# Patient Record
Sex: Male | Born: 1955 | Race: Black or African American | Hispanic: No | Marital: Single | Smoking: Former smoker
Health system: Southern US, Community
[De-identification: ages and names within clinical notes are randomized; demographics above are authoritative.]

## PROBLEM LIST (undated history)

## (undated) DIAGNOSIS — I5022 Chronic systolic (congestive) heart failure: Secondary | ICD-10-CM

## (undated) DIAGNOSIS — I10 Essential (primary) hypertension: Secondary | ICD-10-CM

## (undated) DIAGNOSIS — I428 Other cardiomyopathies: Secondary | ICD-10-CM

## (undated) DIAGNOSIS — N189 Chronic kidney disease, unspecified: Secondary | ICD-10-CM

## (undated) HISTORY — PX: SHOULDER SURGERY: SHX246

## (undated) HISTORY — PX: KNEE SURGERY: SHX244

## (undated) HISTORY — DX: Chronic kidney disease, unspecified: N18.9

## (undated) HISTORY — DX: Chronic systolic (congestive) heart failure: I50.22

## (undated) HISTORY — DX: Other cardiomyopathies: I42.8

---

## 2019-09-19 ENCOUNTER — Inpatient Hospital Stay (HOSPITAL_COMMUNITY)
Admission: EM | Admit: 2019-09-19 | Discharge: 2019-10-03 | DRG: 286 | Disposition: A | Discharge status: Skilled Nursing Facility | Payer: BLUE CROSS/BLUE SHIELD | Attending: Internal Medicine | Admitting: Internal Medicine

## 2019-09-19 ENCOUNTER — Encounter (HOSPITAL_COMMUNITY): Payer: Self-pay

## 2019-09-19 ENCOUNTER — Other Ambulatory Visit: Payer: Self-pay

## 2019-09-19 ENCOUNTER — Emergency Department (HOSPITAL_COMMUNITY): Payer: BLUE CROSS/BLUE SHIELD

## 2019-09-19 DIAGNOSIS — I472 Ventricular tachycardia: Secondary | ICD-10-CM | POA: Diagnosis not present

## 2019-09-19 DIAGNOSIS — R52 Pain, unspecified: Secondary | ICD-10-CM

## 2019-09-19 DIAGNOSIS — N1831 Chronic kidney disease, stage 3a: Secondary | ICD-10-CM | POA: Diagnosis present

## 2019-09-19 DIAGNOSIS — Z6841 Body Mass Index (BMI) 40.0 and over, adult: Secondary | ICD-10-CM

## 2019-09-19 DIAGNOSIS — M25529 Pain in unspecified elbow: Secondary | ICD-10-CM

## 2019-09-19 DIAGNOSIS — M25461 Effusion, right knee: Secondary | ICD-10-CM

## 2019-09-19 DIAGNOSIS — R188 Other ascites: Secondary | ICD-10-CM | POA: Diagnosis present

## 2019-09-19 DIAGNOSIS — R0602 Shortness of breath: Secondary | ICD-10-CM | POA: Diagnosis not present

## 2019-09-19 DIAGNOSIS — I420 Dilated cardiomyopathy: Secondary | ICD-10-CM | POA: Diagnosis present

## 2019-09-19 DIAGNOSIS — R7989 Other specified abnormal findings of blood chemistry: Secondary | ICD-10-CM

## 2019-09-19 DIAGNOSIS — Z833 Family history of diabetes mellitus: Secondary | ICD-10-CM

## 2019-09-19 DIAGNOSIS — E873 Alkalosis: Secondary | ICD-10-CM | POA: Diagnosis present

## 2019-09-19 DIAGNOSIS — Z87891 Personal history of nicotine dependence: Secondary | ICD-10-CM

## 2019-09-19 DIAGNOSIS — I272 Pulmonary hypertension, unspecified: Secondary | ICD-10-CM | POA: Diagnosis present

## 2019-09-19 DIAGNOSIS — N179 Acute kidney failure, unspecified: Secondary | ICD-10-CM

## 2019-09-19 DIAGNOSIS — I5082 Biventricular heart failure: Secondary | ICD-10-CM | POA: Diagnosis present

## 2019-09-19 DIAGNOSIS — Z79899 Other long term (current) drug therapy: Secondary | ICD-10-CM

## 2019-09-19 DIAGNOSIS — R339 Retention of urine, unspecified: Secondary | ICD-10-CM | POA: Diagnosis present

## 2019-09-19 DIAGNOSIS — I5043 Acute on chronic combined systolic (congestive) and diastolic (congestive) heart failure: Secondary | ICD-10-CM | POA: Diagnosis present

## 2019-09-19 DIAGNOSIS — Z9114 Patient's other noncompliance with medication regimen: Secondary | ICD-10-CM

## 2019-09-19 DIAGNOSIS — R32 Unspecified urinary incontinence: Secondary | ICD-10-CM | POA: Diagnosis present

## 2019-09-19 DIAGNOSIS — I509 Heart failure, unspecified: Secondary | ICD-10-CM

## 2019-09-19 DIAGNOSIS — J9621 Acute and chronic respiratory failure with hypoxia: Secondary | ICD-10-CM | POA: Diagnosis present

## 2019-09-19 DIAGNOSIS — Z20822 Contact with and (suspected) exposure to covid-19: Secondary | ICD-10-CM | POA: Diagnosis present

## 2019-09-19 DIAGNOSIS — I5021 Acute systolic (congestive) heart failure: Secondary | ICD-10-CM

## 2019-09-19 DIAGNOSIS — I10 Essential (primary) hypertension: Secondary | ICD-10-CM

## 2019-09-19 DIAGNOSIS — M109 Gout, unspecified: Secondary | ICD-10-CM | POA: Diagnosis present

## 2019-09-19 DIAGNOSIS — N289 Disorder of kidney and ureter, unspecified: Secondary | ICD-10-CM

## 2019-09-19 DIAGNOSIS — I130 Hypertensive heart and chronic kidney disease with heart failure and stage 1 through stage 4 chronic kidney disease, or unspecified chronic kidney disease: Secondary | ICD-10-CM | POA: Diagnosis not present

## 2019-09-19 DIAGNOSIS — E662 Morbid (severe) obesity with alveolar hypoventilation: Secondary | ICD-10-CM | POA: Diagnosis present

## 2019-09-19 DIAGNOSIS — E875 Hyperkalemia: Secondary | ICD-10-CM | POA: Diagnosis present

## 2019-09-19 DIAGNOSIS — E876 Hypokalemia: Secondary | ICD-10-CM | POA: Diagnosis present

## 2019-09-19 DIAGNOSIS — J9622 Acute and chronic respiratory failure with hypercapnia: Secondary | ICD-10-CM | POA: Diagnosis present

## 2019-09-19 DIAGNOSIS — R17 Unspecified jaundice: Secondary | ICD-10-CM

## 2019-09-19 HISTORY — DX: Essential (primary) hypertension: I10

## 2019-09-19 HISTORY — DX: Morbid (severe) obesity due to excess calories: E66.01

## 2019-09-19 LAB — CBC
HCT: 51.2 % (ref 39.0–52.0)
Hemoglobin: 16 g/dL (ref 13.0–17.0)
MCH: 28.7 pg (ref 26.0–34.0)
MCHC: 31.3 g/dL (ref 30.0–36.0)
MCV: 91.8 fL (ref 80.0–100.0)
Platelets: 288 10*3/uL (ref 150–400)
RBC: 5.58 MIL/uL (ref 4.22–5.81)
RDW: 16.6 % — ABNORMAL HIGH (ref 11.5–15.5)
WBC: 5.1 10*3/uL (ref 4.0–10.5)
nRBC: 0 % (ref 0.0–0.2)

## 2019-09-19 LAB — COMPREHENSIVE METABOLIC PANEL
ALT: 21 U/L (ref 0–44)
AST: 44 U/L — ABNORMAL HIGH (ref 15–41)
Albumin: 3.7 g/dL (ref 3.5–5.0)
Alkaline Phosphatase: 66 U/L (ref 38–126)
Anion gap: 10 (ref 5–15)
BUN: 26 mg/dL — ABNORMAL HIGH (ref 8–23)
CO2: 27 mmol/L (ref 22–32)
Calcium: 8.7 mg/dL — ABNORMAL LOW (ref 8.9–10.3)
Chloride: 102 mmol/L (ref 98–111)
Creatinine, Ser: 2.07 mg/dL — ABNORMAL HIGH (ref 0.61–1.24)
GFR calc Af Amer: 39 mL/min — ABNORMAL LOW (ref >60)
GFR calc non Af Amer: 34 mL/min — ABNORMAL LOW (ref >60)
Glucose, Bld: 79 mg/dL (ref 70–99)
Potassium: 5.4 mmol/L — ABNORMAL HIGH (ref 3.5–5.1)
Sodium: 139 mmol/L (ref 135–145)
Total Bilirubin: 2.1 mg/dL — ABNORMAL HIGH (ref 0.3–1.2)
Total Protein: 7.3 g/dL (ref 6.5–8.1)

## 2019-09-19 LAB — LIPASE, BLOOD: Lipase: 36 U/L (ref 11–51)

## 2019-09-19 LAB — BRAIN NATRIURETIC PEPTIDE: B Natriuretic Peptide: 1446.6 pg/mL — ABNORMAL HIGH (ref 0.0–100.0)

## 2019-09-19 MED ORDER — FUROSEMIDE 10 MG/ML IJ SOLN
40.0000 mg | Freq: Once | INTRAMUSCULAR | Status: AC
  Administered 2019-09-19: 40 mg via INTRAVENOUS
  Filled 2019-09-19: qty 4

## 2019-09-19 MED ORDER — SODIUM ZIRCONIUM CYCLOSILICATE 10 G PO PACK
10.0000 g | PACK | Freq: Every day | ORAL | Status: DC
  Administered 2019-09-20 – 2019-09-21 (×2): 10 g via ORAL
  Filled 2019-09-19 (×4): qty 1

## 2019-09-19 NOTE — ED Notes (Signed)
Patient assisted back to room.

## 2019-09-19 NOTE — ED Notes (Addendum)
Patient still in bathroom. Will continue to monitor.

## 2019-09-19 NOTE — ED Provider Notes (Signed)
[facility] DEPT Provider Note   CSN: [id number] Arrival date & time: 09/19/19  1940     History Chief Complaint  Patient presents with  . Diarrhea  . Leg Swelling  . Abnormal ECG    Jack Chavez is a 64 y.o. male.  The history is provided by the patient. No language interpreter was used.     64 year old male presenting for evaluation of swelling.  Patient report for the past 2 weeks he has had some recurrent diarrhea and sensation of indigestion.  Furthermore, he also noticed increased fluid retention around his abdomen and his lower extremities.  Clothes are feeling tight.  Endorsed mild shortness of breath with movement.  Does not complain of any fever chills no lightheadedness or dizziness no chest pain no productive cough not really any significant pain.  He admits he has been without his diuretic for the past 1 month.  He was taking HCTZ.  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  He denies any alcohol abuse tobacco abuse.  He denies any significant cardiac disease.  He went to urgent care today for evaluation of swelling but was told that they were unable to obtain his blood work and his EKG is abnormal and recommend patient come to the ER.  Denies any nausea or vomiting.  History reviewed. No pertinent past medical history.  There are no problems to display for this patient.   History reviewed. No pertinent surgical history.     History reviewed. No pertinent family history.  Social History   Tobacco Use  . Smoking status: Not on file  Substance Use Topics  . Alcohol use: Not on file  . Drug use: Not on file    Home Medications Prior to Admission medications   Not on File    Allergies    Patient has no allergy information on record.  Review of Systems   Review of Systems  All other systems reviewed and are negative.   Physical Exam Updated Vital Signs BP (!) 180/119 (BP Location:  Right Wrist)   Pulse 98   Temp 97.8 F (36.6 C) (Oral)   Resp (!) 22   SpO2 98%   Physical Exam Vitals and nursing note reviewed.  Constitutional:      General: He is not in acute distress.    Appearance: He is well-developed. He is obese.  HENT:     Head: Atraumatic.  Eyes:     Conjunctiva/sclera: Conjunctivae normal.  Neck:     Comments: No JVD Cardiovascular:     Rate and Rhythm: Normal rate and regular rhythm.     Pulses: Normal pulses.     Heart sounds: Normal heart sounds.  Pulmonary:     Effort: Pulmonary effort is normal.     Breath sounds: Normal breath sounds.     Comments: Decreased breath sounds without any overt wheezes, rales, or rhonchi Abdominal:     General: There is distension.     Tenderness: There is no abdominal tenderness.     Comments: Distended abdomen without tenderness.  Reducible umbilical hernia.  Musculoskeletal:        General: Swelling (2+ pitting edema to bilateral lower extremity extending towards the knee.  Intact dorsalis pedis pulse.) present.     Cervical back: Neck supple.  Skin:    Findings: No rash.  Neurological:     Mental Status: He is alert.     ED  Results / Procedures / Treatments   Labs (all labs ordered are listed, but only abnormal results are displayed) Labs Reviewed  COMPREHENSIVE METABOLIC PANEL - Abnormal; Notable for the following components:      Result Value   Potassium 5.4 (*)    BUN 26 (*)    Creatinine, Ser 2.07 (*)    Calcium 8.7 (*)    AST 44 (*)    Total Bilirubin 2.1 (*)    GFR calc non Af Amer 34 (*)    GFR calc Af Amer 39 (*)    All other components within normal limits  CBC - Abnormal; Notable for the following components:   RDW 16.6 (*)    All other components within normal limits  BRAIN NATRIURETIC PEPTIDE - Abnormal; Notable for the following components:   B Natriuretic Peptide 1,446.6 (*)    All other components within normal limits  SARS CORONAVIRUS 2 (TAT 6-24 HRS)  LIPASE, BLOOD    URINALYSIS, ROUTINE W REFLEX MICROSCOPIC    EKG EKG Interpretation  Date/Time:  Wednesday September 19 2019 20:28:30 EDT Ventricular Rate:  95 PR Interval:    QRS Duration: 103 QT Interval:  376 QTC Calculation: 473 R Axis:   175 Text Interpretation: Sinus rhythm Anterior infarct, old No old tracing to compare Confirmed by Meridee Score [phone]) on 09/19/2019 8:54:12 PM   Radiology DG Chest Portable 1 View  Result Date: 09/19/2019 CLINICAL DATA:  Fluid retention, edema EXAM: PORTABLE CHEST 1 VIEW COMPARISON:  None FINDINGS: Cardiomegaly. Right lower lobe airspace opacity with probable layering effusion. No confluent opacity on the left. No acute bony abnormality. IMPRESSION: Layering right pleural effusion with right lower lobe airspace opacity which could reflect atelectasis or pneumonia. Cardiomegaly. Electronically Signed   By: Charlett Nose M.D.   On: 09/19/2019 21:33    Procedures Procedures (including critical care time)  Medications Ordered in ED Medications  sodium zirconium cyclosilicate (LOKELMA) packet 10 g (10 g Oral Given 09/20/19 0005)  furosemide (LASIX) injection 40 mg (40 mg Intravenous Given 09/19/19 2220)    ED Course  I have reviewed the triage vital signs and the nursing notes.  Pertinent labs & imaging results that were available during my care of the patient were reviewed by me and considered in my medical decision making (see chart for details).  Clinical Course as of Mar 25 0002  Wed Sep 19, 2019  7432 64 year old male with history of fluid retention here with increased peripheral edema through his legs and abdomen and increased shortness of breath.  Distant lung sounds.  Morbidly obese.  Moderate edema of legs with some erythema.  Afebrile and normal white count.  BNP elevated and chest x-ray showing signs of pleural effusion and fluid overload.  Will need admission for diuresis and further work-up.   [MB]    Clinical Course User Index [MB] Terrilee Files, MD   MDM Rules/Calculators/A&P                      BP (!) 180/119 (BP Location: Right Wrist)   Pulse 98   Temp 97.8 F (36.6 C) (Oral)   Resp (!) 22   SpO2 98%   Final Clinical Impression(s) / ED Diagnoses Final diagnoses:  Acute on chronic congestive heart failure, unspecified heart failure type (HCC)    Rx / DC Orders ED Discharge Orders    None     9:20 PM Patient report fluid retention around his abdomen and his  lower extremities for over a week.  Patient appears to be fluid overload, suspect component of CHF.  No known history of CHF in the past.  Has not been taking his diuretic for the past month due to no current PCP.  He is from [place].  Recently moved here.  No active chest pain.  Diarrhea as mild and recurrent no recent antibiotic use.  Care discussed with Dr. Charm Barges.   12:05 AM Labs remarkable for an elevated potassium of 5.4, will give Lokelma.  BUN is 26, creatinine of 2.07, no prior value for comparison.  He has a markedly elevated BNP of 1446.  Chest x-ray shows layering right pleural effusion with right lower lobe airspace opacity which could reflect atelectasis versus pneumonia.  Patient does not endorse any cough or fever to suggest pneumonia.  Since he does not have a PCP and currently presenting with signs of CHF exacerbation, will consult for admission.  Lasix given.  Patient voiced understanding and agrees with plan.  COVID-19 screening test ordered.  Jack Chavez was evaluated in Emergency Department on 09/20/2019 for the symptoms described in the history of present illness. He was evaluated in the context of the global COVID-19 pandemic, which necessitated consideration that the patient might be at risk for infection with the SARS-CoV-2 virus that causes COVID-19. Institutional protocols and algorithms that pertain to the evaluation of patients at risk for COVID-19 are in a state of rapid change based on information released by regulatory bodies  including the CDC and federal and state organizations. These policies and algorithms were followed during the patient's care in the ED.  12:16 AM Pt sign out to oncoming team who will consult medicine for admission.    Fayrene Helper, PA-C 09/20/19 0017    Terrilee Files, MD 09/20/19 1005

## 2019-09-19 NOTE — ED Triage Notes (Signed)
Pt reports he has been retaining fluid x3 weeks. Pt reports going to a clinic prior to coming here and they sent him here due to being unable to get blood work, has low urine output, and an abnormal EKG.

## 2019-09-19 NOTE — ED Notes (Addendum)
Patient assisted to bathroom in wheelchair. Will continue to monitor.

## 2019-09-19 NOTE — ED Notes (Signed)
This RN attempted to establish a line unsuccessfully. Another RN will attempt at this time. Will continue to monitor.

## 2019-09-20 ENCOUNTER — Inpatient Hospital Stay (HOSPITAL_COMMUNITY): Payer: BLUE CROSS/BLUE SHIELD

## 2019-09-20 ENCOUNTER — Encounter (HOSPITAL_COMMUNITY): Payer: Self-pay | Admitting: Internal Medicine

## 2019-09-20 DIAGNOSIS — R188 Other ascites: Secondary | ICD-10-CM | POA: Diagnosis present

## 2019-09-20 DIAGNOSIS — R17 Unspecified jaundice: Secondary | ICD-10-CM

## 2019-09-20 DIAGNOSIS — E875 Hyperkalemia: Secondary | ICD-10-CM | POA: Diagnosis present

## 2019-09-20 DIAGNOSIS — E662 Morbid (severe) obesity with alveolar hypoventilation: Secondary | ICD-10-CM | POA: Diagnosis present

## 2019-09-20 DIAGNOSIS — Z20822 Contact with and (suspected) exposure to covid-19: Secondary | ICD-10-CM | POA: Diagnosis present

## 2019-09-20 DIAGNOSIS — J9622 Acute and chronic respiratory failure with hypercapnia: Secondary | ICD-10-CM | POA: Diagnosis present

## 2019-09-20 DIAGNOSIS — I5023 Acute on chronic systolic (congestive) heart failure: Secondary | ICD-10-CM | POA: Diagnosis not present

## 2019-09-20 DIAGNOSIS — R0602 Shortness of breath: Secondary | ICD-10-CM | POA: Diagnosis present

## 2019-09-20 DIAGNOSIS — I509 Heart failure, unspecified: Secondary | ICD-10-CM | POA: Diagnosis not present

## 2019-09-20 DIAGNOSIS — M25461 Effusion, right knee: Secondary | ICD-10-CM | POA: Diagnosis not present

## 2019-09-20 DIAGNOSIS — J9621 Acute and chronic respiratory failure with hypoxia: Secondary | ICD-10-CM | POA: Diagnosis present

## 2019-09-20 DIAGNOSIS — E876 Hypokalemia: Secondary | ICD-10-CM

## 2019-09-20 DIAGNOSIS — Z79899 Other long term (current) drug therapy: Secondary | ICD-10-CM | POA: Diagnosis not present

## 2019-09-20 DIAGNOSIS — E873 Alkalosis: Secondary | ICD-10-CM | POA: Diagnosis present

## 2019-09-20 DIAGNOSIS — I130 Hypertensive heart and chronic kidney disease with heart failure and stage 1 through stage 4 chronic kidney disease, or unspecified chronic kidney disease: Secondary | ICD-10-CM | POA: Diagnosis present

## 2019-09-20 DIAGNOSIS — I5021 Acute systolic (congestive) heart failure: Secondary | ICD-10-CM | POA: Diagnosis not present

## 2019-09-20 DIAGNOSIS — N289 Disorder of kidney and ureter, unspecified: Secondary | ICD-10-CM

## 2019-09-20 DIAGNOSIS — R339 Retention of urine, unspecified: Secondary | ICD-10-CM | POA: Diagnosis present

## 2019-09-20 DIAGNOSIS — I10 Essential (primary) hypertension: Secondary | ICD-10-CM | POA: Diagnosis not present

## 2019-09-20 DIAGNOSIS — Z833 Family history of diabetes mellitus: Secondary | ICD-10-CM | POA: Diagnosis not present

## 2019-09-20 DIAGNOSIS — Z6841 Body Mass Index (BMI) 40.0 and over, adult: Secondary | ICD-10-CM | POA: Diagnosis not present

## 2019-09-20 DIAGNOSIS — N179 Acute kidney failure, unspecified: Secondary | ICD-10-CM | POA: Diagnosis present

## 2019-09-20 DIAGNOSIS — I5043 Acute on chronic combined systolic (congestive) and diastolic (congestive) heart failure: Secondary | ICD-10-CM | POA: Diagnosis present

## 2019-09-20 DIAGNOSIS — I472 Ventricular tachycardia: Secondary | ICD-10-CM | POA: Diagnosis not present

## 2019-09-20 DIAGNOSIS — N170 Acute kidney failure with tubular necrosis: Secondary | ICD-10-CM | POA: Diagnosis not present

## 2019-09-20 DIAGNOSIS — I420 Dilated cardiomyopathy: Secondary | ICD-10-CM | POA: Diagnosis present

## 2019-09-20 DIAGNOSIS — Z87891 Personal history of nicotine dependence: Secondary | ICD-10-CM | POA: Diagnosis not present

## 2019-09-20 DIAGNOSIS — I272 Pulmonary hypertension, unspecified: Secondary | ICD-10-CM | POA: Diagnosis present

## 2019-09-20 DIAGNOSIS — M109 Gout, unspecified: Secondary | ICD-10-CM | POA: Diagnosis present

## 2019-09-20 DIAGNOSIS — N1831 Chronic kidney disease, stage 3a: Secondary | ICD-10-CM | POA: Diagnosis present

## 2019-09-20 DIAGNOSIS — Z9114 Patient's other noncompliance with medication regimen: Secondary | ICD-10-CM | POA: Diagnosis not present

## 2019-09-20 DIAGNOSIS — R32 Unspecified urinary incontinence: Secondary | ICD-10-CM | POA: Diagnosis present

## 2019-09-20 DIAGNOSIS — I5082 Biventricular heart failure: Secondary | ICD-10-CM | POA: Diagnosis present

## 2019-09-20 LAB — URINALYSIS, ROUTINE W REFLEX MICROSCOPIC
Bilirubin Urine: NEGATIVE
Glucose, UA: NEGATIVE mg/dL
Ketones, ur: NEGATIVE mg/dL
Leukocytes,Ua: NEGATIVE
Nitrite: NEGATIVE
Protein, ur: 100 mg/dL — AB
Specific Gravity, Urine: 1.009 (ref 1.005–1.030)
pH: 5 (ref 5.0–8.0)

## 2019-09-20 LAB — RENAL FUNCTION PANEL
Albumin: 3.2 g/dL — ABNORMAL LOW (ref 3.5–5.0)
Anion gap: 8 (ref 5–15)
BUN: 28 mg/dL — ABNORMAL HIGH (ref 8–23)
CO2: 32 mmol/L (ref 22–32)
Calcium: 8.5 mg/dL — ABNORMAL LOW (ref 8.9–10.3)
Chloride: 102 mmol/L (ref 98–111)
Creatinine, Ser: 1.95 mg/dL — ABNORMAL HIGH (ref 0.61–1.24)
GFR calc Af Amer: 42 mL/min — ABNORMAL LOW (ref >60)
GFR calc non Af Amer: 36 mL/min — ABNORMAL LOW (ref >60)
Glucose, Bld: 101 mg/dL — ABNORMAL HIGH (ref 70–99)
Phosphorus: 4.5 mg/dL (ref 2.5–4.6)
Potassium: 4.5 mmol/L (ref 3.5–5.1)
Sodium: 142 mmol/L (ref 135–145)

## 2019-09-20 LAB — BASIC METABOLIC PANEL
Anion gap: 13 (ref 5–15)
BUN: 27 mg/dL — ABNORMAL HIGH (ref 8–23)
CO2: 28 mmol/L (ref 22–32)
Calcium: 8.5 mg/dL — ABNORMAL LOW (ref 8.9–10.3)
Chloride: 99 mmol/L (ref 98–111)
Creatinine, Ser: 2.01 mg/dL — ABNORMAL HIGH (ref 0.61–1.24)
GFR calc Af Amer: 40 mL/min — ABNORMAL LOW (ref >60)
GFR calc non Af Amer: 35 mL/min — ABNORMAL LOW (ref >60)
Glucose, Bld: 72 mg/dL (ref 70–99)
Potassium: 5.2 mmol/L — ABNORMAL HIGH (ref 3.5–5.1)
Sodium: 140 mmol/L (ref 135–145)

## 2019-09-20 LAB — NA AND K (SODIUM & POTASSIUM), RAND UR
Potassium Urine: 17 mmol/L
Sodium, Ur: 115 mmol/L

## 2019-09-20 LAB — PROCALCITONIN: Procalcitonin: <0.1 ng/mL

## 2019-09-20 LAB — CREATININE, URINE, RANDOM: Creatinine, Urine: 24.8 mg/dL

## 2019-09-20 LAB — SARS CORONAVIRUS 2 (TAT 6-24 HRS): SARS Coronavirus 2: NEGATIVE

## 2019-09-20 LAB — HIV ANTIBODY (ROUTINE TESTING W REFLEX): HIV Screen 4th Generation wRfx: NONREACTIVE

## 2019-09-20 LAB — ECHOCARDIOGRAM COMPLETE

## 2019-09-20 MED ORDER — SODIUM CHLORIDE 0.9 % IV SOLN: 250.0000 mL | INTRAVENOUS | Status: DC | PRN

## 2019-09-20 MED ORDER — HEPARIN SODIUM (PORCINE) 5000 UNIT/ML IJ SOLN
5000.0000 [IU] | Freq: Three times a day (TID) | INTRAMUSCULAR | Status: DC
  Administered 2019-09-20 – 2019-09-29 (×27): 5000 [IU] via SUBCUTANEOUS
  Filled 2019-09-20 (×27): qty 1

## 2019-09-20 MED ORDER — SODIUM CHLORIDE 0.9% FLUSH: 3.0000 mL | INTRAVENOUS | Status: DC | PRN

## 2019-09-20 MED ORDER — FUROSEMIDE 10 MG/ML IJ SOLN
40.0000 mg | Freq: Two times a day (BID) | INTRAMUSCULAR | Status: DC
  Administered 2019-09-20 – 2019-09-21 (×3): 40 mg via INTRAVENOUS
  Filled 2019-09-20 (×3): qty 4

## 2019-09-20 MED ORDER — SODIUM CHLORIDE 0.9% FLUSH
3.0000 mL | Freq: Two times a day (BID) | INTRAVENOUS | Status: DC
  Administered 2019-09-20 – 2019-10-02 (×23): 3 mL via INTRAVENOUS

## 2019-09-20 MED ORDER — ACETAMINOPHEN 325 MG PO TABS
650.0000 mg | ORAL_TABLET | ORAL | Status: DC | PRN
  Administered 2019-09-22 – 2019-10-03 (×4): 650 mg via ORAL
  Filled 2019-09-20 (×4): qty 2

## 2019-09-20 MED ORDER — AMLODIPINE BESYLATE 10 MG PO TABS
10.0000 mg | ORAL_TABLET | Freq: Every day | ORAL | Status: DC
  Administered 2019-09-20: 10 mg via ORAL
  Filled 2019-09-20: qty 2

## 2019-09-20 NOTE — Consult Note (Addendum)
Reason for Consult: AKI Referring Physician: Sunnie Nielsen, MD  Jack Chavez is an 64 y.o. male.  HPI: Jack Chavez has a PMH significant for HTN and morbid obesity who presented to [facility] ED with a 10 day history of worsening shortness breath, DOE, lower extremity edema, and abdominal distention.  He moved to from [place] to [place] 3 months ago and has not established medical care yet and ran out of his diuretic a month ago.  He also reported diarrhea for the past week.  In the ED he was noted to be hypertensive at 180/119, CXR with right pleural effusion, on exam was noted to have anasarca.  Labs notable for K of 5.4, BUN 26, Cr of 2, and elevated BNP at 1446.  He was admitted for IV diuresis and we were consulted to further evaluate his possible AKI.  He denies ever being told that he had CKD nor does he have a family history of CKD.  He did state that he has been taking diclofenac 75 mg twice daily for the past year due to joint pains.  He is not on an ACE or ARB.  He stopped the diclofenac 2 weeks ago when Urgent care was concerned about his "liver".    Trend in Creatinine: Creatinine, Ser  Date/Time Value Ref Range Status  09/20/2019 02:53 PM 1.95 (H) 0.61 - 1.24 mg/dL Final  16/03/9603 54:09 AM 2.01 (H) 0.61 - 1.24 mg/dL Final  81/19/1478 29:56 PM 2.07 (H) 0.61 - 1.24 mg/dL Final    PMH:   Past Medical History:  Diagnosis Date  . HTN (hypertension)     PSH:   Past Surgical History:  Procedure Laterality Date  . KNEE SURGERY    . SHOULDER SURGERY      Allergies:  Allergies  Allergen Reactions  . Morphine And Related     Patient reports "stomach tightness" in the past related to morphine use and does not remember what other reactions it caused.    Medications:   Prior to Admission medications   Medication Sig Start Date End Date Taking? Authorizing Provider  amLODipine (NORVASC) 10 MG tablet Take 10 mg by mouth daily.   Yes [provider]  co-enzyme Q-10 30 MG capsule  Take 30 mg by mouth daily.   Yes [provider]  Cyanocobalamin (VITAMIN B12) 3000 MCG/ML LIQD Place 1 mL under the tongue daily.   Yes [provider]  Garlic 10 MG CAPS Take 2 capsules by mouth in the morning and at bedtime.   Yes [provider]  hydrochlorothiazide (HYDRODIURIL) 12.5 MG tablet Take 12.5 mg by mouth daily.   Yes [provider]  Magnesium 500 MG TABS Take 3 tablets by mouth daily.   Yes [provider]  Probiotic Product (PROBIOTIC PO) Take 1 capsule by mouth daily.   Yes [provider]  TURMERIC PO Take 1 capsule by mouth every other day.   Yes [provider]  VITAMIN A PO Take 1 tablet by mouth every other day.   Yes [provider]    Inpatient medications: . amLODipine  10 mg Oral Daily  . furosemide  40 mg Intravenous BID  . heparin  5,000 Units Subcutaneous Q8H  . sodium chloride flush  3 mL Intravenous Q12H  . sodium zirconium cyclosilicate  10 g Oral Daily    Discontinued Meds:  There are no discontinued medications.  Social History:  reports that he has quit smoking. His smoking use included cigars. He has  never used smokeless tobacco. He reports current alcohol use. He reports that he does not use drugs.  Family History:   Family History  Problem Relation Age of Onset  . Diabetes Father   . Diabetes Paternal Grandmother     Pertinent items are noted in HPI. Weight change:   Intake/Output Summary (Last 24 hours) at 09/20/2019 1759 Last data filed at 09/20/2019 1700 Gross per 24 hour  Intake 240 ml  Output --  Net 240 ml   BP 138/75 (BP Location: Left Arm)   Pulse 96   Temp (!) 97.5 F (36.4 C) (Oral)   Resp 18   SpO2 91%  Vitals:   09/20/19 0952 09/20/19 1116 09/20/19 1330 09/20/19 1538  BP:  (!) 164/97 (!) 158/82 138/75  Pulse: 99  96 96  Resp:   19 18  Temp:    (!) 97.5 F (36.4 C)  TempSrc:    Oral  SpO2: 92%  99% 91%     General appearance: alert,  cooperative, no distress and morbidly obese Head: Normocephalic, without obvious abnormality, atraumatic Eyes: negative findings: lids and lashes normal, conjunctivae and sclerae normal and corneas clear Resp: diminished breath sounds bibasilar Cardio: no rub GI: obese, +BS, +pitting edema up to chest, nontender Extremities: edema 3+ anasarca to chest Neurologic: Grossly normal  Labs: Basic Metabolic Panel: Recent Labs  Lab 09/19/19 2100 09/20/19 0317 09/20/19 1453  NA 139 140 142  K 5.4* 5.2* 4.5  CL 102 99 102  CO2 27 28 32  GLUCOSE 79 72 101*  BUN 26* 27* 28*  CREATININE 2.07* 2.01* 1.95*  ALBUMIN 3.7  --  3.2*  CALCIUM 8.7* 8.5* 8.5*  PHOS  --   --  4.5   Liver Function Tests: Recent Labs  Lab 09/19/19 2100 09/20/19 1453  AST 44*  --   ALT 21  --   ALKPHOS 66  --   BILITOT 2.1*  --   PROT 7.3  --   ALBUMIN 3.7 3.2*   Recent Labs  Lab 09/19/19 2100  LIPASE 36   No results for input(s): AMMONIA in the last 168 hours. CBC: Recent Labs  Lab 09/19/19 2100  WBC 5.1  HGB 16.0  HCT 51.2  MCV 91.8  PLT 288   PT/INR: @LABRCNTIP (inr:5) Cardiac Enzymes: )No results for input(s): CKTOTAL, CKMB, CKMBINDEX, TROPONINI in the last 168 hours. CBG: No results for input(s): GLUCAP in the last 168 hours.  Iron Studies: No results for input(s): IRON, TIBC, TRANSFERRIN, FERRITIN in the last 168 hours.  Xrays/Other Studies: DG Chest Portable 1 View  Result Date: 09/19/2019 CLINICAL DATA:  Fluid retention, edema EXAM: PORTABLE CHEST 1 VIEW COMPARISON:  None FINDINGS: Cardiomegaly. Right lower lobe airspace opacity with probable layering effusion. No confluent opacity on the left. No acute bony abnormality. IMPRESSION: Layering right pleural effusion with right lower lobe airspace opacity which could reflect atelectasis or pneumonia. Cardiomegaly. Electronically Signed   By: 09/21/2019 M.D.   On: 09/19/2019 21:33   ECHOCARDIOGRAM COMPLETE  Result Date: 09/20/2019     ECHOCARDIOGRAM REPORT   Patient Name:   Jack Chavez Date of Exam: 09/20/2019 Medical Rec #:  09/22/2019   Height: Accession #:    [id number]  Weight: Date of Birth:  [birth year]   BSA: Patient Age:    63 years    BP:           142/81 mmHg Patient Gender: M  HR:           94 bpm. Exam Location:  Inpatient Procedure: 2D Echo, Cardiac Doppler and Color Doppler Indications:    CHF-Acute Systolic 428.21  History:        Patient has no prior history of Echocardiogram examinations.                 CHF; Risk Factors:Hypertension and Former Smoker.  Sonographer:    Renella Cunas RDCS Referring Phys: [id number] VASUNDHRA RATHORE IMPRESSIONS  1. Severe global reduction in LV systolic function; moderate LVE; grade 2 diastolic dysfunction; biatrial enlargement; severe RV dysfunction; mild TR with severe pulmonary hypertension.  2. Left ventricular ejection fraction, by estimation, is 20 to 25%. The left ventricle has severely decreased function. The left ventricle demonstrates global hypokinesis. The left ventricular internal cavity size was moderately dilated. Left ventricular diastolic parameters are consistent with Grade II diastolic dysfunction (pseudonormalization). Elevated left atrial pressure.  3. Right ventricular systolic function is severely reduced. The right ventricular size is normal. There is severely elevated pulmonary artery systolic pressure.  4. Left atrial size was mildly dilated.  5. Right atrial size was moderately dilated.  6. The mitral valve is normal in structure. Trivial mitral valve regurgitation. No evidence of mitral stenosis.  7. The aortic valve is tricuspid. Aortic valve regurgitation is not visualized. No aortic stenosis is present.  8. The inferior vena cava is dilated in size with <50% respiratory variability, suggesting right atrial pressure of 15 mmHg. FINDINGS  Left Ventricle: Left ventricular ejection fraction, by estimation, is 20 to 25%. The left ventricle has severely decreased  function. The left ventricle demonstrates global hypokinesis. The left ventricular internal cavity size was moderately dilated. There is no left ventricular hypertrophy. Left ventricular diastolic parameters are consistent with Grade II diastolic dysfunction (pseudonormalization). Elevated left atrial pressure. Right Ventricle: The right ventricular size is normal.Right ventricular systolic function is severely reduced. There is severely elevated pulmonary artery systolic pressure. The tricuspid regurgitant velocity is 3.71 m/s, and with an assumed right atrial  pressure of 15 mmHg, the estimated right ventricular systolic pressure is 70.1 mmHg. Left Atrium: Left atrial size was mildly dilated. Right Atrium: Right atrial size was moderately dilated. Pericardium: There is no evidence of pericardial effusion. Mitral Valve: The mitral valve is normal in structure. Normal mobility of the mitral valve leaflets. Trivial mitral valve regurgitation. No evidence of mitral valve stenosis. Tricuspid Valve: The tricuspid valve is normal in structure. Tricuspid valve regurgitation is mild . No evidence of tricuspid stenosis. Aortic Valve: The aortic valve is tricuspid. Aortic valve regurgitation is not visualized. No aortic stenosis is present. Pulmonic Valve: The pulmonic valve was normal in structure. Pulmonic valve regurgitation is mild. No evidence of pulmonic stenosis. Aorta: The aortic root is normal in size and structure. Venous: The inferior vena cava is dilated in size with less than 50% respiratory variability, suggesting right atrial pressure of 15 mmHg. IAS/Shunts: There is left bowing of the interatrial septum, suggestive of elevated right atrial pressure. No atrial level shunt detected by color flow Doppler. Additional Comments: Severe global reduction in LV systolic function; moderate LVE; grade 2 diastolic dysfunction; biatrial enlargement; severe RV dysfunction; mild TR with severe pulmonary hypertension. There  is pleural effusion in the left lateral region.  LEFT VENTRICLE PLAX 2D LVIDd:         6.00 cm      Diastology LVIDs:         5.50 cm  LV e' lateral:   4.68 cm/s LV PW:         0.90 cm      LV E/e' lateral: 17.4 LV IVS:        0.90 cm      LV e' medial:    4.19 cm/s LVOT diam:     2.10 cm      LV E/e' medial:  19.4 LV SV:         43 LVOT Area:     3.46 cm  LV Volumes (MOD) LV vol d, MOD A2C: 175.0 ml LV vol d, MOD A4C: 198.0 ml LV vol s, MOD A2C: 150.0 ml LV vol s, MOD A4C: 148.0 ml LV SV MOD A2C:     25.0 ml LV SV MOD A4C:     198.0 ml LV SV MOD BP:      44.3 ml RIGHT VENTRICLE RV S prime:     9.65 cm/s TAPSE (M-mode): 1.3 cm LEFT ATRIUM             RIGHT ATRIUM LA diam:        5.30 cm RA Area:     30.10 cm LA Vol (A2C):   62.5 ml RA Volume:   106.00 ml LA Vol (A4C):   30.2 ml LA Biplane Vol: 46.3 ml  AORTIC VALVE LVOT Vmax:   80.90 cm/s LVOT Vmean:  50.300 cm/s LVOT VTI:    0.123 m  AORTA Ao Root diam: 3.10 cm MITRAL VALVE               TRICUSPID VALVE MV Area (PHT): 7.16 cm    TR Peak grad:   55.1 mmHg MV Decel Time: 106 msec    TR Vmax:        371.00 cm/s MV E velocity: 81.40 cm/s MV A velocity: 36.80 cm/s  SHUNTS MV E/A ratio:  2.21        Systemic VTI:  0.12 m                            Systemic Diam: 2.10 cm Olga Millers MD Electronically signed by Olga Millers MD Signature Date/Time: 09/20/2019/1:00:43 PM    Final    US Abdomen Limited RUQ  Result Date: 09/20/2019 CLINICAL DATA:  Elevated LFTs. EXAM: ULTRASOUND ABDOMEN LIMITED RIGHT UPPER QUADRANT COMPARISON:  No prior. FINDINGS: Gallbladder: Not visualized Common bile duct: Diameter: Not visualized Liver: Nodular hepatic contour suggesting the possibility of cirrhosis. No definite mass noted. Portal vein not well visualized. Other: Moderate ascites. This exam was extremely limited due to patient's body habitus and overlying bowel gas. IMPRESSION: 1. Very limited exam due to patient's body habitus and overlying bowel gas. Gallbladder and common  bile duct not visualized. 2. Very nodular hepatic contour suggesting cirrhosis. Moderate ascites. Electronically Signed   By: Maisie Fus  Register   On: 09/20/2019 07:35     Assessment/Plan: 1.  AKI vs acute recognition of CKD-  Mr. Rueter is not a good historian regarding his medical history and we do not have any records to review.  He could have NSAID nephropathy but more likely he has CKD due to poorly controlled HTN as well as a component of cardiorenal syndrome.  His Scr has improved with diuresis.   1. Will order renal US to evaluate size and number of kidneys and comment on cortical echogenicity.    2. No indication for dialysis.   3. He will need to  avoid NSAIDs moving forward. 4. Will hold off on GN workup for now given his history 2. Anasarca- initially concerned about cirrhosis, CHF, or nephrotic syndrome but ECHO consistent with biventricular failure/CMP.  Agree with IV diuresis but will need cardiology evaluation. 3. Cardiomyopathy- EF 20-25% along with severely reduced right ventricular systolic function, pulmonary HTN, and Grade II diastolic dysfunction.   Await cardiology evaluation and workup. 4. Suspected Cirrhosis- seen on [place] likely due to CHF 5. Pulmonary HTN- likely has untreated OSA/OHS.  Will need sleep study as an outpatient 6. HTN- bp improved with resumption of outpatient meds and diuresis 7. Diarrhea- enteric precautions and workup pending   Donetta Potts 09/20/2019, 5:59 PM

## 2019-09-20 NOTE — ED Notes (Signed)
Spoke with Weston Brass, Pharmacist, regarding close administration times of pts scheduled Lokelma (given today 0005, ordered again to be given at 1000).  Pharmacist to adjust administration time d/t also being contraindicated in conjunction with other PO meds ordered at same time.

## 2019-09-20 NOTE — ED Provider Notes (Signed)
Patient care signed out by Fayrene Helper, PA-C, at end of shift.   Patient new in [place]  On HCTZ - out for one month To ED with CHF exacerbation Increased fluid for 2 weeks, up to abdomen CXR - pleural effusions (R>L) EKG shows no acute change, no chest pain 5.4 K+ - was treated Lasix 40 mg IV - given Has no PCP for follow up  He appears fluid overloaded, elevated BNP, hypertensive  Plan: admit  Discussed with Dr. Loney Loh, [facility], who accepts the patient for admission.   Elpidio Anis, PA-C 09/20/19 3817    Vanetta Mulders, MD 10/03/19 [phone]

## 2019-09-20 NOTE — Progress Notes (Signed)
  Echocardiogram 2D Echocardiogram has been performed.  Burnard Hawthorne 09/20/2019, 11:51 AM

## 2019-09-20 NOTE — Progress Notes (Signed)
PROGRESS NOTE    Jack Chavez  IOE:[id number] DOB: December 29, 1955 DOA: 09/19/2019 PCP: Patient, No Pcp Per   Brief Narrative: 64 year old with past medical history significant for hypertension morbid obesity who presents complaining of worsening shortness of breath on exertion and peripheral edema.  He reports 10 days history of shortness of breath on exertion.  He also reports abdominal distention.  He ran of his hydrochlorothiazide for a month.  He reports diarrhea.  He went to an urgent care center and was advised to come to the ED due to abnormal kidney function.   Assessment & Plan:   Principal Problem:   Acute exacerbation of CHF (congestive heart failure) (HCC) Active Problems:   Renal insufficiency   Hypokalemia   Serum total bilirubin elevated   Hypertension  1-Acute systolic and diastolic heart failure exacerbation Continue with IV Lasix 40 mg IV twice daily Echocardiogram showed ejection fraction 20 to 25% diastolic dysfunction, generalized hypokinesis.  Cardiology consulted.   2-AKI; no prior baseline.  Continue with lasix.  Nephrology consulted.   3-Diarrhea; report 3 episode watery stool daily.  GI pathogen ordered.   Mild elevation Bili;  [place]; Very nodular hepatic contour suggesting cirrhosis. Moderate ascites. Ascites; might need paracentesis.   HTN; Continue with lasix.   Hyperkalemia; received lokelma   There is no height or weight on file to calculate BMI.   DVT prophylaxis:  Heparin  Code Status: full code Family Communication: care discussed with patient.  Disposition Plan:  Patient is from: home  Anticipated d/c date: in 2--3 days  Barriers to d/c or necessity for inpatient status: remain in the hospital for management of acute Systolic HF, requiring IV lasix.   Consultants:   Cardiology  Nephrology   Procedures:   ECHO  Antimicrobials:    Subjective: He report SOB, denies chest pain.  Report abdominal distension. Denies alcohol use.     Objective: Vitals:   09/20/19 0330 09/20/19 0430 09/20/19 0530 09/20/19 0630  BP: 139/73 (!) 155/97 (!) 156/96 (!) 142/81  Pulse: 96 93 96 94  Resp: 19 20 (!) 21 (!) 21  Temp:      TempSrc:      SpO2: 92% 92% 92% 92%   No intake or output data in the 24 hours ending 09/20/19 0920 There were no vitals filed for this visit.  Examination:  General exam: Appears calm and comfortable  Respiratory system: Clear to auscultation. Respiratory effort normal. Cardiovascular system: S1 & S2 heard, RRR. No JVD, murmurs, rubs, gallops or clicks. No pedal edema. Gastrointestinal system: Abdomen is very distended, soft and nontender. No organomegaly or masses felt. Normal bowel sounds heard. Central nervous system: Alert and oriented. No focal neurological deficits. Extremities: Symmetric 5 x 5 power. Plus 2 edema Skin: No rashes, lesions or ulcers    Data Reviewed: I have personally reviewed following labs and imaging studies  CBC: Recent Labs  Lab 09/19/19 2100  WBC 5.1  HGB 16.0  HCT 51.2  MCV 91.8  PLT 288   Basic Metabolic Panel: Recent Labs  Lab 09/19/19 2100 09/20/19 0317  NA 139 140  K 5.4* 5.2*  CL 102 99  CO2 27 28  GLUCOSE 79 72  BUN 26* 27*  CREATININE 2.07* 2.01*  CALCIUM 8.7* 8.5*   GFR: CrCl cannot be calculated (Unknown ideal weight.). Liver Function Tests: Recent Labs  Lab 09/19/19 2100  AST 44*  ALT 21  ALKPHOS 66  BILITOT 2.1*  PROT 7.3  ALBUMIN 3.7   Recent  Labs  Lab 09/19/19 2100  LIPASE 36   No results for input(s): AMMONIA in the last 168 hours. Coagulation Profile: No results for input(s): INR, PROTIME in the last 168 hours. Cardiac Enzymes: No results for input(s): CKTOTAL, CKMB, CKMBINDEX, TROPONINI in the last 168 hours. BNP (last 3 results) No results for input(s): PROBNP in the last 8760 hours. HbA1C: No results for input(s): HGBA1C in the last 72 hours. CBG: No results for input(s): GLUCAP in the last 168 hours. Lipid  Profile: No results for input(s): CHOL, HDL, LDLCALC, TRIG, CHOLHDL, LDLDIRECT in the last 72 hours. Thyroid Function Tests: No results for input(s): TSH, T4TOTAL, FREET4, T3FREE, THYROIDAB in the last 72 hours. Anemia Panel: No results for input(s): VITAMINB12, FOLATE, FERRITIN, TIBC, IRON, RETICCTPCT in the last 72 hours. Sepsis Labs: Recent Labs  Lab 09/20/19 0317  PROCALCITON <0.10    Recent Results (from the past 240 hour(s))  SARS CORONAVIRUS 2 (TAT 6-24 HRS) Nasopharyngeal Nasopharyngeal Swab     Status: None   Collection Time: 09/19/19 11:38 PM   Specimen: Nasopharyngeal Swab  Result Value Ref Range Status   SARS Coronavirus 2 NEGATIVE NEGATIVE Final    Comment: (NOTE) SARS-CoV-2 target nucleic acids are NOT DETECTED. The SARS-CoV-2 RNA is generally detectable in upper and lower respiratory specimens during the acute phase of infection. Negative results do not preclude SARS-CoV-2 infection, do not rule out co-infections with other pathogens, and should not be used as the sole basis for treatment or other patient management decisions. Negative results must be combined with clinical observations, patient history, and epidemiological information. The expected result is Negative. Fact Sheet for Patients: HairSlick.no Fact Sheet for Healthcare Providers: quierodirigir.com This test is not yet approved or cleared by the [place] FDA and  has been authorized for detection and/or diagnosis of SARS-CoV-2 by FDA under an Emergency Use Authorization (EUA). This EUA will remain  in effect (meaning this test can be used) for the duration of the COVID-19 declaration under Section 56 4(b)(1) of the Act, 21 U.S.C. section 360bbb-3(b)(1), unless the authorization is terminated or revoked sooner. Performed at [facility], [address], [place], [place] [address]          Radiology Studies: DG Chest Portable 1  View  Result Date: 09/19/2019 CLINICAL DATA:  Fluid retention, edema EXAM: PORTABLE CHEST 1 VIEW COMPARISON:  None FINDINGS: Cardiomegaly. Right lower lobe airspace opacity with probable layering effusion. No confluent opacity on the left. No acute bony abnormality. IMPRESSION: Layering right pleural effusion with right lower lobe airspace opacity which could reflect atelectasis or pneumonia. Cardiomegaly. Electronically Signed   By: Charlett Nose M.D.   On: 09/19/2019 21:33   US Abdomen Limited RUQ  Result Date: 09/20/2019 CLINICAL DATA:  Elevated LFTs. EXAM: ULTRASOUND ABDOMEN LIMITED RIGHT UPPER QUADRANT COMPARISON:  No prior. FINDINGS: Gallbladder: Not visualized Common bile duct: Diameter: Not visualized Liver: Nodular hepatic contour suggesting the possibility of cirrhosis. No definite mass noted. Portal vein not well visualized. Other: Moderate ascites. This exam was extremely limited due to patient's body habitus and overlying bowel gas. IMPRESSION: 1. Very limited exam due to patient's body habitus and overlying bowel gas. Gallbladder and common bile duct not visualized. 2. Very nodular hepatic contour suggesting cirrhosis. Moderate ascites. Electronically Signed   By: Maisie Fus  Register   On: 09/20/2019 07:35        Scheduled Meds: . amLODipine  10 mg Oral Daily  . furosemide  40 mg Intravenous BID  .  heparin  5,000 Units Subcutaneous Q8H  . sodium chloride flush  3 mL Intravenous Q12H  . sodium zirconium cyclosilicate  10 g Oral Daily   Continuous Infusions: . sodium chloride       LOS: 0 days    Time spent: 35 minutes    Azya Barbero A Lucresha Dismuke, MD [facility]   If 7PM-7AM, please contact night-coverage [email or web address]  09/20/2019, 9:20 AM

## 2019-09-20 NOTE — ED Notes (Signed)
Pt provided with lunch tray. Pt also given hygiene supplies, asked to let staff know when he wanted assistance to get cleaned up.

## 2019-09-20 NOTE — H&P (Signed)
History and Physical    Ajai Harville BDZ:[id number] DOB: 1955-07-01 DOA: 09/19/2019  PCP: Patient, No Pcp Per  Chief Complaint: Dyspnea on exertion, peripheral edema  HPI: Jack Chavez is a 64 y.o. male with a past medical history of hypertension, morbid obesity presenting with complaints of dyspnea on exertion and peripheral edema.  Patient reports 10-day history of dyspnea on exertion and worsening peripheral edema.  States his abdomen is distended and both of his legs are swollen.  Denies history of CHF.  He has not been able to take his home hydrochlorothiazide for the past 1 month because he ran out of refills.  He is from [place] and currently visiting this area.  Denies chest pain, fevers, or cough.  Also reports 3-week history of intermittent episodes of abdominal cramping and diarrhea.  No vomiting.  No recent antibiotic use or travel.  States he was seen at urgent care and told he needs to come into the ED because his kidney function was abnormal.  Denies any prior history of kidney problems.  ED Course: Afebrile.  Blood pressure elevated.  Slightly tachypneic but not hypoxic.  Labs showing no leukocytosis.  Potassium mildly elevated at 5.4.  BUN 26, creatinine 2.0.  No prior labs for comparison.  T bili 2.1 and no significant elevation of remainder of LFTs.  BNP significantly elevated at 1446.  EKG with Q waves in anterior leads, no acute ischemic changes. Chest x-ray showing layering right pleural effusion with right lower lobe airspace opacity which could reflect atelectasis or pneumonia. Patient received IV Lasix 40 mg and Lokelma.  Review of Systems:  All systems reviewed and apart from history of presenting illness, are negative.  Past Medical History:  Diagnosis Date  . HTN (hypertension)     Past Surgical History:  Procedure Laterality Date  . KNEE SURGERY    . SHOULDER SURGERY       reports that he has quit smoking. His smoking use included cigars. He has never used  smokeless tobacco. He reports current alcohol use. He reports that he does not use drugs.  Allergies  Allergen Reactions  . Morphine And Related     Patient reports "stomach tightness" in the past related to morphine use and does not remember what other reactions it caused.    Family History  Problem Relation Age of Onset  . Diabetes Father   . Diabetes Paternal Grandmother     Prior to Admission medications   Medication Sig Start Date End Date Taking? Authorizing Provider  amLODipine (NORVASC) 10 MG tablet Take 10 mg by mouth daily.   Yes [provider]  co-enzyme Q-10 30 MG capsule Take 30 mg by mouth daily.   Yes [provider]  Cyanocobalamin (VITAMIN B12) 3000 MCG/ML LIQD Place 1 mL under the tongue daily.   Yes [provider]  Garlic 10 MG CAPS Take 2 capsules by mouth in the morning and at bedtime.   Yes [provider]  hydrochlorothiazide (HYDRODIURIL) 12.5 MG tablet Take 12.5 mg by mouth daily.   Yes [provider]  Magnesium 500 MG TABS Take 3 tablets by mouth daily.   Yes [provider]  Probiotic Product (PROBIOTIC PO) Take 1 capsule by mouth daily.   Yes [provider]  TURMERIC PO Take 1 capsule by mouth every other day.   Yes [provider]  VITAMIN A PO Take 1 tablet by mouth every other day.   Yes [provider]  Physical Exam: Vitals:   09/20/19 0100 09/20/19 0230 09/20/19 0300 09/20/19 0330  BP: (!) 166/107 (!) 120/54 137/88 139/73  Pulse: 95 96 97 96  Resp: (!) 22 20 (!) 22 19  Temp:      TempSrc:      SpO2: 92% 92% 91% 92%    Physical Exam  Constitutional: He is oriented to person, place, and time. He appears well-developed and well-nourished. No distress.  HENT:  Head: Normocephalic.  Eyes: Right eye exhibits no discharge. Left eye exhibits no discharge.  Cardiovascular: Normal rate, regular rhythm and intact distal pulses.  Pulmonary/Chest: Effort normal. No  respiratory distress. He has no wheezes.  Examination limited secondary to patient's large body habitus  Abdominal: Soft. Bowel sounds are normal. He exhibits distension. There is no abdominal tenderness. There is no guarding.  Abdomen appears significantly distended  Musculoskeletal:        General: Edema present.     Cervical back: Neck supple.     Comments: Significant bilateral lower extremity pitting edema extending up to the abdomen  Neurological: He is alert and oriented to person, place, and time.  Skin: Skin is warm and dry. He is not diaphoretic.     Labs on Admission: I have personally reviewed following labs and imaging studies  CBC: Recent Labs  Lab 09/19/19 2100  WBC 5.1  HGB 16.0  HCT 51.2  MCV 91.8  PLT 191   Basic Metabolic Panel: Recent Labs  Lab 09/19/19 2100  NA 139  K 5.4*  CL 102  CO2 27  GLUCOSE 79  BUN 26*  CREATININE 2.07*  CALCIUM 8.7*   GFR: CrCl cannot be calculated (Unknown ideal weight.). Liver Function Tests: Recent Labs  Lab 09/19/19 2100  AST 44*  ALT 21  ALKPHOS 66  BILITOT 2.1*  PROT 7.3  ALBUMIN 3.7   Recent Labs  Lab 09/19/19 2100  LIPASE 36   No results for input(s): AMMONIA in the last 168 hours. Coagulation Profile: No results for input(s): INR, PROTIME in the last 168 hours. Cardiac Enzymes: No results for input(s): CKTOTAL, CKMB, CKMBINDEX, TROPONINI in the last 168 hours. BNP (last 3 results) No results for input(s): PROBNP in the last 8760 hours. HbA1C: No results for input(s): HGBA1C in the last 72 hours. CBG: No results for input(s): GLUCAP in the last 168 hours. Lipid Profile: No results for input(s): CHOL, HDL, LDLCALC, TRIG, CHOLHDL, LDLDIRECT in the last 72 hours. Thyroid Function Tests: No results for input(s): TSH, T4TOTAL, FREET4, T3FREE, THYROIDAB in the last 72 hours. Anemia Panel: No results for input(s): VITAMINB12, FOLATE, FERRITIN, TIBC, IRON, RETICCTPCT in the last 72 hours. Urine  analysis: No results found for: COLORURINE, APPEARANCEUR, LABSPEC, [place], GLUCOSEU, HGBUR, BILIRUBINUR, KETONESUR, PROTEINUR, UROBILINOGEN, NITRITE, LEUKOCYTESUR  Radiological Exams on Admission: DG Chest Portable 1 View  Result Date: 09/19/2019 CLINICAL DATA:  Fluid retention, edema EXAM: PORTABLE CHEST 1 VIEW COMPARISON:  None FINDINGS: Cardiomegaly. Right lower lobe airspace opacity with probable layering effusion. No confluent opacity on the left. No acute bony abnormality. IMPRESSION: Layering right pleural effusion with right lower lobe airspace opacity which could reflect atelectasis or pneumonia. Cardiomegaly. Electronically Signed   By: Rolm Baptise M.D.   On: 09/19/2019 21:33    EKG: Independently reviewed.  Sinus rhythm, Q waves in anterior leads.  No prior tracing for comparison.  Assessment/Plan Principal Problem:   Acute exacerbation of CHF (congestive heart failure) (HCC) Active Problems:   Renal insufficiency   Hypokalemia  Serum total bilirubin elevated   Hypertension   New onset CHF: Presenting with peripheral edema.  Not hypoxic.  BNP significantly elevated at 1446.  Chest x-ray showing layering right pleural effusion with right lower lobe airspace opacity.  Pneumonia less likely given no fever or leukocytosis.  Plan is to continue IV Lasix 40 mg twice daily, monitor intake and output, daily weights, and low-sodium diet with fluid restriction.  Order echocardiogram.  Check procalcitonin level.  Renal insufficiency: Creatinine 2.0, no prior labs for comparison. ?Cardiorenal due to decompensated heart failure.  Continue to monitor renal function with IV diuresis.  Diarrhea: No fever or leukocytosis.  Not complaining of any abdominal pain at present.  Denies recent antibiotic use.  Order GI pathogen panel.  Mild hypokalemia: Received Lokelma in the ED.  EKG without acute changes.  Repeat BMP.  Elevated T bili: Order right upper quadrant ultrasound  Hypertension:  Currently normotensive.  Lasix as above and continue home amlodipine.  Hold home hydrochlorothiazide.  DVT prophylaxis: Subcutaneous heparin Code Status: Full code Family Communication: No family available at this time. Disposition Plan: Needs IV Lasix for volume overload.  Will likely need to stay in the hospital for greater than 2 midnights. Consults called: None Admission status: It is my clinical opinion that admission to INPATIENT is reasonable and necessary because of the expectation that this patient will require hospital care that crosses at least 2 midnights to treat this condition based on the medical complexity of the problems presented.  Given the aforementioned information, the predictability of an adverse outcome is felt to be significant.  The medical decision making on this patient was of high complexity and the patient is at high risk for clinical deterioration, therefore this is a level 3 visit.  John Giovanni MD [facility]  If 7PM-7AM, please contact night-coverage [email or web address]  09/20/2019, 3:55 AM

## 2019-09-20 NOTE — ED Notes (Signed)
NT assisted pt to hospital bed, provided clean gown, assisted with peri hygiene. Pt one person standby assistance.

## 2019-09-20 NOTE — ED Notes (Signed)
Pt provided breakfast tray.

## 2019-09-20 NOTE — ED Notes (Signed)
IV team at bedside 

## 2019-09-21 ENCOUNTER — Inpatient Hospital Stay (HOSPITAL_COMMUNITY): Payer: BLUE CROSS/BLUE SHIELD

## 2019-09-21 ENCOUNTER — Encounter (HOSPITAL_COMMUNITY): Payer: Self-pay | Admitting: Internal Medicine

## 2019-09-21 DIAGNOSIS — I5021 Acute systolic (congestive) heart failure: Secondary | ICD-10-CM

## 2019-09-21 DIAGNOSIS — I272 Pulmonary hypertension, unspecified: Secondary | ICD-10-CM

## 2019-09-21 LAB — RENAL FUNCTION PANEL
Albumin: 3.4 g/dL — ABNORMAL LOW (ref 3.5–5.0)
Anion gap: 9 (ref 5–15)
BUN: 27 mg/dL — ABNORMAL HIGH (ref 8–23)
CO2: 30 mmol/L (ref 22–32)
Calcium: 8.6 mg/dL — ABNORMAL LOW (ref 8.9–10.3)
Chloride: 103 mmol/L (ref 98–111)
Creatinine, Ser: 2.1 mg/dL — ABNORMAL HIGH (ref 0.61–1.24)
GFR calc Af Amer: 38 mL/min — ABNORMAL LOW (ref >60)
GFR calc non Af Amer: 33 mL/min — ABNORMAL LOW (ref >60)
Glucose, Bld: 99 mg/dL (ref 70–99)
Phosphorus: 5.2 mg/dL — ABNORMAL HIGH (ref 2.5–4.6)
Potassium: 4.7 mmol/L (ref 3.5–5.1)
Sodium: 142 mmol/L (ref 135–145)

## 2019-09-21 LAB — TSH: TSH: 0.71 u[IU]/mL (ref 0.350–4.500)

## 2019-09-21 MED ORDER — CARVEDILOL 3.125 MG PO TABS
3.1250 mg | ORAL_TABLET | Freq: Two times a day (BID) | ORAL | Status: DC
  Administered 2019-09-21 – 2019-09-24 (×7): 3.125 mg via ORAL
  Filled 2019-09-21 (×7): qty 1

## 2019-09-21 MED ORDER — FUROSEMIDE 10 MG/ML IJ SOLN
40.0000 mg | Freq: Three times a day (TID) | INTRAMUSCULAR | Status: DC
  Administered 2019-09-21 – 2019-09-24 (×9): 40 mg via INTRAVENOUS
  Filled 2019-09-21 (×9): qty 4

## 2019-09-21 MED ORDER — LIVING BETTER WITH HEART FAILURE BOOK: Freq: Once | Status: AC

## 2019-09-21 NOTE — Progress Notes (Signed)
PROGRESS NOTE    Mardell Cragg  DUK:[id number] DOB: 09/03/1955 DOA: 09/19/2019 PCP: Patient, No Pcp Per   Brief Narrative: 64 year old with past medical history significant for hypertension morbid obesity who presents complaining of worsening shortness of breath on exertion and peripheral edema.  He reports 10 days history of shortness of breath on exertion.  He also reports abdominal distention.  He ran of his hydrochlorothiazide for a month.  He reports diarrhea.  He went to an urgent care center and was advised to come to the ED due to abnormal kidney function.   Assessment & Plan:   Principal Problem:   Acute exacerbation of CHF (congestive heart failure) (HCC) Active Problems:   Renal insufficiency   Hypokalemia   Serum total bilirubin elevated   Hypertension   Acute systolic CHF (congestive heart failure), NYHA class 4 (HCC)  1-Acute systolic and diastolic heart failure exacerbation Continue with IV Lasix 40 mg IV TID. Echocardiogram showed ejection fraction 20 to 25% diastolic dysfunction, generalized hypokinesis.  Cardiology consulted. Patient will required right and left [place] cath.  Daily weight.   2-CKD stage 3, pre nephrology , less likely AKI Continue with lasix. Increase to 40 mg TID Nephrology consulted.   3-Acute hypoxic respiratory failure, requiring oxygen supplementation.  Oxygen decreased to 70.  In setting heart failure.  Continue with IV lasix   Diarrhea; report 3 episode watery stool daily.  GI pathogen ordered.  No further diarrhea in the hospital  Mild elevation Bili;  [place]; Very nodular hepatic contour suggesting cirrhosis. Moderate ascites. Ascites; might need paracentesis.   HTN; Continue with lasix.   Hyperkalemia; received lokelma   Estimated body mass index is 49.56 kg/m as calculated from the following:   Height as of this encounter: 6\' 1"  (1.854 m).   Weight as of this encounter: 170.4 kg.   DVT prophylaxis:  Heparin  Code Status:  full code Family Communication: care discussed with patient.  Disposition Plan:  Patient is from: home  Anticipated d/c date: in 2--3 days  Barriers to d/c or necessity for inpatient status: remain in the hospital for management of acute Systolic HF, requiring IV lasix.   Consultants:   Cardiology  Nephrology   Procedures:   ECHO Systolic and diastolic HF, Ef   Antimicrobials:    Subjective: He report SOB, mild improvement of LE edema and abdominal distension.    Objective: Vitals:   09/20/19 2126 09/21/19 0441 09/21/19 1248 09/21/19 1257  BP: (!) 143/89 (!) 163/98  127/82  Pulse: 98 95  (!) 105  Resp: 16 14  18   Temp: 98.4 F (36.9 C) 98.9 F (37.2 C)  97.7 F (36.5 C)  TempSrc: Oral   Oral  SpO2: 90% 99%  99%  Weight:   (!) 170.4 kg   Height:   6\' 1"  (1.854 m)     Intake/Output Summary (Last 24 hours) at 09/21/2019 1518 Last data filed at 09/21/2019 1246 Gross per 24 hour  Intake 240 ml  Output 1400 ml  Net -1160 ml   Filed Weights   09/21/19 1248  Weight: (!) 170.4 kg    Examination:  General exam: NAD Respiratory system: Crackles bilaterally  Cardiovascular system: S 1, S 2 RRR Gastrointestinal system: BS present, distended, umbilical hernia.  Central nervous system: Alert, non focal.  Extremities: plus 2 edema    Data Reviewed: I have personally reviewed following labs and imaging studies  CBC: Recent Labs  Lab 09/19/19 2100  WBC 5.1  HGB  16.0  HCT 51.2  MCV 91.8  PLT 288   Basic Metabolic Panel: Recent Labs  Lab 09/19/19 2100 09/20/19 0317 09/20/19 1453 09/21/19 0529  NA 139 140 142 142  K 5.4* 5.2* 4.5 4.7  CL 102 99 102 103  CO2 27 28 32 30  GLUCOSE 79 72 101* 99  BUN 26* 27* 28* 27*  CREATININE 2.07* 2.01* 1.95* 2.10*  CALCIUM 8.7* 8.5* 8.5* 8.6*  PHOS  --   --  4.5 5.2*   GFR: Estimated Creatinine Clearance: 60.7 mL/min (A) (by C-G formula based on SCr of 2.1 mg/dL (H)). Liver Function Tests: Recent Labs  Lab  09/19/19 2100 09/20/19 1453 09/21/19 0529  AST 44*  --   --   ALT 21  --   --   ALKPHOS 66  --   --   BILITOT 2.1*  --   --   PROT 7.3  --   --   ALBUMIN 3.7 3.2* 3.4*   Recent Labs  Lab 09/19/19 2100  LIPASE 36   No results for input(s): AMMONIA in the last 168 hours. Coagulation Profile: No results for input(s): INR, PROTIME in the last 168 hours. Cardiac Enzymes: No results for input(s): CKTOTAL, CKMB, CKMBINDEX, TROPONINI in the last 168 hours. BNP (last 3 results) No results for input(s): PROBNP in the last 8760 hours. HbA1C: No results for input(s): HGBA1C in the last 72 hours. CBG: No results for input(s): GLUCAP in the last 168 hours. Lipid Profile: No results for input(s): CHOL, HDL, LDLCALC, TRIG, CHOLHDL, LDLDIRECT in the last 72 hours. Thyroid Function Tests: Recent Labs    09/21/19 0857  TSH 0.710   Anemia Panel: No results for input(s): VITAMINB12, FOLATE, FERRITIN, TIBC, IRON, RETICCTPCT in the last 72 hours. Sepsis Labs: Recent Labs  Lab 09/20/19 0317  PROCALCITON <0.10    Recent Results (from the past 240 hour(s))  SARS CORONAVIRUS 2 (TAT 6-24 HRS) Nasopharyngeal Nasopharyngeal Swab     Status: None   Collection Time: 09/19/19 11:38 PM   Specimen: Nasopharyngeal Swab  Result Value Ref Range Status   SARS Coronavirus 2 NEGATIVE NEGATIVE Final    Comment: (NOTE) SARS-CoV-2 target nucleic acids are NOT DETECTED. The SARS-CoV-2 RNA is generally detectable in upper and lower respiratory specimens during the acute phase of infection. Negative results do not preclude SARS-CoV-2 infection, do not rule out co-infections with other pathogens, and should not be used as the sole basis for treatment or other patient management decisions. Negative results must be combined with clinical observations, patient history, and epidemiological information. The expected result is Negative. Fact Sheet for Patients: HairSlick.no Fact  Sheet for Healthcare Providers: quierodirigir.com This test is not yet approved or cleared by the [place] FDA and  has been authorized for detection and/or diagnosis of SARS-CoV-2 by FDA under an Emergency Use Authorization (EUA). This EUA will remain  in effect (meaning this test can be used) for the duration of the COVID-19 declaration under Section 56 4(b)(1) of the Act, 21 U.S.C. section 360bbb-3(b)(1), unless the authorization is terminated or revoked sooner. Performed at [facility], [address], [place], [place] [address]          Radiology Studies: US RENAL  Result Date: 09/21/2019 CLINICAL DATA:  Acute renal failure EXAM: RENAL / URINARY TRACT ULTRASOUND COMPLETE COMPARISON:  None. FINDINGS: Right Kidney: Renal measurements: 10.5 x 5.2 x 5.9 cm = volume: 167 mL . Echogenicity within normal limits. No mass or hydronephrosis visualized.  Left Kidney: Renal measurements: 11.7 x 4.8 x 5.5 cm = volume: 164 mL. Echogenicity within normal limits. No mass or hydronephrosis visualized. Bladder: Appears normal for degree of bladder distention. Other: Ascites seen in the abdomen and pelvis. IMPRESSION: No acute findings.  No hydronephrosis. Ascites. Electronically Signed   By: Rolm Baptise M.D.   On: 09/21/2019 02:35   DG Chest Portable 1 View  Result Date: 09/19/2019 CLINICAL DATA:  Fluid retention, edema EXAM: PORTABLE CHEST 1 VIEW COMPARISON:  None FINDINGS: Cardiomegaly. Right lower lobe airspace opacity with probable layering effusion. No confluent opacity on the left. No acute bony abnormality. IMPRESSION: Layering right pleural effusion with right lower lobe airspace opacity which could reflect atelectasis or pneumonia. Cardiomegaly. Electronically Signed   By: Rolm Baptise M.D.   On: 09/19/2019 21:33   ECHOCARDIOGRAM COMPLETE  Result Date: 09/20/2019    ECHOCARDIOGRAM REPORT   Patient Name:   ROLDAN LAFOREST Date of Exam: 09/20/2019 Medical Rec #:   [id number]   Height: Accession #:    [id number]  Weight: Date of Birth:  [birth year]   BSA: Patient Age:    46 years    BP:           142/81 mmHg Patient Gender: M           HR:           94 bpm. Exam Location:  Inpatient Procedure: 2D Echo, Cardiac Doppler and Color Doppler Indications:    CHF-Acute Systolic 564.33  History:        Patient has no prior history of Echocardiogram examinations.                 CHF; Risk Factors:Hypertension and Former Smoker.  Sonographer:    Vickie Epley RDCS Referring Phys: [id number] [place]  1. Severe global reduction in LV systolic function; moderate LVE; grade 2 diastolic dysfunction; biatrial enlargement; severe RV dysfunction; mild TR with severe pulmonary hypertension.  2. Left ventricular ejection fraction, by estimation, is 20 to 25%. The left ventricle has severely decreased function. The left ventricle demonstrates global hypokinesis. The left ventricular internal cavity size was moderately dilated. Left ventricular diastolic parameters are consistent with Grade II diastolic dysfunction (pseudonormalization). Elevated left atrial pressure.  3. Right ventricular systolic function is severely reduced. The right ventricular size is normal. There is severely elevated pulmonary artery systolic pressure.  4. Left atrial size was mildly dilated.  5. Right atrial size was moderately dilated.  6. The mitral valve is normal in structure. Trivial mitral valve regurgitation. No evidence of mitral stenosis.  7. The aortic valve is tricuspid. Aortic valve regurgitation is not visualized. No aortic stenosis is present.  8. The inferior vena cava is dilated in size with <50% respiratory variability, suggesting right atrial pressure of 15 mmHg. FINDINGS  Left Ventricle: Left ventricular ejection fraction, by estimation, is 20 to 25%. The left ventricle has severely decreased function. The left ventricle demonstrates global hypokinesis. The left ventricular internal cavity  size was moderately dilated. There is no left ventricular hypertrophy. Left ventricular diastolic parameters are consistent with Grade II diastolic dysfunction (pseudonormalization). Elevated left atrial pressure. Right Ventricle: The right ventricular size is normal.Right ventricular systolic function is severely reduced. There is severely elevated pulmonary artery systolic pressure. The tricuspid regurgitant velocity is 3.71 m/s, and with an assumed right atrial  pressure of 15 mmHg, the estimated right ventricular systolic pressure is 16.6 mmHg. Left Atrium: Left atrial size was mildly dilated. Right Atrium:  Right atrial size was moderately dilated. Pericardium: There is no evidence of pericardial effusion. Mitral Valve: The mitral valve is normal in structure. Normal mobility of the mitral valve leaflets. Trivial mitral valve regurgitation. No evidence of mitral valve stenosis. Tricuspid Valve: The tricuspid valve is normal in structure. Tricuspid valve regurgitation is mild . No evidence of tricuspid stenosis. Aortic Valve: The aortic valve is tricuspid. Aortic valve regurgitation is not visualized. No aortic stenosis is present. Pulmonic Valve: The pulmonic valve was normal in structure. Pulmonic valve regurgitation is mild. No evidence of pulmonic stenosis. Aorta: The aortic root is normal in size and structure. Venous: The inferior vena cava is dilated in size with less than 50% respiratory variability, suggesting right atrial pressure of 15 mmHg. IAS/Shunts: There is left bowing of the interatrial septum, suggestive of elevated right atrial pressure. No atrial level shunt detected by color flow Doppler. Additional Comments: Severe global reduction in LV systolic function; moderate LVE; grade 2 diastolic dysfunction; biatrial enlargement; severe RV dysfunction; mild TR with severe pulmonary hypertension. There is pleural effusion in the left lateral region.  LEFT VENTRICLE PLAX 2D LVIDd:         6.00 cm       Diastology LVIDs:         5.50 cm      LV e' lateral:   4.68 cm/s LV PW:         0.90 cm      LV E/e' lateral: 17.4 LV IVS:        0.90 cm      LV e' medial:    4.19 cm/s LVOT diam:     2.10 cm      LV E/e' medial:  19.4 LV SV:         43 LVOT Area:     3.46 cm  LV Volumes (MOD) LV vol d, MOD A2C: 175.0 ml LV vol d, MOD A4C: 198.0 ml LV vol s, MOD A2C: 150.0 ml LV vol s, MOD A4C: 148.0 ml LV SV MOD A2C:     25.0 ml LV SV MOD A4C:     198.0 ml LV SV MOD BP:      44.3 ml RIGHT VENTRICLE RV S prime:     9.65 cm/s TAPSE (M-mode): 1.3 cm LEFT ATRIUM             RIGHT ATRIUM LA diam:        5.30 cm RA Area:     30.10 cm LA Vol (A2C):   62.5 ml RA Volume:   106.00 ml LA Vol (A4C):   30.2 ml LA Biplane Vol: 46.3 ml  AORTIC VALVE LVOT Vmax:   80.90 cm/s LVOT Vmean:  50.300 cm/s LVOT VTI:    0.123 m  AORTA Ao Root diam: 3.10 cm MITRAL VALVE               TRICUSPID VALVE MV Area (PHT): 7.16 cm    TR Peak grad:   55.1 mmHg MV Decel Time: 106 msec    TR Vmax:        371.00 cm/s MV E velocity: 81.40 cm/s MV A velocity: 36.80 cm/s  SHUNTS MV E/A ratio:  2.21        Systemic VTI:  0.12 m                            Systemic Diam: 2.10 cm Olga Millers MD Electronically signed by  Olga Millers MD Signature Date/Time: 09/20/2019/1:00:43 PM    Final    US Abdomen Limited RUQ  Result Date: 09/20/2019 CLINICAL DATA:  Elevated LFTs. EXAM: ULTRASOUND ABDOMEN LIMITED RIGHT UPPER QUADRANT COMPARISON:  No prior. FINDINGS: Gallbladder: Not visualized Common bile duct: Diameter: Not visualized Liver: Nodular hepatic contour suggesting the possibility of cirrhosis. No definite mass noted. Portal vein not well visualized. Other: Moderate ascites. This exam was extremely limited due to patient's body habitus and overlying bowel gas. IMPRESSION: 1. Very limited exam due to patient's body habitus and overlying bowel gas. Gallbladder and common bile duct not visualized. 2. Very nodular hepatic contour suggesting cirrhosis. Moderate ascites.  Electronically Signed   By: Maisie Fus  Register   On: 09/20/2019 07:35        Scheduled Meds: . carvedilol  3.125 mg Oral BID WC  . furosemide  40 mg Intravenous TID  . heparin  5,000 Units Subcutaneous Q8H  . sodium chloride flush  3 mL Intravenous Q12H   Continuous Infusions: . sodium chloride       LOS: 1 day    Time spent: 35 minutes    Breckin Savannah A Nkenge Sonntag, MD [facility]   If 7PM-7AM, please contact night-coverage [email or web address]  09/21/2019, 3:18 PM

## 2019-09-21 NOTE — TOC Initial Note (Signed)
Transition of Care [facility]) - Initial/Assessment Note    Patient Details  Name: Firmin Belisle MRN: [id number] Date of Birth: Jul 02, 1955  [facility]) CM/SW Contact:    Lanier Clam, RN Phone Number: 09/21/2019, 12:44 PM  Clinical Narrative: CM referral for CHF screen-not appropriate-only 1 adm/36months.no ED visits/71months. Has a pcp-he will make own appt,has pharmancy,& own transport home. No further CM needs.                  Expected Discharge Plan: Home/Self Care Barriers to Discharge: Continued Medical Work up   Patient Goals and CMS Choice Patient states their goals for this hospitalization and ongoing recovery are:: go home      Expected Discharge Plan and Services Expected Discharge Plan: Home/Self Care   Discharge Planning Services: CM Consult   Living arrangements for the past 2 months: Single Family Home                                      Prior Living Arrangements/Services Living arrangements for the past 2 months: Single Family Home Lives with:: Self Patient language and need for interpreter reviewed:: Yes Do you feel safe going back to the place where you live?: Yes      Need for Family Participation in Patient Care: No (Comment) Care giver support system in place?: Yes (comment)   Criminal Activity/Legal Involvement Pertinent to Current Situation/Hospitalization: No - Comment as needed  Activities of Daily Living Home Assistive Devices/Equipment: Cane (specify quad or straight) ADL Screening (condition at time of admission) Patient's cognitive ability adequate to safely complete daily activities?: Yes Is the patient deaf or have difficulty hearing?: No Does the patient have difficulty seeing, even when wearing glasses/contacts?: No Does the patient have difficulty concentrating, remembering, or making decisions?: No Patient able to express need for assistance with ADLs?: Yes Does the patient have difficulty dressing or bathing?:  No Independently performs ADLs?: Yes (appropriate for developmental age) Does the patient have difficulty walking or climbing stairs?: Yes Weakness of Legs: None Weakness of Arms/Hands: None  Permission Sought/Granted Permission sought to share information with : Case Manager Permission granted to share information with : Yes, Verbal Permission Granted              Emotional Assessment Appearance:: Appears stated age Attitude/Demeanor/Rapport: Gracious Affect (typically observed): Accepting Orientation: : Oriented to Self, Oriented to Place, Oriented to  Time Alcohol / Substance Use: Not Applicable Psych Involvement: No (comment)  Admission diagnosis:  Elevated LFTs [R79.89] Acute exacerbation of CHF (congestive heart failure) (HCC) [I50.9] Hypertension, unspecified type [I10] Acute on chronic congestive heart failure, unspecified heart failure type [facility]) [I50.9] Patient Active Problem List   Diagnosis Date Noted  . Acute exacerbation of CHF (congestive heart failure) (HCC) 09/20/2019  . Renal insufficiency 09/20/2019  . Hypokalemia 09/20/2019  . Serum total bilirubin elevated 09/20/2019  . Hypertension 09/20/2019   PCP:  Patient, No Pcp Per Pharmacy:   [facility] [address] [place] - [address] [place] [address] Phone: [phone] Fax: [phone]     Social Determinants of Health (SDOH) Interventions    Readmission Risk Interventions No flowsheet data found.

## 2019-09-21 NOTE — Consult Note (Addendum)
Cardiology Consultation:   Patient ID: Jack Chavez MRN: [id number]; DOB: [birth year]  Admit date: 09/19/2019 Date of Consult: 09/21/2019  Primary Care Provider: Patient, No Pcp Per Primary Cardiologist: new to Verne Carrow, MD  Primary Electrophysiologist:  None    Patient Profile:   Jack Chavez is a 64 y.o. male with a hx of HTN and arthritis who is being seen today for the evaluation of new onset congestive heart failure at the request of Dr. Sunnie Nielsen.  History of Present Illness:   Jack Chavez has no prior cardiac history, but does have a history of HTN. 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 He gets drug tested and Covid tested regularly with no history of positivity. He ran out of refills of his HCTZ and amlodipine earlier this year. In early February he began to notice epigastric indigestion and diarrhea followed by progressive lower extremity edema, ascites and dyspnea. He's had chronic orthopnea ever since having back issues. He says he delayed seeking care because he was fearful of contracting Covid in the hospital. He also had been working to transfer from [place] to here locally where he resides. 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 He's also been observed to become hypoxic while sleeping, highly suspicious for OSA. Nephrology is following. 2D echo yesterday showed EF 20-25%, global HK, moderately dilated LV, grade 2 DD, severely reduced RV function with severe pulmonary THN, mild LAE, moderate RAE, dilated IVC. He received Lokelma for his K of 5.4. BNP 1446, TSH wnl, Covid and HIV negative.  He is being treated with Lasix  IV BID. He has not had height, weight or strict I/O's on file. He believes he's begun putting out urine with slow improvement but appears grossly volume  overloaded. Creatinine has remained fairly stable since admission, 2.07-2.01-1.95-2.10. No family hx of CAD, only DM.   Past Medical History:  Diagnosis Date  . HTN (hypertension)   . Morbid obesity (HCC)     Past Surgical History:  Procedure Laterality Date  . KNEE SURGERY    . SHOULDER SURGERY       Home Medications:  Prior to Admission medications   Medication Sig Start Date End Date Taking? Authorizing Provider  amLODipine (NORVASC) 10 MG tablet Take 10 mg by mouth daily.   Yes [provider]  co-enzyme Q-10 30 MG capsule Take 30 mg by mouth daily.   Yes [provider]  Cyanocobalamin (VITAMIN B12) 3000 MCG/ML LIQD Place 1 mL under the tongue daily.   Yes [provider]  Garlic 10 MG CAPS Take 2 capsules by mouth in the morning and at bedtime.   Yes [provider]  hydrochlorothiazide (HYDRODIURIL) 12.5 MG tablet Take 12.5 mg by mouth daily.   Yes [provider]  Magnesium 500 MG TABS Take 3 tablets by mouth daily.   Yes [provider]  Probiotic Product (PROBIOTIC PO) Take 1 capsule by mouth daily.   Yes [provider]  TURMERIC PO Take 1 capsule by mouth every other day.   Yes [provider]  VITAMIN A PO Take 1 tablet by mouth every other day.   Yes [provider]    Inpatient Medications: Scheduled Meds: . carvedilol  3.125 mg Oral BID WC  . furosemide  40 mg Intravenous BID  . heparin  5,000 Units Subcutaneous Q8H  .  Living Better with Heart Failure Book   Does not apply Once  . sodium chloride flush  3 mL Intravenous Q12H  . sodium zirconium cyclosilicate  10 g Oral Daily   Continuous Infusions: . sodium chloride     PRN Meds: sodium chloride, acetaminophen, sodium chloride flush  Allergies:    Allergies  Allergen Reactions  . Morphine And Related     Patient reports "stomach tightness" in the past related to morphine use and does not remember what other reactions it  caused.    Social History:   Social History   Socioeconomic History  . Marital status: Single    Spouse name: Not on file  . Number of children: Not on file  . Years of education: Not on file  . Highest education level: Not on file  Occupational History  . Not on file  Tobacco Use  . Smoking status: Former Smoker    Types: Cigars  . Smokeless tobacco: Never Used  . Tobacco comment: Occasional cigars in the 1980s  Substance and Sexual Activity  . Alcohol use: Yes    Comment: occasional - just few drinks a month  . Drug use: Never  . Sexual activity: Not on file  Other Topics Concern  . Not on file  Social History Narrative  . Not on file   Social Determinants of Health   Financial Resource Strain:   . Difficulty of Paying Living Expenses:   Food Insecurity:   . Worried About Charity fundraiser in the Last Year:   . Arboriculturist in the Last Year:   Transportation Needs:   . Film/video editor (Medical):   Marland Kitchen Lack of Transportation (Non-Medical):   Physical Activity:   . Days of Exercise per Week:   . Minutes of Exercise per Session:   Stress:   . Feeling of Stress :   Social Connections:   . Frequency of Communication with Friends and Family:   . Frequency of Social Gatherings with Friends and Family:   . Attends Religious Services:   . Active Member of Clubs or Organizations:   . Attends Archivist Meetings:   Marland Kitchen Marital Status:   Intimate Partner Violence:   . Fear of Current or Ex-Partner:   . Emotionally Abused:   Marland Kitchen Physically Abused:   . Sexually Abused:     Family History:     Family History  Problem Relation Age of Onset  . Diabetes Father   . Diabetes Paternal Grandmother      ROS:  Please see the history of present illness.  + diarrhea, nonbloody. All other ROS reviewed and negative.     Physical Exam/Data:   Vitals:   09/20/19 1538 09/20/19 1840 09/20/19 2126 09/21/19 0441  BP: 138/75 (!) 153/91 (!) 143/89 (!) 163/98    Pulse: 96 98 98 95  Resp: 18 18 16 14   Temp: (!) 97.5 F (36.4 C) 98.4 F (36.9 C) 98.4 F (36.9 C) 98.9 F (37.2 C)  TempSrc: Oral Oral Oral   SpO2: 91% 91% 90% 99%    Intake/Output Summary (Last 24 hours) at 09/21/2019 1155 Last data filed at 09/21/2019 1058 Gross per 24 hour  Intake 240 ml  Output 550 ml  Net -310 ml   No flowsheet data found.   There is no height or weight on file to calculate BMI.  Vital Signs. BP (!) 163/98 (BP Location: Right Arm)   Pulse 95   Temp 98.9 F (  37.2 C)   Resp 14   SpO2 99%  General: Morbidly obese AAM, NAD Head: Normocephalic, atraumatic, sclera non-icteric, no xanthomas, nares are without discharge. Neck: Negative for carotid bruits. JVP elevated Lungs: Diffusely diminished throughout without wheezes, rales, or rhonchi. Breathing is unlabored. Heart: RRR, distant heart sounds with body habitus, S1 S2 without murmurs, rubs, or gallops.  Abdomen: Diffusely distended/taut, nontender, umbilical hernia noted, normoactive bowel sounds. No rebound/guarding. Extremities: No clubbing or cyanosis. 2-3+ diffuse edema up to thighs. Distal pedal pulses are difficult to palpate with pedal edema but legs are warm. Neuro: Alert and oriented X 3. Moves all extremities spontaneously. Psych:  Responds to questions appropriately with a normal affect.  EKG:  The EKG was personally reviewed and demonstrates: NSR 95bpm, possible prior old anterior infarct, nonspecific STT changes, QTc , QRS  Telemetry:  Telemetry was personally reviewed and demonstrates:  NSR/sinus tach  Relevant CV Studies: 2D echo 09/20/19 1. Severe global reduction in LV systolic function; moderate LVE; grade 2  diastolic dysfunction; biatrial enlargement; severe RV dysfunction; mild  TR with severe pulmonary hypertension.  2. Left ventricular ejection fraction, by estimation, is 20 to 25%. The  left ventricle has severely decreased function. The left ventricle  demonstrates  global hypokinesis. The left ventricular internal cavity size  was moderately dilated. Left  ventricular diastolic parameters are consistent with Grade II diastolic  dysfunction (pseudonormalization). Elevated left atrial pressure.  3. Right ventricular systolic function is severely reduced. The right  ventricular size is normal. There is severely elevated pulmonary artery  systolic pressure.  4. Left atrial size was mildly dilated.  5. Right atrial size was moderately dilated.  6. The mitral valve is normal in structure. Trivial mitral valve  regurgitation. No evidence of mitral stenosis.  7. The aortic valve is tricuspid. Aortic valve regurgitation is not  visualized. No aortic stenosis is present.  8. The inferior vena cava is dilated in size with <50% respiratory  variability, suggesting right atrial pressure of 15 mmHg.   Laboratory Data:  High Sensitivity Troponin:  No results for input(s): TROPONINIHS in the last 720 hours.   Chemistry Recent Labs  Lab 09/20/19 0317 09/20/19 1453 10-19-19 0529  NA 140 142 142  K 5.2* 4.5 4.7  CL 99 102 103  CO2 28 32 30  GLUCOSE 72 101* 99  BUN 27* 28* 27*  CREATININE 2.01* 1.95* 2.10*  CALCIUM 8.5* 8.5* 8.6*  GFRNONAA 35* 36* 33*  GFRAA 40* 42* 38*  ANIONGAP 13 8 9     Recent Labs  Lab 09/19/19 2100 09/20/19 1453 2019/10/19 0529  PROT 7.3  --   --   ALBUMIN 3.7 3.2* 3.4*  AST 44*  --   --   ALT 21  --   --   ALKPHOS 66  --   --   BILITOT 2.1*  --   --    Hematology Recent Labs  Lab 09/19/19 2100  WBC 5.1  RBC 5.58  HGB 16.0  HCT 51.2  MCV 91.8  MCH 28.7  MCHC 31.3  RDW 16.6*  PLT 288   BNP Recent Labs  Lab 09/19/19 2100  BNP 1,446.6*    DDimer No results for input(s): DDIMER in the last 168 hours.   Radiology/Studies:  09/21/19 RENAL  Result Date: 10/19/2019 CLINICAL DATA:  Acute renal failure EXAM: RENAL / URINARY TRACT ULTRASOUND COMPLETE COMPARISON:  None. FINDINGS: Right Kidney: Renal measurements:  10.5 x 5.2 x 5.9 cm = volume: 167 mL .  Echogenicity within normal limits. No mass or hydronephrosis visualized. Left Kidney: Renal measurements: 11.7 x 4.8 x 5.5 cm = volume: 164 mL. Echogenicity within normal limits. No mass or hydronephrosis visualized. Bladder: Appears normal for degree of bladder distention. Other: Ascites seen in the abdomen and pelvis. IMPRESSION: No acute findings.  No hydronephrosis. Ascites. Electronically Signed   By: Charlett Nose M.D.   On: 09/21/2019 02:35   DG Chest Portable 1 View  Result Date: 09/19/2019 CLINICAL DATA:  Fluid retention, edema EXAM: PORTABLE CHEST 1 VIEW COMPARISON:  None FINDINGS: Cardiomegaly. Right lower lobe airspace opacity with probable layering effusion. No confluent opacity on the left. No acute bony abnormality. IMPRESSION: Layering right pleural effusion with right lower lobe airspace opacity which could reflect atelectasis or pneumonia. Cardiomegaly. Electronically Signed   By: Charlett Nose M.D.   On: 09/19/2019 21:33   ECHOCARDIOGRAM COMPLETE  Result Date: 09/20/2019    ECHOCARDIOGRAM REPORT   Patient Name:   MURRIEL EIDEM Date of Exam: 09/20/2019 Medical Rec #:  [id number]   Height: Accession #:    [id number]  Weight: Date of Birth:  12-Dec-1955   BSA: Patient Age:    63 years    BP:           142/81 mmHg Patient Gender: M           HR:           94 bpm. Exam Location:  Inpatient Procedure: 2D Echo, Cardiac Doppler and Color Doppler Indications:    CHF-Acute Systolic 428.21  History:        Patient has no prior history of Echocardiogram examinations.                 CHF; Risk Factors:Hypertension and Former Smoker.  Sonographer:    Renella Cunas RDCS Referring Phys: [id number] VASUNDHRA RATHORE IMPRESSIONS  1. Severe global reduction in LV systolic function; moderate LVE; grade 2 diastolic dysfunction; biatrial enlargement; severe RV dysfunction; mild TR with severe pulmonary hypertension.  2. Left ventricular ejection fraction, by estimation, is 20 to 25%.  The left ventricle has severely decreased function. The left ventricle demonstrates global hypokinesis. The left ventricular internal cavity size was moderately dilated. Left ventricular diastolic parameters are consistent with Grade II diastolic dysfunction (pseudonormalization). Elevated left atrial pressure.  3. Right ventricular systolic function is severely reduced. The right ventricular size is normal. There is severely elevated pulmonary artery systolic pressure.  4. Left atrial size was mildly dilated.  5. Right atrial size was moderately dilated.  6. The mitral valve is normal in structure. Trivial mitral valve regurgitation. No evidence of mitral stenosis.  7. The aortic valve is tricuspid. Aortic valve regurgitation is not visualized. No aortic stenosis is present.  8. The inferior vena cava is dilated in size with <50% respiratory variability, suggesting right atrial pressure of 15 mmHg. FINDINGS  Left Ventricle: Left ventricular ejection fraction, by estimation, is 20 to 25%. The left ventricle has severely decreased function. The left ventricle demonstrates global hypokinesis. The left ventricular internal cavity size was moderately dilated. There is no left ventricular hypertrophy. Left ventricular diastolic parameters are consistent with Grade II diastolic dysfunction (pseudonormalization). Elevated left atrial pressure. Right Ventricle: The right ventricular size is normal.Right ventricular systolic function is severely reduced. There is severely elevated pulmonary artery systolic pressure. The tricuspid regurgitant velocity is 3.71 m/s, and with an assumed right atrial  pressure of 15 mmHg, the estimated right ventricular systolic pressure is 70.1 mmHg. Left Atrium:  Left atrial size was mildly dilated. Right Atrium: Right atrial size was moderately dilated. Pericardium: There is no evidence of pericardial effusion. Mitral Valve: The mitral valve is normal in structure. Normal mobility of the mitral  valve leaflets. Trivial mitral valve regurgitation. No evidence of mitral valve stenosis. Tricuspid Valve: The tricuspid valve is normal in structure. Tricuspid valve regurgitation is mild . No evidence of tricuspid stenosis. Aortic Valve: The aortic valve is tricuspid. Aortic valve regurgitation is not visualized. No aortic stenosis is present. Pulmonic Valve: The pulmonic valve was normal in structure. Pulmonic valve regurgitation is mild. No evidence of pulmonic stenosis. Aorta: The aortic root is normal in size and structure. Venous: The inferior vena cava is dilated in size with less than 50% respiratory variability, suggesting right atrial pressure of 15 mmHg. IAS/Shunts: There is left bowing of the interatrial septum, suggestive of elevated right atrial pressure. No atrial level shunt detected by color flow Doppler. Additional Comments: Severe global reduction in LV systolic function; moderate LVE; grade 2 diastolic dysfunction; biatrial enlargement; severe RV dysfunction; mild TR with severe pulmonary hypertension. There is pleural effusion in the left lateral region.  LEFT VENTRICLE PLAX 2D LVIDd:         6.00 cm      Diastology LVIDs:         5.50 cm      LV e' lateral:   4.68 cm/s LV PW:         0.90 cm      LV E/e' lateral: 17.4 LV IVS:        0.90 cm      LV e' medial:    4.19 cm/s LVOT diam:     2.10 cm      LV E/e' medial:  19.4 LV SV:         43 LVOT Area:     3.46 cm  LV Volumes (MOD) LV vol d, MOD A2C: 175.0 ml LV vol d, MOD A4C: 198.0 ml LV vol s, MOD A2C: 150.0 ml LV vol s, MOD A4C: 148.0 ml LV SV MOD A2C:     25.0 ml LV SV MOD A4C:     198.0 ml LV SV MOD BP:      44.3 ml RIGHT VENTRICLE RV S prime:     9.65 cm/s TAPSE (M-mode): 1.3 cm LEFT ATRIUM             RIGHT ATRIUM LA diam:        5.30 cm RA Area:     30.10 cm LA Vol (A2C):   62.5 ml RA Volume:   106.00 ml LA Vol (A4C):   30.2 ml LA Biplane Vol: 46.3 ml  AORTIC VALVE LVOT Vmax:   80.90 cm/s LVOT Vmean:  50.300 cm/s LVOT VTI:    0.123 m   AORTA Ao Root diam: 3.10 cm MITRAL VALVE               TRICUSPID VALVE MV Area (PHT): 7.16 cm    TR Peak grad:   55.1 mmHg MV Decel Time: 106 msec    TR Vmax:        371.00 cm/s MV E velocity: 81.40 cm/s MV A velocity: 36.80 cm/s  SHUNTS MV E/A ratio:  2.21        Systemic VTI:  0.12 m                            Systemic  Diam: 2.10 cm Olga Millers MD Electronically signed by Olga Millers MD Signature Date/Time: 09/20/2019/1:00:43 PM    Final    US Abdomen Limited RUQ  Result Date: 09/20/2019 CLINICAL DATA:  Elevated LFTs. EXAM: ULTRASOUND ABDOMEN LIMITED RIGHT UPPER QUADRANT COMPARISON:  No prior. FINDINGS: Gallbladder: Not visualized Common bile duct: Diameter: Not visualized Liver: Nodular hepatic contour suggesting the possibility of cirrhosis. No definite mass noted. Portal vein not well visualized. Other: Moderate ascites. This exam was extremely limited due to patient's body habitus and overlying bowel gas. IMPRESSION: 1. Very limited exam due to patient's body habitus and overlying bowel gas. Gallbladder and common bile duct not visualized. 2. Very nodular hepatic contour suggesting cirrhosis. Moderate ascites. Electronically Signed   By: Maisie Fus  Register   On: 09/20/2019 07:35      Assessment and Plan:   1. Acute biventricular heart failure with severe pulmonary hypertension - severely volume overloaded on exam. Etiology unclear, may be hypertensive heart disease but will need R/LHC to evaluate coronaries and pulmonary pressure when more euvolemic. Suspect it will be several days before he is ready as he cannot lie flat. He is receiving Lasix 40mg  IV BID. He does not have full I/O's or any weights on file so will order to help assess progress. Creatinine may be a barrier to diuresis but for now has remained fairly stable. May need even higher dosing than current Lasix dose. Will review with MD. I will discontinue amlodipine and start low dose carvedilol (he does not appear shocky) - will  likely need higher dose but want to review diuretic regimen with MD first. Hold off ACEI/ARB/ARNI/spiro given hyperkalemia and renal insufficiency - can consider starting afterload reduction with nitrates/hydralazine if blood pressure remains elevated after starting carvedilol. 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  2. Essential HTN - blood pressure remains elevated. Stop amlodipine given LV dysfunction. Start carvedilol as above.  3. Renal insufficiency of unknown chronicity - Cr trends as above. Hovering around 2 and stable this admission. Nephrology on board.  4. Possible cirrhosis of liver with ascites - possibly due to CHF. Albumin preserved at 3.7. Will obtain baseline PT/INR with AM labs and defer usual workup otherwise to IM to exclude additional causes. Will need to establish with GI as outpatient.  5. Morbid obesity with suspected OSA - will request height/weight to calculate baseline BMI which will likely be moving target with diuresis. Recommend outpatient sleep study to evaluate for OSA. May have new oxygen needs upon discharge until he can have this done.   6. Diarrhea - not clearly related to cardiac status, workup per IM.  For questions or updates, please contact [facility] Please consult [email or web address] for contact info under     Signed, , PA-C  09/21/2019 11:55 AM   I have personally seen and examined this patient. I agree with the assessment and plan as outlined above.  64 yo male with history of HTN and recent medication non-compliance admitted with progressive dyspnea, weight gain and lower extremity edema. Echo with LVEF=20-25%, dilated LV, reduced RC systolic function with severe pulm HTN. He has been diuresing with IV Lasix with some improvement in lower extremity edema and dyspnea.  I have personally reviewed  his labs. Creatinine 2.1. BNP over 1400.  I have personally reviewed his EKG and it shows sinus rhythm  with poor R wave progression through the precordial leads.  I have personally reviewed his echo images. There is reduced LV and RV systolic function  My exam:  General: Well developed, well nourished, NAD  HEENT: OP clear, mucus membranes moist  SKIN: warm, dry. No rashes. Neuro: No focal deficits  Musculoskeletal: Muscle strength 5/5 all ext  Psychiatric: Mood and affect normal  Neck: Unable to assess JVD Lungs:Clear bilaterally, no wheezes, rhonci, crackles Cardiovascular: Regular rate and rhythm. No murmurs, gallops or rubs. Abdomen:Soft. Bowel sounds present. Distended. .  Extremities: 3+ bilateral lower extremity edema. Pulses are 2 + in the bilateral DP/PT.  Plan: Acute biventricular systolic CHF with Pulm HTN: He is grossly overloaded. Agree with IV lasix today. He will need further evaluation with a right and left heart catheterization next week. Will start Coreg. No ARB or Ace-inh today due to renal insufficiency. We will follow along with you.   Verne Carrow 09/21/2019 1:42 PM

## 2019-09-21 NOTE — Progress Notes (Signed)
Pt placed on 2Lo2 d/t 02 sats in the high 70s to Mid upper 80s. Pt did not appear to be in distress just sleeping. P twas awaken and  states that he does not have a history of osa and is not in distress. Will continue to monitor.

## 2019-09-21 NOTE — Progress Notes (Signed)
Admit: 09/19/2019 LOS: 1  61M presenting with anasarca, new BiV s/d acute CHF, CKD vs AKI  Subjective:  . Tells me that he remembered he was taking diclofenac daily up until about 10 days ago when he was told to stop . Currently on Lasix 40 IV twice daily, has made 1.4 L of urine thus far, no weights. . Remains markedly edematous into middle of his trunk but feels overall that swelling has improved . Heart rate remains 90s to 100s, blood pressures are stable. . Renal ultrasound was without mass or obstruction . UA with 1+ proteinuria but no hematuria or pyuria . Abdominal imaging with moderate ascites . Cardiology to see today . K4.7 HCO3 30  03/25 0701 - 03/26 0700 In: 240 [P.O.:240] Out: 300 [Urine:300]  Filed Weights   09/21/19 1248  Weight: (!) 170.4 kg    Scheduled Meds: . carvedilol  3.125 mg Oral BID WC  . furosemide  40 mg Intravenous TID  . heparin  5,000 Units Subcutaneous Q8H  . sodium chloride flush  3 mL Intravenous Q12H   Continuous Infusions: . sodium chloride     PRN Meds:.sodium chloride, acetaminophen, sodium chloride flush  Current Labs: reviewed    Physical Exam:  Blood pressure 127/82, pulse (!) 105, temperature 97.7 F (36.5 C), temperature source Oral, resp. rate 18, height 6\' 1"  (1.854 m), weight (!) 170.4 kg, SpO2 99 %. Morbidly obese, in bed, mild increased work of breathing Diminished in the bases Tachycardic, regular, normal S1 and S2 4+ edema in the legs, pitting edema in the abdomen up towards the epigastric region Nonfocal, CN II through XII intact No rashes or lesions  A 1. Suspected CKD 3, less likely as AKI; negative renal imaging for obstruction or masses at admission 2. Acute systolic/diastolic biventricular CHF exacerbation with hypervolemia/anasarca/ascites 3. 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 Hypertension, currently controlled  P . Increased dosing of  furosemide to 40 mg 3 times daily, if inadequate UOP with increased dosing to 80 mg 3 times daily . Continue fluid and sodium restriction . Cardiology to evaluate, titrate medications . Daily weights, Daily Renal Panel, Strict I/Os, Avoid nephrotoxins (NSAIDs, judicious IV Contrast)    MD 09/21/2019, 1:40 PM  Recent Labs  Lab 09/20/19 0317 09/20/19 1453 09/21/19 0529  NA 140 142 142  K 5.2* 4.5 4.7  CL 99 102 103  CO2 28 32 30  GLUCOSE 72 101* 99  BUN 27* 28* 27*  CREATININE 2.01* 1.95* 2.10*  CALCIUM 8.5* 8.5* 8.6*  PHOS  --  4.5 5.2*   Recent Labs  Lab 09/19/19 2100  WBC 5.1  HGB 16.0  HCT 51.2  MCV 91.8  PLT 288

## 2019-09-22 ENCOUNTER — Inpatient Hospital Stay (HOSPITAL_COMMUNITY): Payer: BLUE CROSS/BLUE SHIELD

## 2019-09-22 DIAGNOSIS — I10 Essential (primary) hypertension: Secondary | ICD-10-CM

## 2019-09-22 DIAGNOSIS — I509 Heart failure, unspecified: Secondary | ICD-10-CM

## 2019-09-22 LAB — CBC
HCT: 49.1 % (ref 39.0–52.0)
Hemoglobin: 15.2 g/dL (ref 13.0–17.0)
MCH: 28.3 pg (ref 26.0–34.0)
MCHC: 31 g/dL (ref 30.0–36.0)
MCV: 91.4 fL (ref 80.0–100.0)
Platelets: 228 10*3/uL (ref 150–400)
RBC: 5.37 MIL/uL (ref 4.22–5.81)
RDW: 16.3 % — ABNORMAL HIGH (ref 11.5–15.5)
WBC: 6.6 10*3/uL (ref 4.0–10.5)
nRBC: 0 % (ref 0.0–0.2)

## 2019-09-22 LAB — RENAL FUNCTION PANEL
Albumin: 3.3 g/dL — ABNORMAL LOW (ref 3.5–5.0)
Anion gap: 15 (ref 5–15)
BUN: 24 mg/dL — ABNORMAL HIGH (ref 8–23)
CO2: 33 mmol/L — ABNORMAL HIGH (ref 22–32)
Calcium: 8.6 mg/dL — ABNORMAL LOW (ref 8.9–10.3)
Chloride: 97 mmol/L — ABNORMAL LOW (ref 98–111)
Creatinine, Ser: 1.81 mg/dL — ABNORMAL HIGH (ref 0.61–1.24)
GFR calc Af Amer: 46 mL/min — ABNORMAL LOW (ref >60)
GFR calc non Af Amer: 39 mL/min — ABNORMAL LOW (ref >60)
Glucose, Bld: 75 mg/dL (ref 70–99)
Phosphorus: 4.5 mg/dL (ref 2.5–4.6)
Potassium: 4.2 mmol/L (ref 3.5–5.1)
Sodium: 145 mmol/L (ref 135–145)

## 2019-09-22 LAB — LIPID PANEL
Cholesterol: 150 mg/dL (ref 0–200)
HDL: 33 mg/dL — ABNORMAL LOW (ref >40)
LDL Cholesterol: 101 mg/dL — ABNORMAL HIGH (ref 0–99)
Total CHOL/HDL Ratio: 4.5 RATIO
Triglycerides: 81 mg/dL (ref <150)
VLDL: 16 mg/dL (ref 0–40)

## 2019-09-22 LAB — PROTIME-INR
INR: 1.5 — ABNORMAL HIGH (ref 0.8–1.2)
Prothrombin Time: 18.1 seconds — ABNORMAL HIGH (ref 11.4–15.2)

## 2019-09-22 MED ORDER — ISOSORB DINITRATE-HYDRALAZINE 20-37.5 MG PO TABS
1.0000 | ORAL_TABLET | Freq: Three times a day (TID) | ORAL | Status: DC
  Administered 2019-09-22 (×2): 1 via ORAL
  Filled 2019-09-22 (×4): qty 1

## 2019-09-22 MED ORDER — CHLORHEXIDINE GLUCONATE CLOTH 2 % EX PADS
6.0000 | MEDICATED_PAD | Freq: Every day | CUTANEOUS | Status: DC
  Administered 2019-09-22 – 2019-09-25 (×4): 6 via TOPICAL

## 2019-09-22 MED ORDER — LIDOCAINE 5 % EX PTCH
1.0000 | MEDICATED_PATCH | CUTANEOUS | Status: DC
  Administered 2019-09-22 – 2019-10-03 (×12): 1 via TRANSDERMAL
  Filled 2019-09-22 (×13): qty 1

## 2019-09-22 NOTE — Plan of Care (Signed)
Nutrition Education Note  RD consulted for nutrition education regarding low sodium fluid restricted diet education for exacerbation of CHF.  RD working remotely and spoke with patient via phone this afternoon. Patient reports low sodium, fluid restricted diet at home. He recalls purchasing frozen or fresh fruits and vegetables, eats chicken and [place], stated he will occasionally eat beef if he can afford it and limits is daily fluid intake. He endorses no po intake for the last 3 days pta secondary to abdominal pain, but typically eats 2-3 meals/day.  RD attached "Low Sodium Nutrition Therapy" handout from the [employer] to discharge instructions. Reviewed patient's dietary recall. Provided examples on ways to decrease sodium intake in diet. Discouraged intake of processed foods and use of salt shaker. Encouraged patient's continued efforts of consuming fresh fruits and vegetables as well as whole grain sources of carbohydrates to maximize fiber intake.   RD discussed why it is important for patient to adhere to diet recommendations, and emphasized the role of fluids, foods to avoid, and importance of weighing self daily. Teach back method used.  Expect good compliance.  Body mass index is 47.7 kg/m. Pt meets criteria for morbid obesity based on current BMI.  Current diet order is HH/1200, patient is consuming approximately 100% of meals at this time. Labs and medications reviewed. No further nutrition interventions warranted at this time. RD contact information provided. If additional nutrition issues arise, please re-consult RD.   Lars Masson, RD, LDN Clinical Nutrition After Hours/Weekend Pager # in Amion

## 2019-09-22 NOTE — Progress Notes (Signed)
Admit: 09/19/2019 LOS: 2  41M presenting with anasarca, new BiV s/d acute CHF, CKD vs AKI  Subjective:  . 4.1L UOP, 3.4L neg, no AM weight yet . AM labs pending . C/o b/l knee pain . Still with anasarca . Cardiology notes reviewed, will need ischemia w/u at some point  03/26 0701 - 03/27 0700 In: 720 [P.O.:720] Out: 4075 [Urine:4075]  Filed Weights   09/21/19 1248  Weight: (!) 170.4 kg    Scheduled Meds: . carvedilol  3.125 mg Oral BID WC  . furosemide  40 mg Intravenous TID  . heparin  5,000 Units Subcutaneous Q8H  . lidocaine  1 patch Transdermal Q24H  . sodium chloride flush  3 mL Intravenous Q12H   Continuous Infusions: . sodium chloride     PRN Meds:.sodium chloride, acetaminophen, sodium chloride flush  Current Labs: reviewed    Physical Exam:  Blood pressure (!) 149/92, pulse (!) 109, temperature 97.9 F (36.6 C), temperature source Oral, resp. rate 16, height 6\' 1"  (1.854 m), weight (!) 170.4 kg, SpO2 100 %. Morbidly obese, in bed, nl work of breathing Diminished in the bases Tachycardic, regular, normal S1 and S2 4+ edema in the legs, pitting edema in the abdomen up towards the epigastric region Nonfocal, CN II through XII intact No rashes or lesions  A 1. Suspected CKD 3, less likely as AKI; negative renal imaging for obstruction or masses at admission 2. Acute systolic/diastolic biventricular CHF exacerbation with hypervolemia/anasarca/ascites 3. 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 Hypertension, currently controlled 6. Daily NSAID use recently stopped for pain in knees  P . Cont 40 IV TID lasix; effective thus far . Continue fluid and sodium restriction; will ask dietitian to meet and discuss with him . Daily weights, Daily Renal Panel, Strict I/Os, Avoid nephrotoxins (NSAIDs, judicious IV Contrast)    MD 09/22/2019, 6:14 AM  Recent Labs  Lab 09/20/19 0317 09/20/19 1453  09/21/19 0529  NA 140 142 142  K 5.2* 4.5 4.7  CL 99 102 103  CO2 28 32 30  GLUCOSE 72 101* 99  BUN 27* 28* 27*  CREATININE 2.01* 1.95* 2.10*  CALCIUM 8.5* 8.5* 8.6*  PHOS  --  4.5 5.2*   Recent Labs  Lab 09/19/19 2100  WBC 5.1  HGB 16.0  HCT 51.2  MCV 91.8  PLT 288

## 2019-09-22 NOTE — Progress Notes (Signed)
Occupational Therapy Evaluation Patient Details Name: Jack Chavez MRN: [id number] DOB: [birth year] Today's Date: 09/22/2019    History of Present Illness 64 year old with past medical history significant for hypertension morbid obesity who presents complaining of worsening shortness of breath on exertion and peripheral edema. Acute dx of Acute exacerbation of CHF    Clinical Impression   PTA pt reports performing all self-care tasks and functional mobility with Modified Independence using SPC to mobilize. Pt reports no falls in the last 3 months, but difficulty with mobilization from swelling in B LEs. Overall pt requires setup to total assist for self-care tasks and Max A x 2 (total) for functional mobility. Pt was only able to roll to left side of bed, before requesting to return to supine secondary to increased pain in R knee. Medical staff is aware of R knee pain. Pt was able to complete grooming with HOB elevated to 80 degrees. O2 stats in the 90s on 2L of nasal cannula during evaluation. Pt will benefit from continued skilled acute OT services to decrease level of assist with self-care tasks and functional mobility.     Follow Up Recommendations  CIR;SNF(pending patient's progress with acute therapy services)    Equipment Recommendations  Tub/shower bench    Recommendations for Other Services       Precautions / Restrictions Precautions Precautions: Fall Precaution Comments: monitor O2 stats Restrictions Weight Bearing Restrictions: No      Mobility Bed Mobility Overal bed mobility: Needs Assistance Bed Mobility: Rolling Rolling: Max assist         General bed mobility comments: increase in R knee pain during supine to sit attempt   Transfers Overall transfer level: Needs assistance               General transfer comment: not able to assess due to increased R knee pain during rolling to left side     Balance                                            ADL either performed or assessed with clinical judgement   ADL Overall ADL's : Needs assistance/impaired Eating/Feeding: Set up   Grooming: Oral care;Wash/dry face;Wash/dry hands;Set up;Bed level   Upper Body Bathing: Minimal assistance   Lower Body Bathing: Total assistance   Upper Body Dressing : Moderate assistance   Lower Body Dressing: Total assistance   Toilet Transfer: Total assistance;+2 for physical assistance;+2 for safety/equipment   Toileting- Clothing Manipulation and Hygiene: Total assistance;+2 for safety/equipment;+2 for physical assistance       Functional mobility during ADLs: Total assistance;+2 for physical assistance;+2 for safety/equipment       Vision Baseline Vision/History: No visual deficits       Perception     Praxis      Pertinent Vitals/Pain Pain Assessment: 0-10 Pain Score: 8  Pain Location: R KNEE Pain Descriptors / Indicators: Heaviness;Aching Pain Intervention(s): Repositioned     Hand Dominance Right   Extremity/Trunk Assessment Upper Extremity Assessment Upper Extremity Assessment: RUE deficits/detail;LUE deficits/detail RUE: (Grossly 3+/5, AROM WFL ) LUE: (Grossly 3+/5, AROM WFL )   Lower Extremity Assessment Lower Extremity Assessment: Defer to PT evaluation       Communication Communication Communication: No difficulties   Cognition Arousal/Alertness: Awake/alert Behavior During Therapy: WFL for tasks assessed/performed Overall Cognitive Status: Within Functional Limits for tasks assessed  General Comments       Exercises     Shoulder Instructions      Home Living Family/patient expects to be discharged to:: Private residence Living Arrangements: Other relatives Available Help at Discharge: Other (Comment)(pt states sister could physically assist ) Type of Home: House Home Access: Stairs to enter [employer] of Steps: 9 Entrance  Stairs-Rails: Right Home Layout: One level     Bathroom Shower/Tub: Chief Strategy Officer: Handicapped height Bathroom Accessibility: Yes   Home Equipment: Cane - single point;Walker - 2 wheels          Prior Functioning/Environment Level of Independence: Independent with assistive device(s)        Comments: SPC to mobilize         OT Problem List:        OT Treatment/Interventions:      OT Goals(Current goals can be found in the care plan section) Acute Rehab OT Goals Patient Stated Goal: to get my strength back OT Goal Formulation: With patient Time For Goal Achievement: 10/06/19 Potential to Achieve Goals: Fair ADL Goals Pt Will Perform Lower Body Dressing: with max assist;sit to/from stand;with adaptive equipment Pt Will Transfer to Toilet: bedside commode;with max assist Pt Will Perform Toileting - Clothing Manipulation and hygiene: with max assist Pt/caregiver will Perform Home Exercise Program: With written HEP provided;Increased strength;Both right and left upper extremity  OT Frequency:     Barriers to D/C:            Co-evaluation              AM-PAC OT "6 Clicks" Daily Activity     Outcome Measure Help from another person eating meals?: A Little Help from another person taking care of personal grooming?: A Little Help from another person toileting, which includes using toliet, bedpan, or urinal?: Total Help from another person bathing (including washing, rinsing, drying)?: A Lot Help from another person to put on and taking off regular upper body clothing?: A Lot Help from another person to put on and taking off regular lower body clothing?: Total 6 Click Score: 12   End of Session    Activity Tolerance:   Patient left:                     Time: 7782-4235 OT Time Calculation (min): 19 min Charges:  OT General Charges $OT Visit: 1 Visit OT Evaluation $OT Eval Moderate Complexity: 1 Mod  Neesa Knapik OTR/L   Giovannie Scerbo 09/22/2019, 3:02 PM

## 2019-09-22 NOTE — Progress Notes (Signed)
PROGRESS NOTE    Jack Chavez  BJS:[id number] DOB: 1956/06/12 DOA: 09/19/2019 PCP: Patient, No Pcp Per   Brief Narrative: 64 year old with past medical history significant for hypertension morbid obesity who presents complaining of worsening shortness of breath on exertion and peripheral edema.  He reports 10 days history of shortness of breath on exertion.  He also reports abdominal distention.  He ran of his hydrochlorothiazide for a month.  He reports diarrhea.  He went to an urgent care center and was advised to come to the ED due to abnormal kidney function.   Assessment & Plan:   Principal Problem:   Acute exacerbation of CHF (congestive heart failure) (HCC) Active Problems:   Renal insufficiency   Hypokalemia   Serum total bilirubin elevated   Hypertension   Acute systolic CHF (congestive heart failure), NYHA class 4 (HCC)  1-Acute systolic and diastolic heart failure exacerbation Continue with IV Lasix 40 mg IV TID. Echocardiogram showed ejection fraction 20 to 60% diastolic dysfunction, generalized hypokinesis.  Cardiology consulted. Patient will required right and left [place] cath.  Daily weight.  Negative 3.3 L.  Cardiology started hydralazine/Imdur.    2-CKD stage 3, pre nephrology , less likely AKI Continue with lasix. Increase to 40 mg TID Nephrology consulted.  Cr stable.  Place foley, dermitis  scrotum skin due to incontinence of urine.   3-Acute hypoxic respiratory failure, requiring oxygen supplementation.  Oxygen decreased to 70.  In setting heart failure.  Continue with IV lasix   Diarrhea; report 3 episode watery stool daily.  GI pathogen ordered.  No further diarrhea in the hospital  Mild elevation Bili;  [place]; Very nodular hepatic contour suggesting cirrhosis. Moderate ascites. Ascites; might need paracentesis.   HTN; Continue with lasix.  Knee pain; check x ray.    Hyperkalemia; received lokelma   Estimated body mass index is 49.56 kg/m as  calculated from the following:   Height as of this encounter: 6\' 1"  (1.854 m).   Weight as of this encounter: 170.4 kg.   DVT prophylaxis:  Heparin  Code Status: full code Family Communication: care discussed with patient.  Disposition Plan:  Patient is from: home  Anticipated d/c date: in 2--3 days  Barriers to d/c or necessity for inpatient status: remain in the hospital for management of acute Systolic HF, requiring IV lasix.   Consultants:   Cardiology  Nephrology   Procedures:   ECHO Systolic and diastolic HF, Ef 73%   Antimicrobials:    Subjective: Still with SOB, complaints of right knee pain.   Objective: Vitals:   09/21/19 1248 09/21/19 1257 09/21/19 2005 09/22/19 1335  BP:  127/82 (!) 149/92 (!) 95/55  Pulse:  (!) 105 (!) 109 (!) 105  Resp:  18 16 19   Temp:  97.7 F (36.5 C) 97.9 F (36.6 C) 97.7 F (36.5 C)  TempSrc:  Oral Oral Oral  SpO2:  99% 100% 99%  Weight: (!) 170.4 kg     Height: 6\' 1"  (1.854 m)       Intake/Output Summary (Last 24 hours) at 09/22/2019 1415 Last data filed at 09/22/2019 1413 Gross per 24 hour  Intake 603 ml  Output 2975 ml  Net -2372 ml   Filed Weights   09/21/19 1248  Weight: (!) 170.4 kg    Examination:  General exam: NAD Respiratory system: Bilateral crackles.  Cardiovascular system: S 1, S 2 RRR Gastrointestinal system: BS present, soft, nt, umbilical hernia Central nervous system: non focal Extremities: Plus 2 edema  Data Reviewed: I have personally reviewed following labs and imaging studies  CBC: Recent Labs  Lab 09/19/19 2100 09/22/19 0556  WBC 5.1 6.6  HGB 16.0 15.2  HCT 51.2 49.1  MCV 91.8 91.4  PLT 288 228   Basic Metabolic Panel: Recent Labs  Lab 09/19/19 2100 09/20/19 0317 09/20/19 1453 09/21/19 0529 09/22/19 0556  NA 139 140 142 142 145  K 5.4* 5.2* 4.5 4.7 4.2  CL 102 99 102 103 97*  CO2 27 28 32 30 33*  GLUCOSE 79 72 101* 99 75  BUN 26* 27* 28* 27* 24*  CREATININE 2.07*  2.01* 1.95* 2.10* 1.81*  CALCIUM 8.7* 8.5* 8.5* 8.6* 8.6*  PHOS  --   --  4.5 5.2* 4.5   GFR: Estimated Creatinine Clearance: 70.4 mL/min (A) (by C-G formula based on SCr of 1.81 mg/dL (H)). Liver Function Tests: Recent Labs  Lab 09/19/19 2100 09/20/19 1453 09/21/19 0529 09/22/19 0556  AST 44*  --   --   --   ALT 21  --   --   --   ALKPHOS 66  --   --   --   BILITOT 2.1*  --   --   --   PROT 7.3  --   --   --   ALBUMIN 3.7 3.2* 3.4* 3.3*   Recent Labs  Lab 09/19/19 2100  LIPASE 36   No results for input(s): AMMONIA in the last 168 hours. Coagulation Profile: Recent Labs  Lab 09/22/19 0556  INR 1.5*   Cardiac Enzymes: No results for input(s): CKTOTAL, CKMB, CKMBINDEX, TROPONINI in the last 168 hours. BNP (last 3 results) No results for input(s): PROBNP in the last 8760 hours. HbA1C: No results for input(s): HGBA1C in the last 72 hours. CBG: No results for input(s): GLUCAP in the last 168 hours. Lipid Profile: Recent Labs    09/22/19 0556  CHOL 150  HDL 33*  LDLCALC 101*  TRIG 81  CHOLHDL 4.5   Thyroid Function Tests: Recent Labs    09/21/19 0857  TSH 0.710   Anemia Panel: No results for input(s): VITAMINB12, FOLATE, FERRITIN, TIBC, IRON, RETICCTPCT in the last 72 hours. Sepsis Labs: Recent Labs  Lab 09/20/19 0317  PROCALCITON <0.10    Recent Results (from the past 240 hour(s))  SARS CORONAVIRUS 2 (TAT 6-24 HRS) Nasopharyngeal Nasopharyngeal Swab     Status: None   Collection Time: 09/19/19 11:38 PM   Specimen: Nasopharyngeal Swab  Result Value Ref Range Status   SARS Coronavirus 2 NEGATIVE NEGATIVE Final    Comment: (NOTE) SARS-CoV-2 target nucleic acids are NOT DETECTED. The SARS-CoV-2 RNA is generally detectable in upper and lower respiratory specimens during the acute phase of infection. Negative results do not preclude SARS-CoV-2 infection, do not rule out co-infections with other pathogens, and should not be used as the sole basis for  treatment or other patient management decisions. Negative results must be combined with clinical observations, patient history, and epidemiological information. The expected result is Negative. Fact Sheet for Patients: HairSlick.no Fact Sheet for Healthcare Providers: quierodirigir.com This test is not yet approved or cleared by the [place] FDA and  has been authorized for detection and/or diagnosis of SARS-CoV-2 by FDA under an Emergency Use Authorization (EUA). This EUA will remain  in effect (meaning this test can be used) for the duration of the COVID-19 declaration under Section 56 4(b)(1) of the Act, 21 U.S.C. section 360bbb-3(b)(1), unless the authorization is terminated or revoked sooner. Performed at  [facility], [address] [place], [place] [address]          Radiology Studies: US RENAL  Result Date: 09/21/2019 CLINICAL DATA:  Acute renal failure EXAM: RENAL / URINARY TRACT ULTRASOUND COMPLETE COMPARISON:  None. FINDINGS: Right Kidney: Renal measurements: 10.5 x 5.2 x 5.9 cm = volume: 167 mL . Echogenicity within normal limits. No mass or hydronephrosis visualized. Left Kidney: Renal measurements: 11.7 x 4.8 x 5.5 cm = volume: 164 mL. Echogenicity within normal limits. No mass or hydronephrosis visualized. Bladder: Appears normal for degree of bladder distention. Other: Ascites seen in the abdomen and pelvis. IMPRESSION: No acute findings.  No hydronephrosis. Ascites. Electronically Signed   By: Charlett Nose M.D.   On: 09/21/2019 02:35        Scheduled Meds: . carvedilol  3.125 mg Oral BID WC  . Chlorhexidine Gluconate Cloth  6 each Topical Daily  . furosemide  40 mg Intravenous TID  . heparin  5,000 Units Subcutaneous Q8H  . isosorbide-hydrALAZINE  1 tablet Oral TID  . lidocaine  1 patch Transdermal Q24H  . sodium chloride flush  3 mL Intravenous Q12H   Continuous Infusions: . sodium chloride        LOS: 2 days    Time spent: 35 minutes    Deltha Bernales A Abid Bolla, MD [facility]   If 7PM-7AM, please contact night-coverage [email or web address]  09/22/2019, 2:15 PM

## 2019-09-22 NOTE — Progress Notes (Signed)
Progress Note  Patient Name: Jack Chavez Date of Encounter: 09/22/2019  Primary Cardiologist: Verne Carrow, MD    Subjective   Is a 64 year old gentleman who presented to the hospital with anasarca, biventricular acute systolic and diastolic congestive heart failure, CKD.  He continues to diurese.  He is net -3.4 L so far during this admission.  Patient has a history of hypertension.  he has been noncompliant with his medications.     Inpatient Medications    Scheduled Meds: . carvedilol  3.125 mg Oral BID WC  . furosemide  40 mg Intravenous TID  . heparin  5,000 Units Subcutaneous Q8H  . lidocaine  1 patch Transdermal Q24H  . sodium chloride flush  3 mL Intravenous Q12H   Continuous Infusions: . sodium chloride     PRN Meds: sodium chloride, acetaminophen, sodium chloride flush   Vital Signs    Vitals:   09/21/19 0441 09/21/19 1248 09/21/19 1257 09/21/19 2005  BP: (!) 163/98  127/82 (!) 149/92  Pulse: 95  (!) 105 (!) 109  Resp: 14  18 16   Temp: 98.9 F (37.2 C)  97.7 F (36.5 C) 97.9 F (36.6 C)  TempSrc:   Oral Oral  SpO2: 99%  99% 100%  Weight:  (!) 170.4 kg    Height:  6\' 1"  (1.854 m)      Intake/Output Summary (Last 24 hours) at 09/22/2019 Last data filed at 09/21/2019 1935 Gross per 24 hour  Intake 720 ml  Output 4075 ml  Net -3355 ml   Last 3 Weights 09/21/2019  Weight (lbs) 375 lb 10.6 oz  Weight (kg) 170.4 kg      Telemetry    Sinus tach  - Personally Reviewed  ECG      - Personally Reviewed  Physical Exam   GEN:  Middle-aged, obese gentleman, no acute distress. Neck: No JVD Cardiac: RRR, no murmurs, rubs, or gallops.  Respiratory: Clear to auscultation bilaterally. GI: Soft, nontender, non-distended  MS: Bilaterally.. Neuro:  Nonfocal  Psych: Normal affect   Labs    High Sensitivity Troponin:  No results for input(s): TROPONINIHS in the last 720 hours.    Chemistry Recent Labs  Lab 09/19/19 2100  09/20/19 0317 09/20/19 1453 09/21/19 0529 09/22/19 0556  NA 139   < > 142 142 145  K 5.4*   < > 4.5 4.7 4.2  CL 102   < > 102 103 97*  CO2 27   < > 32 30 33*  GLUCOSE 79   < > 101* 99 75  BUN 26*   < > 28* 27* 24*  CREATININE 2.07*   < > 1.95* 2.10* 1.81*  CALCIUM 8.7*   < > 8.5* 8.6* 8.6*  PROT 7.3  --   --   --   --   ALBUMIN 3.7  --  3.2* 3.4* 3.3*  AST 44*  --   --   --   --   ALT 21  --   --   --   --   ALKPHOS 66  --   --   --   --   BILITOT 2.1*  --   --   --   --   GFRNONAA 34*   < > 36* 33* 39*  GFRAA 39*   < > 42* 38* 46*  ANIONGAP 10   < > 8 9 15    < > = values in this interval not displayed.     Hematology Recent  Labs  Lab 09/19/19 2100 09/22/19 0556  WBC 5.1 6.6  RBC 5.58 5.37  HGB 16.0 15.2  HCT 51.2 49.1  MCV 91.8 91.4  MCH 28.7 28.3  MCHC 31.3 31.0  RDW 16.6* 16.3*  PLT 288 228    BNP Recent Labs  Lab 09/19/19 2100  BNP 1,446.6*     DDimer No results for input(s): DDIMER in the last 168 hours.   Radiology    US RENAL  Result Date: 09/21/2019 CLINICAL DATA:  Acute renal failure EXAM: RENAL / URINARY TRACT ULTRASOUND COMPLETE COMPARISON:  None. FINDINGS: Right Kidney: Renal measurements: 10.5 x 5.2 x 5.9 cm = volume: 167 mL . Echogenicity within normal limits. No mass or hydronephrosis visualized. Left Kidney: Renal measurements: 11.7 x 4.8 x 5.5 cm = volume: 164 mL. Echogenicity within normal limits. No mass or hydronephrosis visualized. Bladder: Appears normal for degree of bladder distention. Other: Ascites seen in the abdomen and pelvis. IMPRESSION: No acute findings.  No hydronephrosis. Ascites. Electronically Signed   By: Charlett Nose M.D.   On: 09/21/2019 02:35   ECHOCARDIOGRAM COMPLETE  Result Date: 09/20/2019    ECHOCARDIOGRAM REPORT   Patient Name:   Jack Chavez Date of Exam: 09/20/2019 Medical Rec #:  [id number]   Height: Accession #:    [id number]  Weight: Date of Birth:  [birth year]   BSA: Patient Age:    63 years    BP:            142/81 mmHg Patient Gender: M           HR:           94 bpm. Exam Location:  Inpatient Procedure: 2D Echo, Cardiac Doppler and Color Doppler Indications:    CHF-Acute Systolic 428.21  History:        Patient has no prior history of Echocardiogram examinations.                 CHF; Risk Factors:Hypertension and Former Smoker.  Sonographer:    Renella Cunas RDCS Referring Phys: [id number] VASUNDHRA RATHORE IMPRESSIONS  1. Severe global reduction in LV systolic function; moderate LVE; grade 2 diastolic dysfunction; biatrial enlargement; severe RV dysfunction; mild TR with severe pulmonary hypertension.  2. Left ventricular ejection fraction, by estimation, is 20 to 25%. The left ventricle has severely decreased function. The left ventricle demonstrates global hypokinesis. The left ventricular internal cavity size was moderately dilated. Left ventricular diastolic parameters are consistent with Grade II diastolic dysfunction (pseudonormalization). Elevated left atrial pressure.  3. Right ventricular systolic function is severely reduced. The right ventricular size is normal. There is severely elevated pulmonary artery systolic pressure.  4. Left atrial size was mildly dilated.  5. Right atrial size was moderately dilated.  6. The mitral valve is normal in structure. Trivial mitral valve regurgitation. No evidence of mitral stenosis.  7. The aortic valve is tricuspid. Aortic valve regurgitation is not visualized. No aortic stenosis is present.  8. The inferior vena cava is dilated in size with <50% respiratory variability, suggesting right atrial pressure of 15 mmHg. FINDINGS  Left Ventricle: Left ventricular ejection fraction, by estimation, is 20 to 25%. The left ventricle has severely decreased function. The left ventricle demonstrates global hypokinesis. The left ventricular internal cavity size was moderately dilated. There is no left ventricular hypertrophy. Left ventricular diastolic parameters are consistent with  Grade II diastolic dysfunction (pseudonormalization). Elevated left atrial pressure. Right Ventricle: The right ventricular size is normal.Right ventricular systolic function is severely  reduced. There is severely elevated pulmonary artery systolic pressure. The tricuspid regurgitant velocity is 3.71 m/s, and with an assumed right atrial  pressure of 15 mmHg, the estimated right ventricular systolic pressure is 33.8 mmHg. Left Atrium: Left atrial size was mildly dilated. Right Atrium: Right atrial size was moderately dilated. Pericardium: There is no evidence of pericardial effusion. Mitral Valve: The mitral valve is normal in structure. Normal mobility of the mitral valve leaflets. Trivial mitral valve regurgitation. No evidence of mitral valve stenosis. Tricuspid Valve: The tricuspid valve is normal in structure. Tricuspid valve regurgitation is mild . No evidence of tricuspid stenosis. Aortic Valve: The aortic valve is tricuspid. Aortic valve regurgitation is not visualized. No aortic stenosis is present. Pulmonic Valve: The pulmonic valve was normal in structure. Pulmonic valve regurgitation is mild. No evidence of pulmonic stenosis. Aorta: The aortic root is normal in size and structure. Venous: The inferior vena cava is dilated in size with less than 50% respiratory variability, suggesting right atrial pressure of 15 mmHg. IAS/Shunts: There is left bowing of the interatrial septum, suggestive of elevated right atrial pressure. No atrial level shunt detected by color flow Doppler. Additional Comments: Severe global reduction in LV systolic function; moderate LVE; grade 2 diastolic dysfunction; biatrial enlargement; severe RV dysfunction; mild TR with severe pulmonary hypertension. There is pleural effusion in the left lateral region.  LEFT VENTRICLE PLAX 2D LVIDd:         6.00 cm      Diastology LVIDs:         5.50 cm      LV e' lateral:   4.68 cm/s LV PW:         0.90 cm      LV E/e' lateral: 17.4 LV IVS:         0.90 cm      LV e' medial:    4.19 cm/s LVOT diam:     2.10 cm      LV E/e' medial:  19.4 LV SV:         43 LVOT Area:     3.46 cm  LV Volumes (MOD) LV vol d, MOD A2C: 175.0 ml LV vol d, MOD A4C: 198.0 ml LV vol s, MOD A2C: 150.0 ml LV vol s, MOD A4C: 148.0 ml LV SV MOD A2C:     25.0 ml LV SV MOD A4C:     198.0 ml LV SV MOD BP:      44.3 ml RIGHT VENTRICLE RV S prime:     9.65 cm/s TAPSE (M-mode): 1.3 cm LEFT ATRIUM             RIGHT ATRIUM LA diam:        5.30 cm RA Area:     30.10 cm LA Vol (A2C):   62.5 ml RA Volume:   106.00 ml LA Vol (A4C):   30.2 ml LA Biplane Vol: 46.3 ml  AORTIC VALVE LVOT Vmax:   80.90 cm/s LVOT Vmean:  50.300 cm/s LVOT VTI:    0.123 m  AORTA Ao Root diam: 3.10 cm MITRAL VALVE               TRICUSPID VALVE MV Area (PHT): 7.16 cm    TR Peak grad:   55.1 mmHg MV Decel Time: 106 msec    TR Vmax:        371.00 cm/s MV E velocity: 81.40 cm/s MV A velocity: 36.80 cm/s  SHUNTS MV E/A ratio:  2.21  Systemic VTI:  0.12 m                            Systemic Diam: 2.10 cm Olga Millers MD Electronically signed by Olga Millers MD Signature Date/Time: 09/20/2019/1:00:43 PM    Final     Cardiac Studies     Patient Profile     64 y.o. male with acute on chronic systolic and diastolic congestive heart failure, acute on  chronic kidney disease  Assessment & Plan    1.  Acute biventricular CHF with severe pulmonary hypertension: Presents with significant volume overload and anasarca.  He has chronic kidney disease.  He is diuresed well and is slowly improving. His renal function improves then he will likely need a heart catheterization.  Otherwise we may have to proceed with a Myoview study for his ischemic work-up. BP is still elevated.  Will add hydralazine and isosorbide   Renal Insufficiency:  Chronicity is unknown .  His creatinine is gradually improving.         For questions or updates, please contact [facility] Please consult [email or web address] for contact info  under        Signed, Kristeen Miss, MD  09/22/2019, 8:14 AM

## 2019-09-22 NOTE — Discharge Instructions (Signed)

## 2019-09-23 DIAGNOSIS — M25461 Effusion, right knee: Secondary | ICD-10-CM

## 2019-09-23 LAB — RENAL FUNCTION PANEL
Albumin: 3.1 g/dL — ABNORMAL LOW (ref 3.5–5.0)
Anion gap: 12 (ref 5–15)
BUN: 24 mg/dL — ABNORMAL HIGH (ref 8–23)
CO2: 39 mmol/L — ABNORMAL HIGH (ref 22–32)
Calcium: 8.4 mg/dL — ABNORMAL LOW (ref 8.9–10.3)
Chloride: 95 mmol/L — ABNORMAL LOW (ref 98–111)
Creatinine, Ser: 1.66 mg/dL — ABNORMAL HIGH (ref 0.61–1.24)
GFR calc Af Amer: 51 mL/min — ABNORMAL LOW (ref >60)
GFR calc non Af Amer: 44 mL/min — ABNORMAL LOW (ref >60)
Glucose, Bld: 103 mg/dL — ABNORMAL HIGH (ref 70–99)
Phosphorus: 4.9 mg/dL — ABNORMAL HIGH (ref 2.5–4.6)
Potassium: 4 mmol/L (ref 3.5–5.1)
Sodium: 146 mmol/L — ABNORMAL HIGH (ref 135–145)

## 2019-09-23 LAB — SYNOVIAL CELL COUNT + DIFF, W/ CRYSTALS
Monocyte-Macrophage-Synovial Fluid: 23 % — ABNORMAL LOW (ref 50–90)
Neutrophil, Synovial: 77 % — ABNORMAL HIGH (ref 0–25)
WBC, Synovial: 32240 /mm3 — ABNORMAL HIGH (ref 0–200)

## 2019-09-23 LAB — MAGNESIUM: Magnesium: 1.7 mg/dL (ref 1.7–2.4)

## 2019-09-23 MED ORDER — LIDOCAINE HCL (PF) 1 % IJ SOLN
15.0000 mL | Freq: Once | INTRAMUSCULAR | Status: DC
  Filled 2019-09-23: qty 15

## 2019-09-23 MED ORDER — ISOSORB DINITRATE-HYDRALAZINE 20-37.5 MG PO TABS
1.0000 | ORAL_TABLET | Freq: Two times a day (BID) | ORAL | Status: DC
  Administered 2019-09-23 – 2019-09-28 (×12): 1 via ORAL
  Filled 2019-09-23 (×13): qty 1

## 2019-09-23 MED ORDER — TRIAMCINOLONE ACETONIDE 40 MG/ML IJ SUSP
60.0000 mg | Freq: Once | INTRAMUSCULAR | Status: AC
  Administered 2019-09-24: 60 mg via INTRA_ARTICULAR
  Filled 2019-09-23: qty 1.5

## 2019-09-23 MED ORDER — ACETAMINOPHEN 500 MG PO TABS
500.0000 mg | ORAL_TABLET | Freq: Two times a day (BID) | ORAL | Status: DC
  Administered 2019-09-23 – 2019-10-03 (×20): 500 mg via ORAL
  Filled 2019-09-23 (×21): qty 1

## 2019-09-23 MED ORDER — LIDOCAINE HCL 1 % IJ SOLN
5.0000 mL | Freq: Once | INTRAMUSCULAR | Status: DC
  Filled 2019-09-23: qty 5

## 2019-09-23 NOTE — Progress Notes (Signed)
Admit: 09/19/2019 LOS: 3  21M presenting with anasarca, new BiV s/d acute CHF, CKD vs AKI  Subjective:  . 5.6 L UOP yesterday, weight down 10 kg from admission . Creatinine stable to improved at 1.66, K4.0 . Patient was able to connect with dietitian yesterday . Started on isosorbide/hydralazine yesterday, remains on 40 mg IV 3 times daily Lasix . Tachycardia somewhat improved, resting heart rate in the 90s, blood pressures are stable . Patient feels better  03/27 0701 - 03/28 0700 In: 783 [P.O.:780; I.V.:3] Out: 5600 [Urine:5600]  Filed Weights   09/21/19 1248 09/22/19 1417 09/23/19 0555  Weight: (!) 170.4 kg (!) 164 kg (!) 160.2 kg    Scheduled Meds: . carvedilol  3.125 mg Oral BID WC  . Chlorhexidine Gluconate Cloth  6 each Topical Daily  . furosemide  40 mg Intravenous TID  . heparin  5,000 Units Subcutaneous Q8H  . isosorbide-hydrALAZINE  1 tablet Oral TID  . lidocaine  1 patch Transdermal Q24H  . sodium chloride flush  3 mL Intravenous Q12H   Continuous Infusions: . sodium chloride     PRN Meds:.sodium chloride, acetaminophen, sodium chloride flush  Current Labs: reviewed    Physical Exam:  Blood pressure 137/73, pulse 93, temperature 99 F (37.2 C), resp. rate 20, height 6\' 1"  (1.854 m), weight (!) 160.2 kg, SpO2 100 %. Morbidly obese, in bed, nl work of breathing  Diminished in the bases Tachycardic, regular, normal S1 and S2 3-4+ edema in the legs, pitting edema in the abdomen up towards the epigastric region Nonfocal, CN II through XII intact No rashes or lesions  A 1. Suspected CKD 3, less likely as AKI; negative renal imaging for obstruction or masses at admission; admit UA without hematuria, pyuria; 1+ proteinuria present 2. Acute systolic/diastolic biventricular CHF exacerbation with hypervolemia/anasarca/ascites 3. 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 Daily NSAID use prior to admission;  recently stopped for pain in knees  P . Cont 40 IV TID lasix; effective thus far . Check magnesium level with aggressive diuresis . Continue fluid and sodium restriction . Daily weights, Daily Renal Panel, Strict I/Os, Avoid nephrotoxins (NSAIDs, judicious IV Contrast)    MD 09/23/2019, 7:23 AM  Recent Labs  Lab 09/21/19 0529 09/22/19 0556 09/23/19 0514  NA 142 145 146*  K 4.7 4.2 4.0  CL 103 97* 95*  CO2 30 33* 39*  GLUCOSE 99 75 103*  BUN 27* 24* 24*  CREATININE 2.10* 1.81* 1.66*  CALCIUM 8.6* 8.6* 8.4*  PHOS 5.2* 4.5 4.9*   Recent Labs  Lab 09/19/19 2100 09/22/19 0556  WBC 5.1 6.6  HGB 16.0 15.2  HCT 51.2 49.1  MCV 91.8 91.4  PLT 288 228

## 2019-09-23 NOTE — Consult Note (Signed)
ORTHOPAEDIC CONSULTATION  REQUESTING PHYSICIAN: Jack Shiley, MD  Chief Complaint: Right knee pain and effusion  HPI: Jack Chavez is a 64 y.o. male who is admitted to the medical service for acute exacerbation of CHF among other comorbidities who in the last couple days has developed worsening right knee pain and swelling.  He states that this has happened once before to him and he thinks that it may have been drained.  He has had cortisone before.  He denies a history of gout.  Denies any fevers or chills or any constitutional symptoms.  Orthopedics consulted for evaluation right knee pain and effusion.  X-rays obtained demonstrate severe tricompartmental DJD.  Denies any injuries.  He is having trouble mobilizing with physical therapy due to the right knee pain.  Past Medical History:  Diagnosis Date  . HTN (hypertension)   . Morbid obesity ([place])    Past Surgical History:  Procedure Laterality Date  . KNEE SURGERY    . SHOULDER SURGERY     Social History   Socioeconomic History  . Marital status: Single    Spouse name: Not on file  . Number of children: Not on file  . Years of education: Not on file  . Highest education level: Not on file  Occupational History  . Not on file  Tobacco Use  . Smoking status: Former Smoker    Types: Cigars  . Smokeless tobacco: Never Used  . Tobacco comment: Occasional cigars in the 1980s  Substance and Sexual Activity  . Alcohol use: Yes    Comment: occasional - just few drinks a month  . Drug use: Never  . Sexual activity: Not on file  Other Topics Concern  . Not on file  Social History Narrative  . Not on file   Social Determinants of Health   Financial Resource Strain:   . Difficulty of Paying Living Expenses:   Food Insecurity:   . Worried About Charity fundraiser in the Last Year:   . Arboriculturist in the Last Year:   Transportation Needs:   . Film/video editor (Medical):   Marland Kitchen Lack of Transportation  (Non-Medical):   Physical Activity:   . Days of Exercise per Week:   . Minutes of Exercise per Session:   Stress:   . Feeling of Stress :   Social Connections:   . Frequency of Communication with Friends and Family:   . Frequency of Social Gatherings with Friends and Family:   . Attends Religious Services:   . Active Member of Clubs or Organizations:   . Attends Archivist Meetings:   Marland Kitchen Marital Status:    Family History  Problem Relation Age of Onset  . Diabetes Father   . Diabetes Paternal Grandmother    - negative except otherwise stated in the family history section Allergies  Allergen Reactions  . Morphine And Related     Patient reports "stomach tightness" in the past related to morphine use and does not remember what other reactions it caused.   Prior to Admission medications   Medication Sig Start Date End Date Taking? Authorizing Provider  amLODipine (NORVASC) 10 MG tablet Take 10 mg by mouth daily.   Yes [provider]  co-enzyme Q-10 30 MG capsule Take 30 mg by mouth daily.   Yes [provider]  Cyanocobalamin (VITAMIN B12) 3000 MCG/ML LIQD Place 1 mL under the tongue daily.   Yes [provider]  Garlic 10 MG  CAPS Take 2 capsules by mouth in the morning and at bedtime.   Yes [provider]  hydrochlorothiazide (HYDRODIURIL) 12.5 MG tablet Take 12.5 mg by mouth daily.   Yes [provider]  Magnesium 500 MG TABS Take 3 tablets by mouth daily.   Yes [provider]  Probiotic Product (PROBIOTIC PO) Take 1 capsule by mouth daily.   Yes [provider]  TURMERIC PO Take 1 capsule by mouth every other day.   Yes [provider]  VITAMIN A PO Take 1 tablet by mouth every other day.   Yes [provider]   DG Knee 1-2 Views Right  Result Date: 09/22/2019 CLINICAL DATA:  Right knee pain and swelling EXAM: RIGHT KNEE - 1-2 VIEW COMPARISON:  None. FINDINGS: Frontal and lateral views  of the right knee demonstrate moderate joint effusion. There is 3 compartment osteoarthritis greatest in the lateral and patellofemoral compartments, moderate in severity. No fracture, subluxation, or dislocation. Diffuse subcutaneous edema. IMPRESSION: 1. Moderate 3 compartment osteoarthritis. 2. Moderate knee effusion. 3. Diffuse subcutaneous edema. Electronically Signed   By: Sharlet Salina M.D.   On: 09/22/2019 15:07   - pertinent xrays, CT, MRI studies were reviewed and independently interpreted  Positive ROS: All other systems have been reviewed and were otherwise negative with the exception of those mentioned in the HPI and as above.  Physical Exam: General: No acute distress Cardiovascular: No pedal edema Respiratory: No cyanosis, no use of accessory musculature GI: No organomegaly, abdomen is soft and non-tender Skin: No lesions in the area of chief complaint Neurologic: Sensation intact distally Psychiatric: Patient is at baseline mood and affect Lymphatic: No axillary or cervical lymphadenopathy  MUSCULOSKELETAL:  Right knee shows a large joint effusion.  Slight warmth.  Very limited range of motion secondary to pain.  No cellulitis.  Neurovascular intact distally.  Well-healed postsurgical scar on the lateral side of the knee.  Assessment: Right knee DJD, effusion  Plan: I aspirated the right knee under sterile conditions at the bedside and obtained approximately 100 cc of bright yellow fluid that is consistent with inflammatory fluid and gout.  The fluid was sent to the lab for analysis.  Cortisone was injected as well.  Compression wrap applied.  Patient instructed to remain Jackp.o. until we have the results of the fluid.  Thank you for the consult and the opportunity to see Jack Chavez  N. Glee Arvin, MD [facility] 5:23 PM

## 2019-09-23 NOTE — Progress Notes (Signed)
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  He continues to diurese.  He has diuresed 8.2 L so far during this admission.  Patient has a history of hypertension.  he has been noncompliant with his medications.    we added bidil to his med list yesterday  BP is much better.  Creatinine continues to improve and is 1.66 today. His bp dropped yesterday and he did not get all 3 doses of bidil .   Inpatient Medications    Scheduled Meds: . carvedilol  3.125 mg Oral BID WC  . Chlorhexidine Gluconate Cloth  6 each Topical Daily  . furosemide  40 mg Intravenous TID  . heparin  5,000 Units Subcutaneous Q8H  . isosorbide-hydrALAZINE  1 tablet Oral TID  . lidocaine  1 patch Transdermal Q24H  . sodium chloride flush  3 mL Intravenous Q12H   Continuous Infusions: . sodium chloride     PRN Meds: sodium chloride, acetaminophen, sodium chloride flush   Vital Signs    Vitals:   09/22/19 2315 09/23/19 0518 09/23/19 0555 09/23/19 0819  BP: 100/60 137/73  139/79  Pulse: 94 93  97  Resp: 20 20    Temp: 99.5 F (37.5 C) 99 F (37.2 C)    TempSrc: Oral     SpO2: 97% 100%  96%  Weight:   (!) 160.2 kg   Height:        Intake/Output Summary (Last 24 hours) at 09/23/2019 0901 Last data filed at 09/23/2019 0500 Gross per 24 hour  Intake 663 ml  Output 5300 ml  Net -4637 ml   Last 3 Weights 09/23/2019 09/22/2019 09/21/2019  Weight (lbs) 353 lb 2.8 oz 361 lb 8.9 oz 375 lb 10.6 oz  Weight (kg) 160.2 kg 164 kg 170.4 kg      Telemetry    Sinus rhythm at 96.   ECG      - Personally Reviewed  Physical Exam   Physical Exam: Blood pressure 139/79, pulse 97, temperature 99 F (37.2 C), resp. rate 20, height 6\' 1"  (1.854 m), weight (!) 160.2 kg,  SpO2 96 %.  GEN:  Middle age male,  Obese,  HEENT: Normal NECK: JVD diffiuclt to assess due to thick neck . ; No carotid bruits LYMPHATICS: No lymphadenopathy CARDIAC: RRR   RESPIRATORY:  Clear to auscultation without rales, wheezing or rhonchi  ABDOMEN: Soft, non-tender, non-distended,  Ascites,  Pitting edema in the abdominal wall .  MUSCULOSKELETAL:      2+ edema bilaterally all the way up both legs and into his abdomen. SKIN: Warm and dry NEUROLOGIC:  Alert and oriented x 3 t   Labs    High Sensitivity Troponin:  No results for input(s): TROPONINIHS in the last 720 hours.    Chemistry Recent Labs  Lab 09/19/19 2100 09/20/19 0317 09/21/19 0529 09/22/19 0556 09/23/19 0514  NA 139   < > 142 145 146*  K 5.4*   < > 4.7 4.2 4.0  CL 102   < > 103 97* 95*  CO2 27   < > 30 33* 39*  GLUCOSE 79   < > 99 75 103*  BUN 26*   < > 27* 24* 24*  CREATININE 2.07*   < > 2.10* 1.81* 1.66*  CALCIUM 8.7*   < >  8.6* 8.6* 8.4*  PROT 7.3  --   --   --   --   ALBUMIN 3.7   < > 3.4* 3.3* 3.1*  AST 44*  --   --   --   --   ALT 21  --   --   --   --   ALKPHOS 66  --   --   --   --   BILITOT 2.1*  --   --   --   --   GFRNONAA 34*   < > 33* 39* 44*  GFRAA 39*   < > 38* 46* 51*  ANIONGAP 10   < > 9 15 12    < > = values in this interval not displayed.     Hematology Recent Labs  Lab 09/19/19 2100 09/22/19 0556  WBC 5.1 6.6  RBC 5.58 5.37  HGB 16.0 15.2  HCT 51.2 49.1  MCV 91.8 91.4  MCH 28.7 28.3  MCHC 31.3 31.0  RDW 16.6* 16.3*  PLT 288 228    BNP Recent Labs  Lab 09/19/19 2100  BNP 1,446.6*     DDimer No results for input(s): DDIMER in the last 168 hours.   Radiology    DG Knee 1-2 Views Right  Result Date: 09/22/2019 CLINICAL DATA:  Right knee pain and swelling EXAM: RIGHT KNEE - 1-2 VIEW COMPARISON:  None. FINDINGS: Frontal and lateral views of the right knee demonstrate moderate joint effusion. There is 3 compartment osteoarthritis greatest in the lateral and  patellofemoral compartments, moderate in severity. No fracture, subluxation, or dislocation. Diffuse subcutaneous edema. IMPRESSION: 1. Moderate 3 compartment osteoarthritis. 2. Moderate knee effusion. 3. Diffuse subcutaneous edema. Electronically Signed   By: Randa Ngo M.D.   On: 09/22/2019 15:07    Cardiac Studies     Patient Profile     64 y.o. male with acute on chronic systolic and diastolic congestive heart failure, acute on  chronic kidney disease  Assessment & Plan    1.  Acute biventricular CHF with severe pulmonary hypertension:   Feeling better .  Cont diuresis.  Cont TID lasix as long as he does not have profound cramping or hyperkalemia.  I would lower to twice daily dosing if those occur.  Continue BiDil twice a day.  Anticipate doing a heart catheterization later this week toward the middle end of the week.  We will need to diurese him adequately before the heart catheterization.   2.  Renal Insufficiency: Creatinine continues to improve.   For questions or updates, please contact [place] Please consult [email or web address] for contact info under        Signed, Mertie Moores, MD  09/23/2019, 9:01 AM

## 2019-09-23 NOTE — Progress Notes (Signed)
PROGRESS NOTE    Jack Chavez  SWF:[id number] DOB: 1955/08/25 DOA: 09/19/2019 PCP: Patient, No Pcp Per   Brief Narrative: 64 year old with past medical history significant for hypertension morbid obesity who presents complaining of worsening shortness of breath on exertion and peripheral edema.  He reports 10 days history of shortness of breath on exertion.  He also reports abdominal distention.  He ran of his hydrochlorothiazide for a month.  He reports diarrhea.  He went to an urgent care center and was advised to come to the ED due to abnormal kidney function.   Assessment & Plan:   Principal Problem:   Acute exacerbation of CHF (congestive heart failure) (HCC) Active Problems:   Renal insufficiency   Hypokalemia   Serum total bilirubin elevated   Hypertension   Acute systolic CHF (congestive heart failure), NYHA class 4 (HCC)  1-Acute systolic and diastolic heart failure exacerbation Continue with IV Lasix 40 mg IV TID. Echocardiogram showed ejection fraction 20 to 25% diastolic dysfunction, generalized hypokinesis.  Cardiology consulted. Patient will required right and left Hearth cath.  Daily weight. 375---353 Negative 8 L.  Cardiology started hydralazine/Imdur.    2-CKD stage 3, pre nephrology , less likely AKI Continue with lasix. Increase to 40 mg TID Nephrology consulted.  Cr stable.  Place foley, dermitis  scrotum skin due to incontinence of urine.  Renal function stable.   3-Acute hypoxic respiratory failure, requiring oxygen supplementation.  Oxygen decreased to 70 on Room Air, improved on 3 L.  In setting heart failure.  Continue with IV lasix   Diarrhea; Report 3 episode watery stool daily.  GI pathogen ordered.  No further diarrhea in the hospital.  Mild elevation Bili;  [place]; Very nodular hepatic contour suggesting cirrhosis. Moderate ascites. Ascites; might need paracentesis.   HTN; Continue with lasix.   Knee pain; knee effusion. Will consult ortho  for evaluation of Arthrocentesis.    Hyperkalemia; received lokelma   Estimated body mass index is 46.6 kg/m as calculated from the following:   Height as of this encounter: 6\' 1"  (1.854 m).   Weight as of this encounter: 160.2 kg.   DVT prophylaxis:  Heparin  Code Status: full code Family Communication: care discussed with patient.  Disposition Plan:  Patient is from: home  Anticipated d/c date: in 2--3 days  Barriers to d/c or necessity for inpatient status: remain in the hospital for management of acute Systolic HF, requiring IV lasix.   Consultants:   Cardiology  Nephrology   Procedures:   ECHO Systolic and diastolic HF, Ef   Antimicrobials:    Subjective: Alert, breathing better.  Complaint of knee pain   Objective: Vitals:   09/23/19 0518 09/23/19 0555 09/23/19 0819 09/23/19 0959  BP: 137/73  139/79 (!) 146/95  Pulse: 93  97 (!) 110  Resp: 20     Temp: 99 F (37.2 C)     TempSrc:      SpO2: 100%  96% 97%  Weight:  (!) 160.2 kg    Height:        Intake/Output Summary (Last 24 hours) at 09/23/2019 1302 Last data filed at 09/23/2019 1140 Gross per 24 hour  Intake 1760 ml  Output 5300 ml  Net -3540 ml   Filed Weights   09/21/19 1248 09/22/19 1417 09/23/19 0555  Weight: (!) 170.4 kg (!) 164 kg (!) 160.2 kg    Examination:  General exam: NAD Respiratory system: B/L crackles Cardiovascular system: S 1, S 2 RRR Gastrointestinal system:  BS present, soft. nt Central nervous system: Non focal.  Extremities: Plus 2 edema    Data Reviewed: I have personally reviewed following labs and imaging studies  CBC: Recent Labs  Lab 09/19/19 2100 09/22/19 0556  WBC 5.1 6.6  HGB 16.0 15.2  HCT 51.2 49.1  MCV 91.8 91.4  PLT 288 228   Basic Metabolic Panel: Recent Labs  Lab 09/20/19 0317 09/20/19 1453 09/21/19 0529 09/22/19 0556 09/23/19 0514  NA 140 142 142 145 146*  K 5.2* 4.5 4.7 4.2 4.0  CL 99 102 103 97* 95*  CO2 28 32 30 33* 39*   GLUCOSE 72 101* 99 75 103*  BUN 27* 28* 27* 24* 24*  CREATININE 2.01* 1.95* 2.10* 1.81* 1.66*  CALCIUM 8.5* 8.5* 8.6* 8.6* 8.4*  MG  --   --   --   --  1.7  PHOS  --  4.5 5.2* 4.5 4.9*   GFR: Estimated Creatinine Clearance: 74 mL/min (A) (by C-G formula based on SCr of 1.66 mg/dL (H)). Liver Function Tests: Recent Labs  Lab 09/19/19 2100 09/20/19 1453 09/21/19 0529 09/22/19 0556 09/23/19 0514  AST 44*  --   --   --   --   ALT 21  --   --   --   --   ALKPHOS 66  --   --   --   --   BILITOT 2.1*  --   --   --   --   PROT 7.3  --   --   --   --   ALBUMIN 3.7 3.2* 3.4* 3.3* 3.1*   Recent Labs  Lab 09/19/19 2100  LIPASE 36   No results for input(s): AMMONIA in the last 168 hours. Coagulation Profile: Recent Labs  Lab 09/22/19 0556  INR 1.5*   Cardiac Enzymes: No results for input(s): CKTOTAL, CKMB, CKMBINDEX, TROPONINI in the last 168 hours. BNP (last 3 results) No results for input(s): PROBNP in the last 8760 hours. HbA1C: No results for input(s): HGBA1C in the last 72 hours. CBG: No results for input(s): GLUCAP in the last 168 hours. Lipid Profile: Recent Labs    09/22/19 0556  CHOL 150  HDL 33*  LDLCALC 101*  TRIG 81  CHOLHDL 4.5   Thyroid Function Tests: Recent Labs    09/21/19 0857  TSH 0.710   Anemia Panel: No results for input(s): VITAMINB12, FOLATE, FERRITIN, TIBC, IRON, RETICCTPCT in the last 72 hours. Sepsis Labs: Recent Labs  Lab 09/20/19 0317  PROCALCITON <0.10    Recent Results (from the past 240 hour(s))  SARS CORONAVIRUS 2 (TAT 6-24 HRS) Nasopharyngeal Nasopharyngeal Swab     Status: None   Collection Time: 09/19/19 11:38 PM   Specimen: Nasopharyngeal Swab  Result Value Ref Range Status   SARS Coronavirus 2 NEGATIVE NEGATIVE Final    Comment: (NOTE) SARS-CoV-2 target nucleic acids are NOT DETECTED. The SARS-CoV-2 RNA is generally detectable in upper and lower respiratory specimens during the acute phase of infection. Negative  results do not preclude SARS-CoV-2 infection, do not rule out co-infections with other pathogens, and should not be used as the sole basis for treatment or other patient management decisions. Negative results must be combined with clinical observations, patient history, and epidemiological information. The expected result is Negative. Fact Sheet for Patients: HairSlick.no Fact Sheet for Healthcare Providers: quierodirigir.com This test is not yet approved or cleared by the [place] FDA and  has been authorized for detection and/or diagnosis of SARS-CoV-2 by  FDA under an Emergency Use Authorization (EUA). This EUA will remain  in effect (meaning this test can be used) for the duration of the COVID-19 declaration under Section 56 4(b)(1) of the Act, 21 U.S.C. section 360bbb-3(b)(1), unless the authorization is terminated or revoked sooner. Performed at [facility], [place] [address] [place],  [address]          Radiology Studies: DG Knee 1-2 Views Right  Result Date: 09/22/2019 CLINICAL DATA:  Right knee pain and swelling EXAM: RIGHT KNEE - 1-2 VIEW COMPARISON:  None. FINDINGS: Frontal and lateral views of the right knee demonstrate moderate joint effusion. There is 3 compartment osteoarthritis greatest in the lateral and patellofemoral compartments, moderate in severity. No fracture, subluxation, or dislocation. Diffuse subcutaneous edema. IMPRESSION: 1. Moderate 3 compartment osteoarthritis. 2. Moderate knee effusion. 3. Diffuse subcutaneous edema. Electronically Signed   By: Randa Ngo M.D.   On: 09/22/2019 15:07        Scheduled Meds: . acetaminophen  500 mg Oral BID  . carvedilol  3.125 mg Oral BID WC  . Chlorhexidine Gluconate Cloth  6 each Topical Daily  . furosemide  40 mg Intravenous TID  . heparin  5,000 Units Subcutaneous Q8H  . isosorbide-hydrALAZINE  1 tablet Oral BID  . lidocaine  1 patch  Transdermal Q24H  . sodium chloride flush  3 mL Intravenous Q12H   Continuous Infusions: . sodium chloride       LOS: 3 days    Time spent: 35 minutes    Delois Silvester A Ivie Savitt, MD [facility]   If 7PM-7AM, please contact night-coverage [email or web address]  09/23/2019, 1:02 PM

## 2019-09-23 NOTE — Progress Notes (Addendum)
Knee aspiration fluid is consistent with gout.  Intraarticular corticosteroid has already been injected in the knee.  I have removed patient's NPO order.  Symptomatic treatment by primary team.  Mobilize with PT.  No orthopedic follow up needed.    Mayra Reel, MD [facility] [phone] 8:53 PM

## 2019-09-23 NOTE — Progress Notes (Signed)
Rehab Admissions Coordinator Note:  Per OT recommendation, patient was screened by Stephania Fragmin for appropriateness for an Inpatient Acute Rehab Consult.  At this time, we are recommending Inpatient Rehab consult.  I will place an order per our protocol.   Stephania Fragmin 09/23/2019, 11:13 AM  I can be reached at [phone].

## 2019-09-23 NOTE — Evaluation (Addendum)
Physical Therapy Evaluation Patient Details Name: Jack Chavez MRN: [id number] DOB: 1955-09-05 Today's Date: 09/23/2019   History of Present Illness  64 year old with past medical history significant for hypertension morbid obesity who presents complaining of worsening shortness of breath on exertion and peripheral edema. Acute dx of Acute exacerbation of CHF and R knee pain  Clinical Impression  On eval, pt required Mod assist +2 for mobility He was able to stand for ~20 seconds at bedside with use of a RW for support (after much encouragement and time). He was unable to attempt any steps on today. Mobility is limited by moderate-severe pain R knee with all mobility tasks. Will continue to follow and progress activity as tolerated. Could possibly benefit from an ortho consult if MD agrees. Recommend CIR consult (SNF if CIR not an option)     Follow Up Recommendations CIR    Equipment Recommendations  None recommended by PT    Recommendations for Other Services OT consult     Precautions / Restrictions Precautions Precautions: Fall Precaution Comments: monitor O2 stats Restrictions Weight Bearing Restrictions: No      Mobility  Bed Mobility Overal bed mobility: Needs Assistance Bed Mobility: Supine to Sit    Supine to sit: Mod assist;+2 for physical assistance;+2 for safety/equipment;HOB elevated Sit to Supine: Mod assist;+2 for physical assistance; +2 for safety/equipment   General bed mobility comments: Assist for trunk and bil LEs. Utilized bedpad to aid with scooting, positioning. Increased time. Cues for technique. Pt relied heavily on bedrails.  Transfers Overall transfer level: Needs assistance Equipment used: Rolling walker (2 wheeled) Transfers: Sit to/from Stand Sit to Stand: Min assist;+2 physical assistance;+2 safety/equipment;From elevated surface         General transfer comment: Highly elevated bed! After a significant amount of time, pt was able to stand  for ~20 seconds. He was unable to take any steps. Flexed posture and wide BOS.  Ambulation/Gait             General Gait Details: NT-pt unable  Stairs            Wheelchair Mobility    Modified Rankin (Stroke Patients Only)       Balance Overall balance assessment: Needs assistance         Standing balance support: Bilateral upper extremity supported Standing balance-Leahy Scale: Poor                               Pertinent Vitals/Pain Pain Assessment: 0-10 Pain Score: 7  Pain Location: R knee Pain Descriptors / Indicators: Discomfort;Grimacing;Guarding;Tender;Sharp Pain Intervention(s): Limited activity within patient's tolerance;Repositioned;Ice applied(ice to area of knee not covered by pain patch)    Home Living Family/patient expects to be discharged to:: Private residence Living Arrangements: Other relatives Available Help at Discharge: Other (Comment) Type of Home: House Home Access: Stairs to enter Entrance Stairs-Rails: Right Entrance Stairs-Number of Steps: 9 Home Layout: One level Home Equipment: Cane - single point;Walker - 2 wheels      Prior Function Level of Independence: Independent with assistive device(s)         Comments: SPC to mobilize      Hand Dominance   Dominant Hand: Right    Extremity/Trunk Assessment   Upper Extremity Assessment Upper Extremity Assessment: Generalized weakness    Lower Extremity Assessment Lower Extremity Assessment: RLE deficits/detail RLE Deficits / Details: able to DF/PF. Knee flex ~55 degrees (sitting EOB; pt unable to tolerate  PROM in supine). Hip flex 3-/5 (limited by pain). R knee very edematous and warm to touch. RLE: Unable to fully assess due to pain    Cervical / Trunk Assessment Cervical / Trunk Assessment: Normal  Communication   Communication: No difficulties  Cognition Arousal/Alertness: Awake/alert Behavior During Therapy: WFL for tasks  assessed/performed Overall Cognitive Status: Within Functional Limits for tasks assessed                                        General Comments      Exercises     Assessment/Plan    PT Assessment Patient needs continued PT services  PT Problem List         PT Treatment Interventions DME instruction;Gait training;Therapeutic activities;Therapeutic exercise;Patient/family education;Balance training;Functional mobility training    PT Goals (Current goals can be found in the Care Plan section)  Acute Rehab PT Goals Patient Stated Goal: to get my strength back. less pain PT Goal Formulation: With patient Time For Goal Achievement: 10/07/19 Potential to Achieve Goals: Good    Frequency Min 3X/week   Barriers to discharge        Co-evaluation               AM-PAC PT "6 Clicks" Mobility  Outcome Measure Help needed turning from your back to your side while in a flat bed without using bedrails?: A Lot Help needed moving from lying on your back to sitting on the side of a flat bed without using bedrails?: A Lot Help needed moving to and from a bed to a chair (including a wheelchair)?: A Lot Help needed standing up from a chair using your arms (e.g., wheelchair or bedside chair)?: A Lot Help needed to walk in hospital room?: Total Help needed climbing 3-5 steps with a railing? : Total 6 Click Score: 10    End of Session Equipment Utilized During Treatment: Gait belt;Oxygen Activity Tolerance: Patient limited by fatigue;Patient limited by pain Patient left: in bed;with call bell/phone within reach;with bed alarm set   PT Visit Diagnosis: Muscle weakness (generalized) (M62.81);Pain;Other abnormalities of gait and mobility (R26.89) Pain - Right/Left: Right Pain - part of body: Knee    Time: 7628-3151 PT Time Calculation (min) (ACUTE ONLY): 36 min   Charges:   PT Evaluation $PT Eval Moderate Complexity: 1 Mod PT Treatments $Therapeutic Activity:  8-22 mins           Doreatha Massed, PT Acute Rehabilitation

## 2019-09-24 DIAGNOSIS — I5043 Acute on chronic combined systolic (congestive) and diastolic (congestive) heart failure: Secondary | ICD-10-CM

## 2019-09-24 LAB — CBC
HCT: 43.6 % (ref 39.0–52.0)
Hemoglobin: 13.8 g/dL (ref 13.0–17.0)
MCH: 29.1 pg (ref 26.0–34.0)
MCHC: 31.7 g/dL (ref 30.0–36.0)
MCV: 92 fL (ref 80.0–100.0)
Platelets: 243 10*3/uL (ref 150–400)
RBC: 4.74 MIL/uL (ref 4.22–5.81)
RDW: 15.7 % — ABNORMAL HIGH (ref 11.5–15.5)
WBC: 5.3 10*3/uL (ref 4.0–10.5)
nRBC: 0 % (ref 0.0–0.2)

## 2019-09-24 LAB — RENAL FUNCTION PANEL
Albumin: 2.9 g/dL — ABNORMAL LOW (ref 3.5–5.0)
Anion gap: 11 (ref 5–15)
BUN: 24 mg/dL — ABNORMAL HIGH (ref 8–23)
CO2: 39 mmol/L — ABNORMAL HIGH (ref 22–32)
Calcium: 8.8 mg/dL — ABNORMAL LOW (ref 8.9–10.3)
Chloride: 91 mmol/L — ABNORMAL LOW (ref 98–111)
Creatinine, Ser: 1.49 mg/dL — ABNORMAL HIGH (ref 0.61–1.24)
GFR calc Af Amer: 58 mL/min — ABNORMAL LOW (ref >60)
GFR calc non Af Amer: 50 mL/min — ABNORMAL LOW (ref >60)
Glucose, Bld: 117 mg/dL — ABNORMAL HIGH (ref 70–99)
Phosphorus: 3.8 mg/dL (ref 2.5–4.6)
Potassium: 4.4 mmol/L (ref 3.5–5.1)
Sodium: 141 mmol/L (ref 135–145)

## 2019-09-24 LAB — HEPATIC FUNCTION PANEL
ALT: 12 U/L (ref 0–44)
AST: 19 U/L (ref 15–41)
Albumin: 3.1 g/dL — ABNORMAL LOW (ref 3.5–5.0)
Alkaline Phosphatase: 49 U/L (ref 38–126)
Bilirubin, Direct: 0.2 mg/dL (ref 0.0–0.2)
Indirect Bilirubin: 0.9 mg/dL (ref 0.3–0.9)
Total Bilirubin: 1.1 mg/dL (ref 0.3–1.2)
Total Protein: 6.8 g/dL (ref 6.5–8.1)

## 2019-09-24 LAB — URIC ACID: Uric Acid, Serum: 11.9 mg/dL — ABNORMAL HIGH (ref 3.7–8.6)

## 2019-09-24 MED ORDER — METOPROLOL SUCCINATE ER 25 MG PO TB24
25.0000 mg | ORAL_TABLET | Freq: Every day | ORAL | Status: DC
  Administered 2019-09-24 – 2019-09-28 (×5): 25 mg via ORAL
  Filled 2019-09-24 (×6): qty 1

## 2019-09-24 MED ORDER — ALLOPURINOL 100 MG PO TABS
100.0000 mg | ORAL_TABLET | Freq: Every day | ORAL | Status: DC
  Administered 2019-09-24 – 2019-10-03 (×9): 100 mg via ORAL
  Filled 2019-09-24 (×9): qty 1

## 2019-09-24 MED ORDER — COLCHICINE 0.3 MG HALF TABLET
0.3000 mg | ORAL_TABLET | Freq: Every day | ORAL | Status: DC
  Filled 2019-09-24: qty 1

## 2019-09-24 MED ORDER — ACETAZOLAMIDE 250 MG PO TABS
500.0000 mg | ORAL_TABLET | Freq: Once | ORAL | Status: AC
  Administered 2019-09-24: 500 mg via ORAL
  Filled 2019-09-24: qty 2

## 2019-09-24 NOTE — Progress Notes (Signed)
Admit: 09/19/2019 LOS: 4  66M presenting with anasarca, new BiV s/d acute CHF, CKD vs AKI  Subjective:  3.9 liters UOP over 3/28.  Has been on lasix 40 mg IV 3 times daily.  Still on supplemental oxygen.  States foley catheter was placed after retention.  Review of systems States his shortness of breath is better.  Denies cp Denies n/v  03/28 0701 - 03/29 0700 In: 1758 [P.O.:958] Out: 3851 [Urine:3850; Stool:1]  Filed Weights   09/22/19 1417 09/23/19 0555 09/24/19 0500  Weight: (!) 164 kg (!) 160.2 kg (!) 157.5 kg    Scheduled Meds: . acetaminophen  500 mg Oral BID  . allopurinol  100 mg Oral Daily  . Chlorhexidine Gluconate Cloth  6 each Topical Daily  . colchicine  0.3 mg Oral Daily  . furosemide  40 mg Intravenous TID  . heparin  5,000 Units Subcutaneous Q8H  . isosorbide-hydrALAZINE  1 tablet Oral BID  . lidocaine  1 patch Transdermal Q24H  . lidocaine (PF)  15 mL Intradermal Once  . metoprolol succinate  25 mg Oral Daily  . sodium chloride flush  3 mL Intravenous Q12H  . triamcinolone acetonide  60 mg Intra-articular Once   Continuous Infusions: . sodium chloride     PRN Meds:.sodium chloride, acetaminophen, sodium chloride flush  Current Labs: reviewed    Physical Exam:  Blood pressure (!) 152/83, pulse (!) 103, temperature 98.2 F (36.8 C), temperature source Oral, resp. rate 18, height 6\' 1"  (1.854 m), weight (!) 157.5 kg, SpO2 94 %. Morbidly obese, in bed, Diminished in the bases unlabored Heart S1S2 no rub 2-3+ edema in the legs, pitting edema in the abdomen up towards the epigastric region Neuro alert and oriented x 3; provides hx and follows commands psych normal mood and affect  GU has a foley with urine in place   1. AKI improving.  May have some degree of CKD.  negative renal imaging for obstruction or masses at admission; admit UA without hematuria, pyuria; 1+ proteinuria present.   - diuretics as below - Would discontinue colchicine with renal  impairment - spoke with primary team and they are ok with holding.  Ok for one-time only colchicine if needed with reassessement 2. Acute systolic/diastolic biventricular CHF exacerbation with hypervolemia/anasarca/ascites.  S/p lasix earlier this AM.  Diamox once later this afternoon.  Reassess lasix tomorrow.  Continue fluid and sodium restriction 3. Urinary retention - per pt report.  Cannot find straight cath or post void documented.  - foley catheter in place.  Defer to primary team.  4. 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 Daily NSAID use prior to admission; recently stopped for pain in knees - would not resume on discharge please   Recent Labs  Lab 09/22/19 0556 09/23/19 0514 09/24/19 0504  NA 145 146* 141  K 4.2 4.0 4.4  CL 97* 95* 91*  CO2 33* 39* 39*  GLUCOSE 75 103* 117*  BUN 24* 24* 24*  CREATININE 1.81* 1.66* 1.49*  CALCIUM 8.6* 8.4* 8.8*  PHOS 4.5 4.9* 3.8   Recent Labs  Lab 09/19/19 2100 09/22/19 0556 09/24/19 0504  WBC 5.1 6.6 5.3  HGB 16.0 15.2 13.8  HCT 51.2 49.1 43.6  MCV 91.8 91.4 92.0  PLT 288 228 243      09/26/19 09/24/2019 11:47 AM

## 2019-09-24 NOTE — TOC Progression Note (Signed)
Transition of Care [facility]) - Progression Note    Patient Details  Name: Jack Chavez MRN: [id number] Date of Birth: [birth year]  [facility] CM/SW Contact  Lilana Blasko, Olegario Messier, RN Phone Number: 09/24/2019, 11:28 AM  Clinical Narrative:  Noted CIR @ [facility] out of network-per patient permission faxed clinicals to [facility]-await response.     Expected Discharge Plan: IP Rehab Facility Barriers to Discharge: Continued Medical Work up  Expected Discharge Plan and Services Expected Discharge Plan: IP Rehab Facility   Discharge Planning Services: CM Consult   Living arrangements for the past 2 months: Single Family Home                                       Social Determinants of Health (SDOH) Interventions    Readmission Risk Interventions No flowsheet data found.

## 2019-09-24 NOTE — Progress Notes (Signed)
Occupational Therapy Treatment Patient Details Name: Jack Chavez MRN: [id number] DOB: 1955-12-25 Today's Date: 09/24/2019    History of present illness 64 year old with past medical history significant for hypertension morbid obesity who presents complaining of worsening shortness of breath on exertion and peripheral edema. Acute dx of Acute exacerbation of CHF and R knee pain   OT comments  Patient reports decreased pain in R knee today after aspiration yesterday. Patient able to mobilize R leg with min A to edge of bed and HOB significantly elevated with heavy use of bed rails to sit upright. Patient require increased time in sitting to shift weight evenly at edge of bed, and unilateral UE support in order to wash his face. Patient able to stand with min A and bed height significantly elevated with cues for body mechanics + use of rolling walker. Patient stood for approximately 2 minutes while OT perform peri care after incontinent of bowel movement, patient then able to side step to head of bed with min A. Pt request to stay sitting at edge of bed to eat breakfast, RN made aware.   Follow Up Recommendations  CIR    Equipment Recommendations  Tub/shower bench       Precautions / Restrictions Precautions Precautions: Fall Precaution Comments: monitor O2 stats Restrictions Weight Bearing Restrictions: No       Mobility Bed Mobility Overal bed mobility: Needs Assistance Bed Mobility: Supine to Sit     Supine to sit: Min assist;HOB elevated     General bed mobility comments: min A to manage R LE towards edge of bed, head of bed significantly elevated to assist with trunk support and heavy use of bed rails  Transfers Overall transfer level: Needs assistance Equipment used: Rolling walker (2 wheeled) Transfers: Sit to/from Stand Sit to Stand: Min assist;From elevated surface         General transfer comment: highly elevated bed! requires increased time seated EOB before  attempting to stand, cues for body mechanics    Balance Overall balance assessment: Needs assistance Sitting-balance support: Single extremity supported;Feet supported Sitting balance-Leahy Scale: Fair     Standing balance support: Bilateral upper extremity supported;During functional activity Standing balance-Leahy Scale: Poor Standing balance comment: reliant on B UE support                            ADL either performed or assessed with clinical judgement   ADL Overall ADL's : Needs assistance/impaired     Grooming: Set up;Wash/dry face;Sitting                       Toileting- Clothing Manipulation and Hygiene: Total assistance;Sit to/from stand Toileting - Clothing Manipulation Details (indicate cue type and reason): patient able to stand edge of bed for peri care after incontinent of stool in sitting       General ADL Comments: patient reports improved pain in R knee since aspiration yesterday, still requires assist and bed height significantly elevated in order to stand               Cognition Arousal/Alertness: Awake/alert Behavior During Therapy: [facility] for tasks assessed/performed Overall Cognitive Status: Within Functional Limits for tasks assessed  Pertinent Vitals/ Pain       Pain Assessment: Faces Faces Pain Scale: Hurts little more Pain Location: R knee Pain Descriptors / Indicators: Discomfort;Grimacing;Guarding Pain Intervention(s): Monitored during session         Frequency  Min 2X/week        Progress Toward Goals  OT Goals(current goals can now be found in the care plan section)  Progress towards OT goals: Progressing toward goals  Acute Rehab OT Goals Patient Stated Goal: to get my strength back. less pain OT Goal Formulation: With patient Time For Goal Achievement: 10/06/19 Potential to Achieve Goals: Fair ADL Goals Pt Will Perform Lower  Body Dressing: with max assist;sit to/from stand;with adaptive equipment Pt Will Transfer to Toilet: bedside commode;with max assist Pt Will Perform Toileting - Clothing Manipulation and hygiene: with max assist Pt/caregiver will Perform Home Exercise Program: With written HEP provided;Increased strength;Both right and left upper extremity  Plan Discharge plan remains appropriate       AM-PAC OT "6 Clicks" Daily Activity     Outcome Measure   Help from another person eating meals?: A Little Help from another person taking care of personal grooming?: A Little Help from another person toileting, which includes using toliet, bedpan, or urinal?: Total Help from another person bathing (including washing, rinsing, drying)?: A Lot Help from another person to put on and taking off regular upper body clothing?: A Little Help from another person to put on and taking off regular lower body clothing?: Total 6 Click Score: 13    End of Session Equipment Utilized During Treatment: Rolling walker  OT Visit Diagnosis: Unsteadiness on feet (R26.81);Other abnormalities of gait and mobility (R26.89);Pain Pain - Right/Left: Right Pain - part of body: Knee   Activity Tolerance Patient tolerated treatment well   Patient Left in bed;with call bell/phone within reach   Nurse Communication Mobility status        Time: 4163-8453 OT Time Calculation (min): 46 min  Charges: OT General Charges $OT Visit: 1 Visit OT Treatments $Self Care/Home Management : 38-52 mins  Myrtie Neither OT OT office: [phone]   Carmelia Roller 09/24/2019, 12:52 PM

## 2019-09-24 NOTE — TOC Progression Note (Signed)
Transition of Care [facility]) - Progression Note    Patient Details  Name: Jack Chavez MRN: [id number] Date of Birth: 06/17/1956  Transition of Care [facility]) CM/SW Contact  Rosanna Bickle, Olegario Messier, RN Phone Number: 09/24/2019, 4:00 PM  Clinical Narrative:  [facility] rep Velna Hatchet declined CIR-they recc SNF.     Expected Discharge Plan: IP Rehab Facility Barriers to Discharge: Continued Medical Work up  Expected Discharge Plan and Services Expected Discharge Plan: IP Rehab Facility   Discharge Planning Services: CM Consult   Living arrangements for the past 2 months: Single Family Home                                       Social Determinants of Health (SDOH) Interventions    Readmission Risk Interventions No flowsheet data found.

## 2019-09-24 NOTE — Progress Notes (Signed)
PROGRESS NOTE    Trace Wirick  KNL:[id number] DOB: 1956-02-23 DOA: 09/19/2019 PCP: Patient, No Pcp Per   Brief Narrative: 64 year old with past medical history significant for hypertension morbid obesity who presents complaining of worsening shortness of breath on exertion and peripheral edema.  He reports 10 days history of shortness of breath on exertion.  He also reports abdominal distention.  He ran of his hydrochlorothiazide for a month.  He reports diarrhea.  He went to an urgent care center and was advised to come to the ED due to abnormal kidney function.   Assessment & Plan:   Principal Problem:   Acute exacerbation of CHF (congestive heart failure) (HCC) Active Problems:   Renal insufficiency   Hypokalemia   Serum total bilirubin elevated   Hypertension   Acute systolic CHF (congestive heart failure), NYHA class 4 (HCC)   Effusion, right knee  1-Acute systolic and diastolic heart failure exacerbation Treated with  IV Lasix 40 mg IV TID.holding lasix today due to increase CO2.  Echocardiogram showed ejection fraction 20 to 79% diastolic dysfunction, generalized hypokinesis.  Cardiology consulted. Patient will required right and left [place] cath.  Daily weight. 375---353--347 Negative 9 L.  Cardiology started hydralazine/Imdur.    2-CKD stage 3, pre nephrology , less likely AKI Continue with lasix. Increase to 40 mg TID Nephrology consulted.  Cr stable.  Place foley, dermitis  scrotum skin due to incontinence of urine. Plan to remove foley 3-30. Renal function stable. Improved down to 1.4 Nephrology planning acetazolamide. Hold lasix today.   3-Acute hypoxic respiratory failure, requiring oxygen supplementation.  Oxygen decreased to 70 on Room Air, improved on 3 L.  In setting heart failure.  Treated with IV lasix   Diarrhea; Report 3 episode watery stool daily.  GI pathogen ordered.  No further diarrhea in the hospital.  Mild elevation Bili;  [place]; Very nodular  hepatic contour suggesting cirrhosis. Moderate ascites. Ascites; might need paracentesis.   HTN; Continue with lasix.   Knee pain; Gout;  knee effusion. Had Arthrocentesis done by Dr Erlinda Hong. Synovial fluid with 32,000 monosodium urate crystal.  Started  Allopurinol.  Hold on colchicine due to AKI  Hyperkalemia; received lokelma   Estimated body mass index is 45.81 kg/m as calculated from the following:   Height as of this encounter: 6\' 1"  (1.854 m).   Weight as of this encounter: 157.5 kg.   DVT prophylaxis:  Heparin  Code Status: full code Family Communication: care discussed with patient.  Disposition Plan:  Patient is from: home  Anticipated d/c date: in 2--3 days  Barriers to d/c or necessity for inpatient status: remain in the hospital for management of acute Systolic HF, requiring IV lasix.   Consultants:   Cardiology  Nephrology   Procedures:   ECHO Systolic and diastolic HF, Ef 02%   Antimicrobials:    Subjective: Patient reports improvement of shortness of breath. Right knee pain has improved since yesterday after he had the arthrocentesis. No further   Diarrhea.  Objective: Vitals:   09/23/19 1350 09/23/19 1738 09/23/19 2015 09/24/19 0500  BP: 125/67 126/65 (!) 143/91   Pulse: 97 96 94   Resp: 19  19   Temp: 98.4 F (36.9 C)  98.2 F (36.8 C)   TempSrc: Oral  Oral   SpO2: 97%  95%   Weight:    (!) 157.5 kg  Height:        Intake/Output Summary (Last 24 hours) at 09/24/2019 0827 Last data filed at 09/24/2019  0500 Gross per 24 hour  Intake 1758 ml  Output 3851 ml  Net -2093 ml   Filed Weights   09/22/19 1417 09/23/19 0555 09/24/19 0500  Weight: (!) 164 kg (!) 160.2 kg (!) 157.5 kg    Examination:  General exam: NAD Respiratory system: BL crackles.  Cardiovascular system: S 1, S 2 RRR Gastrointestinal system: BS present, soft ,nt Central nervous system: Non focal.  Extremities: plus 2 edema    Data Reviewed: I have personally  reviewed following labs and imaging studies  CBC: Recent Labs  Lab 09/19/19 2100 09/22/19 0556 09/24/19 0504  WBC 5.1 6.6 5.3  HGB 16.0 15.2 13.8  HCT 51.2 49.1 43.6  MCV 91.8 91.4 92.0  PLT 288 228 243   Basic Metabolic Panel: Recent Labs  Lab 09/20/19 1453 09/21/19 0529 09/22/19 0556 09/23/19 0514 09/24/19 0504  NA 142 142 145 146* 141  K 4.5 4.7 4.2 4.0 4.4  CL 102 103 97* 95* 91*  CO2 32 30 33* 39* 39*  GLUCOSE 101* 99 75 103* 117*  BUN 28* 27* 24* 24* 24*  CREATININE 1.95* 2.10* 1.81* 1.66* 1.49*  CALCIUM 8.5* 8.6* 8.6* 8.4* 8.8*  MG  --   --   --  1.7  --   PHOS 4.5 5.2* 4.5 4.9* 3.8   GFR: Estimated Creatinine Clearance: 81.7 mL/min (A) (by C-G formula based on SCr of 1.49 mg/dL (H)). Liver Function Tests: Recent Labs  Lab 09/19/19 2100 09/19/19 2100 09/20/19 1453 09/21/19 0529 09/22/19 0556 09/23/19 0514 09/24/19 0504  AST 44*  --   --   --   --   --   --   ALT 21  --   --   --   --   --   --   ALKPHOS 66  --   --   --   --   --   --   BILITOT 2.1*  --   --   --   --   --   --   PROT 7.3  --   --   --   --   --   --   ALBUMIN 3.7   < > 3.2* 3.4* 3.3* 3.1* 2.9*   < > = values in this interval not displayed.   Recent Labs  Lab 09/19/19 2100  LIPASE 36   No results for input(s): AMMONIA in the last 168 hours. Coagulation Profile: Recent Labs  Lab 09/22/19 0556  INR 1.5*   Cardiac Enzymes: No results for input(s): CKTOTAL, CKMB, CKMBINDEX, TROPONINI in the last 168 hours. BNP (last 3 results) No results for input(s): PROBNP in the last 8760 hours. HbA1C: No results for input(s): HGBA1C in the last 72 hours. CBG: No results for input(s): GLUCAP in the last 168 hours. Lipid Profile: Recent Labs    09/22/19 0556  CHOL 150  HDL 33*  LDLCALC 101*  TRIG 81  CHOLHDL 4.5   Thyroid Function Tests: Recent Labs    09/21/19 0857  TSH 0.710   Anemia Panel: No results for input(s): VITAMINB12, FOLATE, FERRITIN, TIBC, IRON, RETICCTPCT in  the last 72 hours. Sepsis Labs: Recent Labs  Lab 09/20/19 0317  PROCALCITON <0.10    Recent Results (from the past 240 hour(s))  SARS CORONAVIRUS 2 (TAT 6-24 HRS) Nasopharyngeal Nasopharyngeal Swab     Status: None   Collection Time: 09/19/19 11:38 PM   Specimen: Nasopharyngeal Swab  Result Value Ref Range Status   SARS Coronavirus 2 NEGATIVE  NEGATIVE Final    Comment: (NOTE) SARS-CoV-2 target nucleic acids are NOT DETECTED. The SARS-CoV-2 RNA is generally detectable in upper and lower respiratory specimens during the acute phase of infection. Negative results do not preclude SARS-CoV-2 infection, do not rule out co-infections with other pathogens, and should not be used as the sole basis for treatment or other patient management decisions. Negative results must be combined with clinical observations, patient history, and epidemiological information. The expected result is Negative. Fact Sheet for Patients: HairSlick.no Fact Sheet for Healthcare Providers: quierodirigir.com This test is not yet approved or cleared by the [place] FDA and  has been authorized for detection and/or diagnosis of SARS-CoV-2 by FDA under an Emergency Use Authorization (EUA). This EUA will remain  in effect (meaning this test can be used) for the duration of the COVID-19 declaration under Section 56 4(b)(1) of the Act, 21 U.S.C. section 360bbb-3(b)(1), unless the authorization is terminated or revoked sooner. Performed at [facility], [address] [place], [place] [address]   Body fluid culture     Status: None (Preliminary result)   Collection Time: 09/23/19  5:27 PM   Specimen: Synovial, Right Knee; Body Fluid  Result Value Ref Range Status   Specimen Description JOINT FLUID  Final   Special Requests RIGHT KNEE  Final   Gram Stain   Final    WBC PRESENT,BOTH PMN AND MONONUCLEAR NO ORGANISMS SEEN CALLED T.Mina Marble  [id number]  @2024  BY V.WILKINS Performed at [facility], [address], [place], [place]    Culture PENDING  Incomplete   Report Status PENDING  Incomplete         Radiology Studies: DG Knee 1-2 Views Right  Result Date: 09/22/2019 CLINICAL DATA:  Right knee pain and swelling EXAM: RIGHT KNEE - 1-2 VIEW COMPARISON:  None. FINDINGS: Frontal and lateral views of the right knee demonstrate moderate joint effusion. There is 3 compartment osteoarthritis greatest in the lateral and patellofemoral compartments, moderate in severity. No fracture, subluxation, or dislocation. Diffuse subcutaneous edema. IMPRESSION: 1. Moderate 3 compartment osteoarthritis. 2. Moderate knee effusion. 3. Diffuse subcutaneous edema. Electronically Signed   By: 09/24/2019 M.D.   On: 09/22/2019 15:07        Scheduled Meds: . acetaminophen  500 mg Oral BID  . allopurinol  100 mg Oral Daily  . carvedilol  3.125 mg Oral BID WC  . Chlorhexidine Gluconate Cloth  6 each Topical Daily  . furosemide  40 mg Intravenous TID  . heparin  5,000 Units Subcutaneous Q8H  . isosorbide-hydrALAZINE  1 tablet Oral BID  . lidocaine  1 patch Transdermal Q24H  . lidocaine (PF)  15 mL Intradermal Once  . sodium chloride flush  3 mL Intravenous Q12H  . triamcinolone acetonide  60 mg Intra-articular Once   Continuous Infusions: . sodium chloride       LOS: 4 days    Time spent: 35 minutes    Avry Monteleone A Chea Malan, MD [facility]   If 7PM-7AM, please contact night-coverage [email or web address]  09/24/2019, 8:27 AM

## 2019-09-24 NOTE — Progress Notes (Signed)
Inpatient Rehabilitation Admissions Coordinator  Inpatient rehab consult received. I spoke with patient by phone to explain his options for inpatient acute rehab admission. His [facility] is out of network with [employer]. We discussed his coverage differences with in network vs out of network. He prefers rehab with in network coverage which would be [facility] and [facility] which are in network. I Have notified Dr. Michael Litter, RN CM and acute team. We will sign off at this time. Please call me with any questions.  Ottie Glazier, RN, MSN Rehab Admissions Coordinator [phone] 09/24/2019 10:37 AM

## 2019-09-24 NOTE — Progress Notes (Signed)
Pharmacist Heart Failure Core Measure Documentation  Assessment: Jack Chavez has an EF documented as 20-25% on 09/20/19 by echo.  Rationale: Heart failure patients with left ventricular systolic dysfunction (LVSD) and an EF < 40% should be prescribed an angiotensin converting enzyme inhibitor (ACEI) or angiotensin receptor blocker (ARB) at discharge unless a contraindication is documented in the medical record.  This patient is not currently on an ACEI or ARB for HF.  This note is being placed in the record in order to provide documentation that a contraindication to the use of these agents is present for this encounter.  ACE Inhibitor or Angiotensin Receptor Blocker is contraindicated (specify all that apply)  []   ACEI allergy AND ARB allergy []   Angioedema []   Moderate or severe aortic stenosis []   Hyperkalemia []   Hypotension []   Renal artery stenosis [x]   Worsening renal function, preexisting renal disease or dysfunction   , Pharm.D [phone] 09/24/2019 2:36 PM

## 2019-09-24 NOTE — Progress Notes (Addendum)
Progress Note  Patient Name: Jack Chavez Date of Encounter: 09/24/2019  Primary Cardiologist: Verne Carrow, MD    Subjective   Is a 64 year old gentleman who presented to the hospital with anasarca, biventricular acute systolic and diastolic congestive heart failure, CKD.  He has diuresed 10.3 L so far during this admission. Wt is down 28 lbs  Patient has a history of hypertension.  he has been noncompliant with his medications.    we added bidil to his med list 03/27  03/29: breathing better, thinks wt gain was about 50 lbs total Prior to admit, could not sleep on his back and describes PND also, never been evaluated for OSA Knee some better after drainage and steroids   Inpatient Medications    Scheduled Meds: . acetaminophen  500 mg Oral BID  . allopurinol  100 mg Oral Daily  . carvedilol  3.125 mg Oral BID WC  . Chlorhexidine Gluconate Cloth  6 each Topical Daily  . colchicine  0.3 mg Oral Daily  . furosemide  40 mg Intravenous TID  . heparin  5,000 Units Subcutaneous Q8H  . isosorbide-hydrALAZINE  1 tablet Oral BID  . lidocaine  1 patch Transdermal Q24H  . lidocaine (PF)  15 mL Intradermal Once  . sodium chloride flush  3 mL Intravenous Q12H  . triamcinolone acetonide  60 mg Intra-articular Once   Continuous Infusions: . sodium chloride     PRN Meds: sodium chloride, acetaminophen, sodium chloride flush   Vital Signs    Vitals:   09/23/19 1350 09/23/19 1738 09/23/19 2015 09/24/19 0500  BP: 125/67 126/65 (!) 143/91   Pulse: 97 96 94   Resp: 19  19   Temp: 98.4 F (36.9 C)  98.2 F (36.8 C)   TempSrc: Oral  Oral   SpO2: 97%  95%   Weight:    (!) 157.5 kg  Height:        Intake/Output Summary (Last 24 hours) at 09/24/2019 0902 Last data filed at 09/24/2019 0500 Gross per 24 hour  Intake 1518 ml  Output 3851 ml  Net -2333 ml   Last 3 Weights 09/24/2019 09/23/2019 09/22/2019  Weight (lbs) 347 lb 3.6 oz 353 lb 2.8 oz 361 lb 8.9 oz  Weight (kg)  157.5 kg 160.2 kg 164 kg      Telemetry    SR, no sig ectopy  ECG    None today  - Personally Reviewed  Physical Exam   Physical Exam: Blood pressure (!) 143/91, pulse 94, temperature 98.2 F (36.8 C), temperature source Oral, resp. rate 19, height 6\' 1"  (1.854 m), weight (!) 157.5 kg, SpO2 95 %.  General: Well developed, well nourished, male in no acute distress Head: Eyes PERRLA, Head normocephalic and atraumatic Lungs: decreased BS bases bilaterally to auscultation. Heart: HRRR S1 S2, without rub or gallop. No murmur. Upper extremity pulses are 2+ & equal.  JVD 9-10 cm Abdomen: Bowel sounds are present, abdomen soft and non-tender without masses or  hernias noted. Msk: Normal strength and tone for age. Extremities: No clubbing, cyanosis, 2+ edema. R knee bandaged   Skin:  No rashes or lesions noted. Neuro: Alert and oriented X 3. Psych:  Good affect, responds appropriately  Labs    High Sensitivity Troponin:  No results for input(s): TROPONINIHS in the last 720 hours.    Chemistry Recent Labs  Lab 09/19/19 2100 09/20/19 0317 09/22/19 0556 09/23/19 0514 09/24/19 0504  NA 139   < > 145 146* 141  K 5.4*   < > 4.2 4.0 4.4  CL 102   < > 97* 95* 91*  CO2 27   < > 33* 39* 39*  GLUCOSE 79   < > 75 103* 117*  BUN 26*   < > 24* 24* 24*  CREATININE 2.07*   < > 1.81* 1.66* 1.49*  CALCIUM 8.7*   < > 8.6* 8.4* 8.8*  PROT 7.3  --   --   --   --   ALBUMIN 3.7   < > 3.3* 3.1* 2.9*  AST 44*  --   --   --   --   ALT 21  --   --   --   --   ALKPHOS 66  --   --   --   --   BILITOT 2.1*  --   --   --   --   GFRNONAA 34*   < > 39* 44* 50*  GFRAA 39*   < > 46* 51* 58*  ANIONGAP 10   < > 15 12 11    < > = values in this interval not displayed.     Hematology Recent Labs  Lab 09/19/19 2100 09/22/19 0556 09/24/19 0504  WBC 5.1 6.6 5.3  RBC 5.58 5.37 4.74  HGB 16.0 15.2 13.8  HCT 51.2 49.1 43.6  MCV 91.8 91.4 92.0  MCH 28.7 28.3 29.1  MCHC 31.3 31.0 31.7  RDW 16.6*  16.3* 15.7*  PLT 288 228 243    BNP Recent Labs  Lab 09/19/19 2100  BNP 1,446.6*    Lab Results  Component Value Date   INR 1.5 (H) 09/22/2019    Radiology    DG Knee 1-2 Views Right  Result Date: 09/22/2019 CLINICAL DATA:  Right knee pain and swelling EXAM: RIGHT KNEE - 1-2 VIEW COMPARISON:  None. FINDINGS: Frontal and lateral views of the right knee demonstrate moderate joint effusion. There is 3 compartment osteoarthritis greatest in the lateral and patellofemoral compartments, moderate in severity. No fracture, subluxation, or dislocation. Diffuse subcutaneous edema. IMPRESSION: 1. Moderate 3 compartment osteoarthritis. 2. Moderate knee effusion. 3. Diffuse subcutaneous edema. Electronically Signed   By: 09/24/2019 M.D.   On: 09/22/2019 15:07    Cardiac Studies   ECHO: 03/25/20201 1. Severe global reduction in LV systolic function; moderate LVE; grade 2  diastolic dysfunction; biatrial enlargement; severe RV dysfunction; mild  TR with severe pulmonary hypertension.  2. Left ventricular ejection fraction, by estimation, is 20 to 25%. The  left ventricle has severely decreased function. The left ventricle  demonstrates global hypokinesis. The left ventricular internal cavity size  was moderately dilated. Left  ventricular diastolic parameters are consistent with Grade II diastolic  dysfunction (pseudonormalization). Elevated left atrial pressure.  3. Right ventricular systolic function is severely reduced. The right  ventricular size is normal. There is severely elevated pulmonary artery  systolic pressure.  4. Left atrial size was mildly dilated.  5. Right atrial size was moderately dilated.  6. The mitral valve is normal in structure. Trivial mitral valve  regurgitation. No evidence of mitral stenosis.  7. The aortic valve is tricuspid. Aortic valve regurgitation is not  visualized. No aortic stenosis is present.  8. The inferior vena cava is dilated in size  with <50% respiratory  variability, suggesting right atrial pressure of 15 mmHg.   Patient Profile     64 y.o. male with acute on chronic systolic and diastolic congestive heart failure, acute on  chronic kidney  disease  Assessment & Plan    1.  Acute biventricular CHF with severe pulmonary hypertension:   - Cr continues to trend down w/ diuresis - K+ stable w/out supplement, follow - on low-dose BB and Bidil - once his volume status is improved, needs cardiac cath  2.  Renal Insufficiency:  -Follow daily, creatinine is improving, BUN stable  3.  Gout: -Per IM, but improved after drainage and steroid injection   For questions or updates, please contact [place] Please consult [email or web address] for contact info under        Signed, Rosaria Ferries, PA-C  09/24/2019, 9:02 AM   .  Attending Note:   The patient was seen and examined.  Agree with assessment and plan as noted above.  Changes made to the above note as needed.  Patient seen and independently examined with Rosaria Ferries, PA .   We discussed all aspects of the encounter. I agree with the assessment and plan as stated above.  1.   Acute on chronic biventricular congestive heart failure with pulmonary hypertension: Continues to diurese.  He is feeling better.  Anticipate doing a heart catheterization after we have gotten more fluid off.  This will likely take place in the middle to the end of next week.  2.  Renal insufficiency: Creatinine continues to gradually improve.  Creatinine was 1.66 yesterday and this morning is 1.49.  Potassium is stable at 4.4.  3.  Morbid obesity: He will need to work on a prudent diet, exercise, weight loss program since she is out of the hospital.   I have spent a total of 40 minutes with patient reviewing hospital  notes , telemetry, EKGs, labs and examining patient as well as establishing an assessment and plan that was discussed with the patient. > 50% of time was spent in direct  patient care.    Thayer Headings, Brooke Bonito., MD, [facility] 09/24/2019, 12:37 PM [address]  [place] Pager [phone]

## 2019-09-25 LAB — RENAL FUNCTION PANEL
Albumin: 2.9 g/dL — ABNORMAL LOW (ref 3.5–5.0)
Anion gap: 10 (ref 5–15)
BUN: 32 mg/dL — ABNORMAL HIGH (ref 8–23)
CO2: 37 mmol/L — ABNORMAL HIGH (ref 22–32)
Calcium: 8.8 mg/dL — ABNORMAL LOW (ref 8.9–10.3)
Chloride: 92 mmol/L — ABNORMAL LOW (ref 98–111)
Creatinine, Ser: 1.65 mg/dL — ABNORMAL HIGH (ref 0.61–1.24)
GFR calc Af Amer: 51 mL/min — ABNORMAL LOW (ref >60)
GFR calc non Af Amer: 44 mL/min — ABNORMAL LOW (ref >60)
Glucose, Bld: 140 mg/dL — ABNORMAL HIGH (ref 70–99)
Phosphorus: 4.8 mg/dL — ABNORMAL HIGH (ref 2.5–4.6)
Potassium: 4.9 mmol/L (ref 3.5–5.1)
Sodium: 139 mmol/L (ref 135–145)

## 2019-09-25 LAB — MAGNESIUM: Magnesium: 2 mg/dL (ref 1.7–2.4)

## 2019-09-25 MED ORDER — SODIUM ZIRCONIUM CYCLOSILICATE 5 G PO PACK
5.0000 g | PACK | Freq: Once | ORAL | Status: AC
  Administered 2019-09-25: 5 g via ORAL
  Filled 2019-09-25: qty 1

## 2019-09-25 MED ORDER — FUROSEMIDE 10 MG/ML IJ SOLN
60.0000 mg | Freq: Once | INTRAMUSCULAR | Status: AC
  Administered 2019-09-25: 60 mg via INTRAVENOUS
  Filled 2019-09-25: qty 6

## 2019-09-25 NOTE — Progress Notes (Signed)
PROGRESS NOTE    Jack Chavez  GGY:[id number] DOB: [birth year] DOA: 09/19/2019 PCP: Patient, No Pcp Per   Brief Narrative: 64 year old with past medical history significant for hypertension morbid obesity who presents complaining of worsening shortness of breath on exertion and peripheral edema.  He reports 10 days history of shortness of breath on exertion.  He also reports abdominal distention.  He ran of his hydrochlorothiazide for a month.  He reports diarrhea.  He went to an urgent care center and was advised to come to the ED due to abnormal kidney function.  Patient was admitted with acute hypoxic respiratory failure found to have biventricular heart failure and acute on chronic renal failure.  He was a started on IV diuresis.  He has lost more than 30 pounds so far.  Patient will require ischemic evaluation prior to discharge.  Assessment & Plan:   Principal Problem:   Acute exacerbation of CHF (congestive heart failure) (HCC) Active Problems:   Renal insufficiency   Hypokalemia   Serum total bilirubin elevated   Hypertension   Acute systolic CHF (congestive heart failure), NYHA class 4 (HCC)   Effusion, right knee  1-Acute systolic and diastolic heart failure exacerbation Treated with  IV Lasix 40 mg IV TID initially. Echocardiogram showed ejection fraction 20 to 25% diastolic dysfunction, generalized hypokinesis.  Cardiology consulted. Patient will required right and left [place] cath.  Daily weight. 375---353--347--338 Negative 11 L.  Cardiology started hydralazine/Imdur.   Receive 60 mg of IV Lexis today.  2-CKD stage 3, pre nephrology , less likely AKI Treated with lasix 40 mg TID.  Nephrology consulted.  Cr stable.  Place foley, dermitis  scrotum skin due to incontinence of urine. Plan to remove foley 3-30. Renal function stable. Improved down to 1.4--1.6 Received a dose of acetazolamide. Nephrology ordered 60 mg of IV Lasix x1 dose today.  3-Acute hypoxic  respiratory failure, requiring oxygen supplementation.  Oxygen decreased to 70 on Room Air, improved on 3 L.  In setting heart failure.  Treated with IV lasix  Improving.  Diarrhea; Report 3 episode watery stool daily.  GI pathogen ordered.  No further diarrhea in the hospital.  Mild elevation Bili;  [place]; Very nodular hepatic contour suggesting cirrhosis. Moderate ascites. Ascites; might need paracentesis.   HTN; Continue with lasix.   Knee pain; Gout;  knee effusion. Had Arthrocentesis done by Dr Roda Shutters. Synovial fluid with 32,000 monosodium urate crystal.  Started  Allopurinol.  Hold on colchicine due to AKI  Hyperkalemia; received lokelma   Estimated body mass index is 44.71 kg/m as calculated from the following:   Height as of this encounter: 6\' 1"  (1.854 m).   Weight as of this encounter: 153.7 kg.   DVT prophylaxis:  Heparin  Code Status: full code Family Communication: care discussed with patient.  Disposition Plan:  Patient is from: home  Anticipated d/c date: in 2--3 days skilled nursing facility Barriers to d/c or necessity for inpatient status: remain in the hospital for management of acute Systolic HF, requiring IV lasix.  Awaiting ischemic evaluation  Consultants:   Cardiology  Nephrology   Procedures:   ECHO Systolic and diastolic HF, Ef   Antimicrobials:    Subjective: Patient report breathing a little bit better, knee pain has improved.  Objective: Vitals:   09/24/19 2042 09/25/19 0002 09/25/19 0400 09/25/19 0846  BP: (!) 106/55 110/63 123/87   Pulse: 90 88 88   Resp: 18 18 20    Temp: 97.9 F (36.6 C)  98.2 F (36.8 C) 99.4 F (37.4 C)   TempSrc: Oral Oral Oral   SpO2: 99% 100% 100%   Weight:    (!) 153.7 kg  Height:        Intake/Output Summary (Last 24 hours) at 09/25/2019 0911 Last data filed at 09/25/2019 0407 Gross per 24 hour  Intake 843 ml  Output 2200 ml  Net -1357 ml   Filed Weights   09/23/19 0555 09/24/19 0500  09/25/19 0846  Weight: (!) 160.2 kg (!) 157.5 kg (!) 153.7 kg    Examination:  General exam: NAD Respiratory system: Crackles at the bases Cardiovascular system: S1, S2 regular rhythm or rate Gastrointestinal system: Abdomen less distended, bowel sounds present, soft nontender umbilical hernia Central nervous system: Nonfocal Extremities: +2 edema.    Data Reviewed: I have personally reviewed following labs and imaging studies  CBC: Recent Labs  Lab 09/19/19 2100 09/22/19 0556 09/24/19 0504  WBC 5.1 6.6 5.3  HGB 16.0 15.2 13.8  HCT 51.2 49.1 43.6  MCV 91.8 91.4 92.0  PLT 288 228 243   Basic Metabolic Panel: Recent Labs  Lab 09/21/19 0529 09/22/19 0556 09/23/19 0514 09/24/19 0504 09/25/19 0531  NA 142 145 146* 141 139  K 4.7 4.2 4.0 4.4 4.9  CL 103 97* 95* 91* 92*  CO2 30 33* 39* 39* 37*  GLUCOSE 99 75 103* 117* 140*  BUN 27* 24* 24* 24* 32*  CREATININE 2.10* 1.81* 1.66* 1.49* 1.65*  CALCIUM 8.6* 8.6* 8.4* 8.8* 8.8*  MG  --   --  1.7  --  2.0  PHOS 5.2* 4.5 4.9* 3.8 4.8*   GFR: Estimated Creatinine Clearance: 72.7 mL/min (A) (by C-G formula based on SCr of 1.65 mg/dL (H)). Liver Function Tests: Recent Labs  Lab 09/19/19 2100 09/20/19 1453 09/22/19 0556 09/23/19 0514 09/24/19 0504 09/24/19 0554 09/25/19 0531  AST 44*  --   --   --   --  19  --   ALT 21  --   --   --   --  12  --   ALKPHOS 66  --   --   --   --  49  --   BILITOT 2.1*  --   --   --   --  1.1  --   PROT 7.3  --   --   --   --  6.8  --   ALBUMIN 3.7   < > 3.3* 3.1* 2.9* 3.1* 2.9*   < > = values in this interval not displayed.   Recent Labs  Lab 09/19/19 2100  LIPASE 36   No results for input(s): AMMONIA in the last 168 hours. Coagulation Profile: Recent Labs  Lab 09/22/19 0556  INR 1.5*   Cardiac Enzymes: No results for input(s): CKTOTAL, CKMB, CKMBINDEX, TROPONINI in the last 168 hours. BNP (last 3 results) No results for input(s): PROBNP in the last 8760  hours. HbA1C: No results for input(s): HGBA1C in the last 72 hours. CBG: No results for input(s): GLUCAP in the last 168 hours. Lipid Profile: No results for input(s): CHOL, HDL, LDLCALC, TRIG, CHOLHDL, LDLDIRECT in the last 72 hours. Thyroid Function Tests: No results for input(s): TSH, T4TOTAL, FREET4, T3FREE, THYROIDAB in the last 72 hours. Anemia Panel: No results for input(s): VITAMINB12, FOLATE, FERRITIN, TIBC, IRON, RETICCTPCT in the last 72 hours. Sepsis Labs: Recent Labs  Lab 09/20/19 0317  PROCALCITON <0.10    Recent Results (from the past 240 hour(s))  SARS CORONAVIRUS  2 (TAT 6-24 HRS) Nasopharyngeal Nasopharyngeal Swab     Status: None   Collection Time: 09/19/19 11:38 PM   Specimen: Nasopharyngeal Swab  Result Value Ref Range Status   SARS Coronavirus 2 NEGATIVE NEGATIVE Final    Comment: (NOTE) SARS-CoV-2 target nucleic acids are NOT DETECTED. The SARS-CoV-2 RNA is generally detectable in upper and lower respiratory specimens during the acute phase of infection. Negative results do not preclude SARS-CoV-2 infection, do not rule out co-infections with other pathogens, and should not be used as the sole basis for treatment or other patient management decisions. Negative results must be combined with clinical observations, patient history, and epidemiological information. The expected result is Negative. Fact Sheet for Patients: SugarRoll.be Fact Sheet for Healthcare Providers: https://www.woods-mathews.com/ This test is not yet approved or cleared by the [place] FDA and  has been authorized for detection and/or diagnosis of SARS-CoV-2 by FDA under an Emergency Use Authorization (EUA). This EUA will remain  in effect (meaning this test can be used) for the duration of the COVID-19 declaration under Section 56 4(b)(1) of the Act, 21 U.S.C. section 360bbb-3(b)(1), unless the authorization is terminated or revoked  sooner. Performed at [facility], [place] [address] [place], [place] [address]   Body fluid culture     Status: None (Preliminary result)   Collection Time: 09/23/19  5:27 PM   Specimen: Synovial, Right Knee; Body Fluid  Result Value Ref Range Status   Specimen Description JOINT FLUID  Final   Special Requests RIGHT KNEE  Final   Gram Stain   Final    WBC PRESENT,BOTH PMN AND MONONUCLEAR NO ORGANISMS SEEN CALLED T.Delmar Landau  [id number] @2024  BY V.WILKINS Performed at [facility], [place] [address] [place],  [address]    Culture PENDING  Incomplete   Report Status PENDING  Incomplete         Radiology Studies: No results found.      Scheduled Meds: . acetaminophen  500 mg Oral BID  . allopurinol  100 mg Oral Daily  . Chlorhexidine Gluconate Cloth  6 each Topical Daily  . heparin  5,000 Units Subcutaneous Q8H  . isosorbide-hydrALAZINE  1 tablet Oral BID  . lidocaine  1 patch Transdermal Q24H  . lidocaine (PF)  15 mL Intradermal Once  . metoprolol succinate  25 mg Oral Daily  . sodium chloride flush  3 mL Intravenous Q12H  . sodium zirconium cyclosilicate  5 g Oral Once   Continuous Infusions: . sodium chloride       LOS: 5 days    Time spent: 35 minutes    Kiah Keay A Jibran Crookshanks, MD [facility]   If 7PM-7AM, please contact night-coverage [email or web address]  09/25/2019, 9:11 AM

## 2019-09-25 NOTE — TOC Progression Note (Signed)
Transition of Care [facility]) - Progression Note    Patient Details  Name: Jack Chavez MRN: [id number] Date of Birth: 05/27/1956  Transition of [facility]) CM/SW Contact  Jocilynn Grade, Olegario Messier, RN Phone Number: 09/25/2019, 12:52 PM  Clinical Narrative: Per patient permission agreed to fax out to SNF.      Expected Discharge Plan: Skilled Nursing Facility Barriers to Discharge: Continued Medical Work up  Expected Discharge Plan and Services Expected Discharge Plan: Skilled Nursing Facility   Discharge Planning Services: CM Consult   Living arrangements for the past 2 months: Single Family Home                                       Social Determinants of Health (SDOH) Interventions    Readmission Risk Interventions No flowsheet data found.

## 2019-09-25 NOTE — TOC Progression Note (Signed)
Transition of Care [facility]) - Progression Note    Patient Details  Name: Jack Chavez MRN: [id number] Date of Birth: 08/05/1955  Transition of Care [facility]) CM/SW Contact  Kortney Schoenfelder, Olegario Messier, RN Phone Number: 09/25/2019, 4:01 PM  Clinical Narrative: 1. 1.5 mi [facility] [address] [place], [place] [address] [phone] Overall rating Much above average 2. 1.5 mi [address] at [place], [place] [address] [place], [place] [address] [phone] Overall rating Below average 3. 2.2 mi [facility] and [facility] [address] [place], [place] [address] [phone] Overall rating Average 4. 2.4 mi [facility] [place] [address] [place], [place] [address] [phone] Overall rating Much below average 5. 2.7 mi [facility] at the [facility] [address] [place], [place] [address] [phone] Overall rating Below average 6. 2.9 mi [facility] and [facility] [address] [place], [place] [address] [phone] Overall rating Much below average 7. 3.1 mi [facility] [address] [place], [place] [address] [phone] Overall rating Much above average 8. 3.6 mi [facility] [address] [place], [place] [address] [phone] Overall rating Much below average 9. 3.7 mi [facility] [address] [place], [place] [address] [phone] Overall rating Below average 10. 3.8 mi [facility] [address] [place], [place] [address] [phone] Overall rating Below average 11. 4.2 mi [facility] [address] [place], [place] [address] [phone] Overall rating Much above average 12. 4.7 mi [facility] [address] [place], [place] [address] [phone] Overall  rating Much above average 13. 5.5 mi [facility] [address] [place], [place] [address] [phone] Overall rating Average 14. 8 mi [facility] [address] [place], [place] [address] [phone] Overall rating Above average 15. 8.8 mi [facility] [address] [place], [place] [address] [phone] Overall rating Much below average    16. 9 mi [facility] [address] [place], [place] [address] [phone] Overall rating Below average 17. 9.3 mi [facility] [address] [place], [place] [address] [phone] Overall rating Much above average 18. 10.8 mi [place] [facility] [address] [place], [place] [address] [phone] Overall rating Much above average 19. 12.8 [facility] [address] [place], [place] [address] [phone] Overall rating Much below average 20. 12.8 mi [facility] [address] [place], [place] [address] [phone] Overall rating Much below average 21. 13.1 mi [address] [place], [place] [address] [phone] Overall rating Much below average 22. 14.2 mi [facility] [address] [place], [place] [address] [phone] Overall rating Much below average 23. 14.3 mi [facility] [address] [place], [place] [address] [phone] Overall rating Much below average 24. 14.4 mi [facility] [address] [place], [place] [address] [phone] Overall rating Below average 25. 14.8 mi [facility] and [facility] [address] [place], [place] [address] [phone] Overall rating Below average 26. 16.5 mi [facility] [address] [place], [place] [address] [phone] Overall  rating Below average 27. 16.7 mi [facility] [address] [place],  [place] [address] [phone] Overall rating Much above average 28. 16.9 mi [facility] [place], [place] [address] [phone] Overall rating Much above average 29. 17.8 mi [facility] [place],  [address] [phone] Overall rating Below average 30. 41.3 [facility] [address] [place],  [address] [phone] Overall rating Much below average    Expected Discharge Plan: [place] Barriers to Discharge: Continued Medical Work up  Expected Discharge Plan and Services Expected Discharge Plan: [place]   Discharge Planning Services: CM Consult   Living arrangements for the past 2 months: Single Family Home                                       Social Determinants of Health (SDOH) Interventions    Readmission Risk Interventions No flowsheet data found.

## 2019-09-25 NOTE — TOC Progression Note (Signed)
Transition of Care [facility]) - Progression Note    Patient Details  Name: [name] MRN: [id number] Date of Birth: October 24, 1955  Transition of Care [facility]) CM/SW Contact  Hanadi Stanly, Olegario Messier, RN Phone Number: 09/25/2019, 4:10 PM  Clinical Narrative:   1. 2.3 mi [facility] [address] [place], [place] [address] [phone] Overall rating Average 2.    Expected Discharge Plan: Skilled Nursing Facility Barriers to Discharge: Continued Medical Work up  Expected Discharge Plan and Services Expected Discharge Plan: Skilled Nursing Facility   Discharge Planning Services: CM Consult   Living arrangements for the past 2 months: Single Family Home                                       Social Determinants of Health (SDOH) Interventions    Readmission Risk Interventions No flowsheet data found.

## 2019-09-25 NOTE — Progress Notes (Signed)
Physical Therapy Treatment Patient Details Name: Jack Chavez MRN: [id number] DOB: Nov 01, 1955 Today's Date: 09/25/2019    History of Present Illness 64 year old with past medical history significant for hypertension morbid obesity who presents complaining of worsening shortness of breath on exertion and peripheral edema. Acute dx of Acute exacerbation of CHF and R knee pain    PT Comments    Progressing slowly with mobility. Moderate R knee pain and scrotal pain 2* edema. Pt continues to require increased time and significant assistance for mobility. He remains at risk for falls when mobilizing. He is motivated to regain his physical function. Unfortunately, CIR declined admission. Since CIR declined, pt will need SNF. Will continue to follow and progress activity as tolerated.     Follow Up Recommendations  SNF(Will need SNF since CIR declined)     Equipment Recommendations  None recommended by PT    Recommendations for Other Services       Precautions / Restrictions Precautions Precautions: Fall Precaution Comments: monitor O2 stats Restrictions Weight Bearing Restrictions: No    Mobility  Bed Mobility Overal bed mobility: Needs Assistance Bed Mobility: Supine to Sit     Supine to sit: Mod assist;HOB elevated     General bed mobility comments: Assist to manage R LE and to scoot towards EOB. Pt relies heavily on bed railing and HOB raised to max upright position. Increased time. Cues for safety, technique  Transfers Overall transfer level: Needs assistance Equipment used: Rolling walker (2 wheeled) Transfers: Sit to/from Stand Sit to Stand: Min assist;From elevated surface         General transfer comment: Highly elevated bed required for sit to stand transitions. Increased time. Cues for safety, technique, hand placement/feet placement. Assist to power up, stabilize, control descent.  Ambulation/Gait Ambulation/Gait assistance: Min assist;+2  safety/equipment Gait Distance (Feet): 3 Feet Assistive device: Rolling walker (2 wheeled) Gait Pattern/deviations: Step-to pattern;Trunk flexed;Antalgic     General Gait Details: VCs safety, technique, sequence, posture, distanace from RW, proper use of RW. Distance limited by fatigue, pain (R knee and scrotrum). Pt was able to walk 3 feet forwards then backwards. Assist to stabilize pt and manage RW. High fall risk. Remained on [place] O2 with activity.    Stairs             Wheelchair Mobility    Modified Rankin (Stroke Patients Only)       Balance Overall balance assessment: Needs assistance         Standing balance support: Bilateral upper extremity supported Standing balance-Leahy Scale: Poor                              Cognition Arousal/Alertness: Awake/alert Behavior During Therapy: WFL for tasks assessed/performed Overall Cognitive Status: Within Functional Limits for tasks assessed                                        Exercises      General Comments        Pertinent Vitals/Pain Pain Assessment: 0-10 Pain Score: 6  Pain Location: R knee Pain Descriptors / Indicators: Aching;Discomfort;Sore Pain Intervention(s): Limited activity within patient's tolerance;Repositioned;Ice applied    Home Living                      Prior Function  PT Goals (current goals can now be found in the care plan section) Progress towards PT goals: Progressing toward goals    Frequency    Min 3X/week      PT Plan Discharge plan needs to be updated(CIR declined)    Co-evaluation              AM-PAC PT "6 Clicks" Mobility   Outcome Measure  Help needed turning from your back to your side while in a flat bed without using bedrails?: A Lot Help needed moving from lying on your back to sitting on the side of a flat bed without using bedrails?: A Lot Help needed moving to and from a bed to a chair (including a  wheelchair)?: A Lot Help needed standing up from a chair using your arms (e.g., wheelchair or bedside chair)?: A Lot Help needed to walk in hospital room?: A Lot Help needed climbing 3-5 steps with a railing? : Total 6 Click Score: 11    End of Session Equipment Utilized During Treatment: Gait belt;Oxygen Activity Tolerance: Patient limited by fatigue;Patient limited by pain Patient left: in bed;with call bell/phone within reach;with nursing/sitter in room   PT Visit Diagnosis: Muscle weakness (generalized) (M62.81);Pain;Other abnormalities of gait and mobility (R26.89) Pain - Right/Left: Right Pain - part of body: Knee     Time: 8832-5498 PT Time Calculation (min) (ACUTE ONLY): 33 min  Charges:  $Gait Training: 23-37 mins              Doreatha Massed, PT Acute Rehabilitation

## 2019-09-25 NOTE — [place] FL2 (Signed)
[place] MEDICAID FL2 LEVEL OF CARE SCREENING TOOL     IDENTIFICATION  Patient Name: Jack Chavez Birthdate: [birth year] Sex: male Admission Date (Current Location): 09/19/2019  [facility] and [place] Number:  Producer, television/film/video and Address:  [facility],  [address] [place] [address]      Provider Number: [id number]  Attending Physician Name and Address:  Alba Cory, MD  Relative Name and Phone Number:  Maximos Zayas [phone]    Current Level of Care: Hospital Recommended Level of Care: Skilled Nursing Facility Prior Approval Number:    Date Approved/Denied:   PASRR Number: [id number]  Discharge Plan: SNF    Current Diagnoses: Patient Active Problem List   Diagnosis Date Noted  . Effusion, right knee 09/23/2019  . Acute systolic CHF (congestive heart failure), NYHA class 4 (HCC)   . Acute exacerbation of CHF (congestive heart failure) (HCC) 09/20/2019  . Renal insufficiency 09/20/2019  . Hypokalemia 09/20/2019  . Serum total bilirubin elevated 09/20/2019  . Hypertension 09/20/2019    Orientation RESPIRATION BLADDER Height & Weight     Self, Time, Situation, Place  O2(02 2lnc continuous) Indwelling catheter(urine retention) Weight: (!) 153.7 kg Height:  6\' 1"  (185.4 cm)  BEHAVIORAL SYMPTOMS/MOOD NEUROLOGICAL BOWEL NUTRITION STATUS      Continent Diet(1200 fluid restriction;thin heart healthy)  AMBULATORY STATUS COMMUNICATION OF NEEDS Skin   Limited Assist Verbally Normal                       Personal Care Assistance Level of Assistance  Bathing, Feeding, Dressing Bathing Assistance: Limited assistance Feeding assistance: Limited assistance Dressing Assistance: Limited assistance     Functional Limitations Info  Sight, Hearing, Speech Sight Info: Adequate Hearing Info: Adequate Speech Info: Adequate    SPECIAL CARE FACTORS FREQUENCY  PT (By licensed PT), OT (By licensed OT)     PT Frequency: 5x week(declined for  CIR-not able to tolerate 3hrs therapy daily;need lower level-SNF) OT Frequency: 5x week            Contractures Contractures Info: Not present    Additional Factors Info  Code Status, Allergies Code Status Info: Full code Allergies Info: morphine and related           Current Medications (09/25/2019):  This is the current hospital active medication list Current Facility-Administered Medications  Medication Dose Route Frequency Provider Last Rate Last Admin  . 0.9 %  sodium chloride infusion  250 mL Intravenous PRN 09/27/2019, MD      . acetaminophen (TYLENOL) tablet 500 mg  500 mg Oral BID Regalado, Belkys A, MD   500 mg at 09/25/19 0920  . acetaminophen (TYLENOL) tablet 650 mg  650 mg Oral Q4H PRN 09/27/19, MD   650 mg at 09/23/19 0210  . allopurinol (ZYLOPRIM) tablet 100 mg  100 mg Oral Daily Regalado, Belkys A, MD   100 mg at 09/25/19 0920  . Chlorhexidine Gluconate Cloth 2 % PADS 6 each  6 each Topical Daily Regalado, Belkys A, MD   6 each at 09/25/19 0921  . heparin injection 5,000 Units  5,000 Units Subcutaneous Q8H 09/27/19, MD   5,000 Units at 09/25/19 0549  . isosorbide-hydrALAZINE (BIDIL) 20-37.5 MG per tablet 1 tablet  1 tablet Oral BID Nahser, 09/27/19, MD   1 tablet at 09/25/19 0920  . lidocaine (LIDODERM) 5 % 1 patch  1 patch Transdermal Q24H Kyere, 09/27/19, NP   1 patch  at 09/25/19 0918  . lidocaine (PF) (XYLOCAINE) 1 % injection 15 mL  15 mL Intradermal Once Leandrew Koyanagi, MD      . metoprolol succinate (TOPROL-XL) 24 hr tablet 25 mg  25 mg Oral Daily Barrett, Rhonda G, PA-C   25 mg at 09/25/19 0920  . sodium chloride flush (NS) 0.9 % injection 3 mL  3 mL Intravenous Q12H Shela Leff, MD   3 mL at 09/25/19 0921  . sodium chloride flush (NS) 0.9 % injection 3 mL  3 mL Intravenous PRN Shela Leff, MD         Discharge Medications: Please see discharge summary for a list of discharge medications.  Relevant Imaging  Results:  Relevant Lab Results:   Additional Information ss[id number] [place]  [place]t Mccutchen, Juliann Pulse, RN

## 2019-09-25 NOTE — TOC Progression Note (Signed)
Transition of Care [facility]) - Progression Note    Patient Details  Name: Jack Chavez MRN: [id number] Date of Birth: 16-Jun-1956  Transition of [facility]) CM/SW Contact  Kenji Mapel, Olegario Messier, RN Phone Number: 09/25/2019, 9:20 AM  Clinical Narrative:  Spoke to both [facility], & [place]-they declined recc SNF.     Expected Discharge Plan: Skilled Nursing Facility Barriers to Discharge: Continued Medical Work up  Expected Discharge Plan and Services Expected Discharge Plan: Skilled Nursing Facility   Discharge Planning Services: CM Consult   Living arrangements for the past 2 months: Single Family Home                                       Social Determinants of Health (SDOH) Interventions    Readmission Risk Interventions No flowsheet data found.

## 2019-09-25 NOTE — Progress Notes (Addendum)
Progress Note  Patient Name: Jack Chavez Date of Encounter: 09/25/2019  Primary Cardiologist: Lauree Chandler, MD    Subjective   Is a 64 year old gentleman who presented to the hospital with anasarca, biventricular acute systolic and diastolic congestive heart failure, CKD.  He has diuresed 11.6 L so far during this admission. Wt is down ? Lbs>>no weight today, tech aware  Patient has a history of hypertension.  he has been noncompliant with his medications.    we added bidil to his med list 03/27  03/30: feels resp close to baseline, still sleepy, lying almost flat in the bed, on O2. Knee is better, he may be able to ambulate today   Inpatient Medications    Scheduled Meds: . acetaminophen  500 mg Oral BID  . allopurinol  100 mg Oral Daily  . Chlorhexidine Gluconate Cloth  6 each Topical Daily  . heparin  5,000 Units Subcutaneous Q8H  . isosorbide-hydrALAZINE  1 tablet Oral BID  . lidocaine  1 patch Transdermal Q24H  . lidocaine (PF)  15 mL Intradermal Once  . metoprolol succinate  25 mg Oral Daily  . sodium chloride flush  3 mL Intravenous Q12H   Continuous Infusions: . sodium chloride     PRN Meds: sodium chloride, acetaminophen, sodium chloride flush   Vital Signs    Vitals:   09/24/19 1527 09/24/19 2042 09/25/19 0002 09/25/19 0400  BP: (!) 147/85 (!) 106/55 110/63 123/87  Pulse: 94 90 88 88  Resp: 18 18 18 20   Temp: 98.2 F (36.8 C) 97.9 F (36.6 C) 98.2 F (36.8 C) 99.4 F (37.4 C)  TempSrc: Oral Oral Oral Oral  SpO2: 95% 99% 100% 100%  Weight:      Height:        Intake/Output Summary (Last 24 hours) at 09/25/2019 0755 Last data filed at 09/25/2019 0407 Gross per 24 hour  Intake 843 ml  Output 2200 ml  Net -1357 ml   Last 3 Weights 09/24/2019 09/23/2019 09/22/2019  Weight (lbs) 347 lb 3.6 oz 353 lb 2.8 oz 361 lb 8.9 oz  Weight (kg) 157.5 kg 160.2 kg 164 kg      Telemetry    SR, rare PVCs  ECG    None today  - Personally  Reviewed  Physical Exam   Physical Exam: Blood pressure 123/87, pulse 88, temperature 99.4 F (37.4 C), temperature source Oral, resp. rate 20, height 6\' 1"  (1.854 m), weight (!) 157.5 kg, SpO2 100 %.  GEN: No acute distress.   Neck: JVD 8 cm, but difficult to see 2nd body habitus Cardiac: RRR, no murmurs, rubs, or gallops.  Respiratory: decreased BS bases, but improving GI: Soft, nontender, non-distended  MS: 2+ LE edema; No deformity. R knee bandaged Neuro:  Nonfocal  Psych: Normal affect   Labs    High Sensitivity Troponin:  No results for input(s): TROPONINIHS in the last 720 hours.    Chemistry Recent Labs  Lab 09/19/19 2100 09/20/19 0317 09/23/19 0514 09/23/19 0514 09/24/19 0504 09/24/19 0554 09/25/19 0531  NA 139   < > 146*  --  141  --  139  K 5.4*   < > 4.0  --  4.4  --  4.9  CL 102   < > 95*  --  91*  --  92*  CO2 27   < > 39*  --  39*  --  37*  GLUCOSE 79   < > 103*  --  117*  --  140*  BUN 26*   < > 24*  --  24*  --  32*  CREATININE 2.07*   < > 1.66*  --  1.49*  --  1.65*  CALCIUM 8.7*   < > 8.4*  --  8.8*  --  8.8*  PROT 7.3  --   --   --   --  6.8  --   ALBUMIN 3.7   < > 3.1*   < > 2.9* 3.1* 2.9*  AST 44*  --   --   --   --  19  --   ALT 21  --   --   --   --  12  --   ALKPHOS 66  --   --   --   --  49  --   BILITOT 2.1*  --   --   --   --  1.1  --   GFRNONAA 34*   < > 44*  --  50*  --  44*  GFRAA 39*   < > 51*  --  58*  --  51*  ANIONGAP 10   < > 12  --  11  --  10   < > = values in this interval not displayed.     Hematology Recent Labs  Lab 09/19/19 2100 09/22/19 0556 09/24/19 0504  WBC 5.1 6.6 5.3  RBC 5.58 5.37 4.74  HGB 16.0 15.2 13.8  HCT 51.2 49.1 43.6  MCV 91.8 91.4 92.0  MCH 28.7 28.3 29.1  MCHC 31.3 31.0 31.7  RDW 16.6* 16.3* 15.7*  PLT 288 228 243    BNP Recent Labs  Lab 09/19/19 2100  BNP 1,446.6*    Lab Results  Component Value Date   INR 1.5 (H) 09/22/2019    Radiology    No results found.  Cardiac Studies    ECHO: 03/25/20201 1. Severe global reduction in LV systolic function; moderate LVE; grade 2  diastolic dysfunction; biatrial enlargement; severe RV dysfunction; mild  TR with severe pulmonary hypertension.  2. Left ventricular ejection fraction, by estimation, is 20 to 25%. The  left ventricle has severely decreased function. The left ventricle  demonstrates global hypokinesis. The left ventricular internal cavity size  was moderately dilated. Left  ventricular diastolic parameters are consistent with Grade II diastolic  dysfunction (pseudonormalization). Elevated left atrial pressure.  3. Right ventricular systolic function is severely reduced. The right  ventricular size is normal. There is severely elevated pulmonary artery  systolic pressure.  4. Left atrial size was mildly dilated.  5. Right atrial size was moderately dilated.  6. The mitral valve is normal in structure. Trivial mitral valve  regurgitation. No evidence of mitral stenosis.  7. The aortic valve is tricuspid. Aortic valve regurgitation is not  visualized. No aortic stenosis is present.  8. The inferior vena cava is dilated in size with <50% respiratory  variability, suggesting right atrial pressure of 15 mmHg.   Patient Profile     64 y.o. male with acute on chronic systolic and diastolic congestive heart failure, acute on  chronic kidney disease  Assessment & Plan    1.  Acute biventricular CHF with severe pulmonary hypertension:   - BUN/Cr now trending up - K+ trending up as well, despite getting acetazolamide 500 mg last pm - will give Lokelma today, he got it earlier this admit for K+ 5.4 - continue BB and Bidil, do not think BP will tolerate any increase - may be  able to up-titrate BB as outpt - needs ischemic eval, timing per MD - Lasix d/c'd early this am, no dose today  2.  Renal Insufficiency:  - BUN/Cr were stable, but now trending up  3.  Gout: - improved after drainage and steroid  injection - colchicine never given - per IM/Ortho   For questions or updates, please contact [facility] Please consult [email or web address] for contact info under        Signed, Theodore Demark, PA-C  09/25/2019, 7:55 AM     Attending Note:   The patient was seen and examined.  Agree with assessment and plan as noted above.  Changes made to the above note as needed.  Patient seen and independently examined with Theodore Demark, PA .   We discussed all aspects of the encounter. I agree with the assessment and plan as stated above.  1.  Acute on chronic combined congestive heart failure: The patient has diuresed over 11 L.  Creatinine is fairly stable.  I do think he needs a heart catheterization but we will wait and diurese him a bit further   2.  Renal insufficiency:  Plans per im  3.  Morbid obesity :    I have spent a total of 40 minutes with patient reviewing hospital  notes , telemetry, EKGs, labs and examining patient as well as establishing an assessment and plan that was discussed with the patient. > 50% of time was spent in direct patient care.    Vesta Mixer, Montez Hageman., MD, [facility] 09/25/2019, 11:36 AM [address],  [address] Office [phone] Pager [phone]

## 2019-09-25 NOTE — Progress Notes (Signed)
Admit: 09/19/2019 LOS: 5  60M presenting with anasarca, new BiV s/d acute CHF, CKD vs AKI  Subjective:  2.2 liters UOP over 3/29.  Got lasix yesterday and diamox once last night. Oxygen weaned to 2 liters.  States foley catheter was placed after admission for issue of retention. got lasix 60 mg IV this AM with 1.4 liters UOP thus far on 3/30 as charted.  Got lokelma this AM.  Review of systems   States his shortness of breath is much better.  Denies cp Denies n/v  03/29 0701 - 03/30 0700 In: 843 [P.O.:840; I.V.:3] Out: 2200 [Urine:2200]  Filed Weights   09/23/19 0555 09/24/19 0500 09/25/19 0846  Weight: (!) 160.2 kg (!) 157.5 kg (!) 153.7 kg    Scheduled Meds: . acetaminophen  500 mg Oral BID  . allopurinol  100 mg Oral Daily  . Chlorhexidine Gluconate Cloth  6 each Topical Daily  . heparin  5,000 Units Subcutaneous Q8H  . isosorbide-hydrALAZINE  1 tablet Oral BID  . lidocaine  1 patch Transdermal Q24H  . lidocaine (PF)  15 mL Intradermal Once  . metoprolol succinate  25 mg Oral Daily  . sodium chloride flush  3 mL Intravenous Q12H   Continuous Infusions: . sodium chloride     PRN Meds:.sodium chloride, acetaminophen, sodium chloride flush  Current Labs: reviewed    Physical Exam:  Blood pressure 129/60, pulse 90, temperature 98.4 F (36.9 C), temperature source Oral, resp. rate 18, height 6\' 1"  (1.854 m), weight (!) 153.7 kg, SpO2 98 %. Morbidly obese, in bed  Diminished in the bases unlabored at rest Heart S1S2 no rub 1-2+ edema in the legs Neuro alert and oriented x 3; provides hx and follows commands psych normal mood and affect  GU has a foley with urine in place   1. AKI improving.  May have some degree of CKD.  negative renal imaging for obstruction or masses at admission; admit UA without hematuria, pyuria; 1+ proteinuria present.  Baseline creatinine unknown - diuretics as below Ok if one-time-only colchicine needed  2. Acute systolic/diastolic  biventricular CHF exacerbation with hypervolemia/anasarca/ascites.  S/p lasix 40 mg IV TID.  Alkalosis and s/p diamox once on 3/29.  Continue fluid and sodium restriction - s/p Lasix 60 mg IV once earlier today   - weight down from 170.4 kg (first weight available on 3/26) to 153.7 kg on 3/30 3. Urinary retention - per pt report.  Cannot find straight cath or post void documented.  - foley catheter in place.  Defer to primary team.   4. 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 Daily NSAID use prior to admission; recently stopped for pain in knees - would not resume on discharge please   Recent Labs  Lab 09/23/19 0514 09/24/19 0504 09/25/19 0531  NA 146* 141 139  K 4.0 4.4 4.9  CL 95* 91* 92*  CO2 39* 39* 37*  GLUCOSE 103* 117* 140*  BUN 24* 24* 32*  CREATININE 1.66* 1.49* 1.65*  CALCIUM 8.4* 8.8* 8.8*  PHOS 4.9* 3.8 4.8*   Recent Labs  Lab 09/19/19 2100 09/22/19 0556 09/24/19 0504  WBC 5.1 6.6 5.3  HGB 16.0 15.2 13.8  HCT 51.2 49.1 43.6  MCV 91.8 91.4 92.0  PLT 288 228 243      Trannie Bardales C Jaelene Garciagarcia 09/25/2019 2:22 PM

## 2019-09-26 DIAGNOSIS — N289 Disorder of kidney and ureter, unspecified: Secondary | ICD-10-CM

## 2019-09-26 LAB — RENAL FUNCTION PANEL
Albumin: 3.2 g/dL — ABNORMAL LOW (ref 3.5–5.0)
Anion gap: 11 (ref 5–15)
BUN: 42 mg/dL — ABNORMAL HIGH (ref 8–23)
CO2: 35 mmol/L — ABNORMAL HIGH (ref 22–32)
Calcium: 9.3 mg/dL (ref 8.9–10.3)
Chloride: 94 mmol/L — ABNORMAL LOW (ref 98–111)
Creatinine, Ser: 1.61 mg/dL — ABNORMAL HIGH (ref 0.61–1.24)
GFR calc Af Amer: 53 mL/min — ABNORMAL LOW (ref >60)
GFR calc non Af Amer: 45 mL/min — ABNORMAL LOW (ref >60)
Glucose, Bld: 133 mg/dL — ABNORMAL HIGH (ref 70–99)
Phosphorus: 3.9 mg/dL (ref 2.5–4.6)
Potassium: 4.5 mmol/L (ref 3.5–5.1)
Sodium: 140 mmol/L (ref 135–145)

## 2019-09-26 MED ORDER — SODIUM CHLORIDE 0.9 % IV SOLN: 250.0000 mL | INTRAVENOUS | Status: DC | PRN

## 2019-09-26 MED ORDER — FUROSEMIDE 10 MG/ML IJ SOLN
60.0000 mg | Freq: Once | INTRAMUSCULAR | Status: AC
  Administered 2019-09-26: 60 mg via INTRAVENOUS
  Filled 2019-09-26: qty 6

## 2019-09-26 MED ORDER — SODIUM CHLORIDE 0.9 % IV SOLN: INTRAVENOUS | Status: DC

## 2019-09-26 MED ORDER — SODIUM CHLORIDE 0.9% FLUSH
3.0000 mL | Freq: Two times a day (BID) | INTRAVENOUS | Status: DC
  Administered 2019-09-26 – 2019-09-27 (×3): 3 mL via INTRAVENOUS

## 2019-09-26 MED ORDER — SODIUM CHLORIDE 0.9% FLUSH: 3.0000 mL | INTRAVENOUS | Status: DC | PRN

## 2019-09-26 MED ORDER — ASPIRIN 81 MG PO CHEW
81.0000 mg | CHEWABLE_TABLET | ORAL | Status: AC
  Filled 2019-09-26: qty 1

## 2019-09-26 NOTE — Progress Notes (Signed)
PROGRESS NOTE    Jack Chavez  ZDG:[id number] DOB: [birth year] DOA: 09/19/2019 PCP: Patient, No Pcp Per   Brief Narrative: Jack Chavez is a 64 y.o. male with a history of morbid obesity. He presented secondary to shortness of breath on exertion in addition to peripheral edema.  He is found to have biventricular heart failure.  He was started on aggressive diuresis.  Cardiology on board and plan for heart cardiac cath on 4/2.  Nephrology consulted secondary to kidney disease in setting of heart failure.   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  Patient with an EF of 20 to 25% with grade 2 diastolic dysfunction.  Echo was significant for generalized hypokinesis.  Cardiology consulted.  Patiently diuresed with IV Lasix.  Weight on admission of 170.4 kg.  Weight is down 20.4 kg to date.  Urine output over the last 24 hours of 2.8 L.  Patient is overall net -14 L.  Cardiology planning for left and right heart catheterization on 4/2 -Cardiology recommendations: L/R heart cath, BiDil, metoprolol -Lasix per nephrology recommendations as mentioned below  Acute kidney injury. Unknown baseline.  Creatinine of 2.07 on admission.  Peak creatinine of 2.10.  Improving on IV diuresis with Lasix and 1 dose of acetazolamide for developing alkalosis.  Nephrology consulted and is following.  Patient likely has underlying CKD.  Foley was placed secondary to urinary incontinence and was removed on 3/30. -Nephrology recommendations: Lasix IV x1 today, avoid NSAIDs  Acute respiratory failure with hypoxia Secondary to heart failure exacerbation.  Weaning oxygen to room air as able. -Continue  to wean oxygen and manage above problems  Diarrhea Appears resolved.  Elevated bilirubin Right upper quadrant ultrasound was obtained with concern for possible cirrhosis.  Moderate ascites noted as well.  Patient without any abdominal pain.  This is a new diagnosis.  No paracentesis performed to date.  No leukocytosis or confusion.  Essential hypertension Patient started on BiDil and metoprolol -Continue BiDil and metoprolol per cardiology recommendations  Hyperkalemia Likely secondary to kidney injury.  Patient received Lokelma.  Resolved.  Morbid obesity Body mass index is 43.63 kg/m.    DVT prophylaxis: Heparin subcutaneous Code Status:   Code Status: Full Code Family Communication: None at bedside Disposition Plan: Discharge likely home pending continued cardiology management and recommendations   Consultants:   Cardiology  Nephrology  Procedures:   TRANSTHORACIC ECHOCARDIOGRAM (09/20/2019) IMPRESSIONS    1. Severe global reduction in LV systolic function; moderate LVE; grade 2  diastolic dysfunction; biatrial enlargement; severe RV dysfunction; mild  TR with severe pulmonary hypertension.  2. Left ventricular ejection fraction, by estimation, is 20 to 25%. The  left ventricle has severely decreased function. The left ventricle  demonstrates global hypokinesis. The left ventricular internal cavity size  was moderately dilated. Left  ventricular diastolic parameters are consistent with Grade II diastolic  dysfunction (pseudonormalization). Elevated left atrial pressure.  3. Right ventricular systolic function is severely reduced. The right  ventricular size is normal. There is severely elevated pulmonary artery  systolic pressure.  4. Left atrial size was mildly dilated.  5. Right atrial size was moderately dilated.  6. The mitral valve is normal  in structure. Trivial mitral valve  regurgitation. No evidence of mitral stenosis.  7. The aortic valve is  tricuspid. Aortic valve regurgitation is not  visualized. No aortic stenosis is present.  8. The inferior vena cava is dilated in size with <50% respiratory  variability, suggesting right atrial pressure of 15 mmHg.    Antimicrobials:  None   Subjective: Some mild dyspnea. No chest pain.  Objective: Vitals:   09/25/19 1353 09/25/19 2017 09/26/19 0505 09/26/19 1349  BP: 129/60 119/67 140/87 140/82  Pulse: 90 92 (!) 105 88  Resp: 18 18 20 18   Temp: 98.4 F (36.9 C) 98.4 F (36.9 C) 98.9 F (37.2 C) 97.9 F (36.6 C)  TempSrc: Oral Oral Oral Oral  SpO2: 98% 92% 99% 99%  Weight:   (!) 150 kg   Height:        Intake/Output Summary (Last 24 hours) at 09/26/2019 2059 Last data filed at 09/26/2019 1900 Gross per 24 hour  Intake 840 ml  Output 1950 ml  Net -1110 ml   Filed Weights   09/24/19 0500 09/25/19 0846 09/26/19 0505  Weight: (!) 157.5 kg (!) 153.7 kg (!) 150 kg    Examination:  General exam: Appears calm and comfortable Respiratory system: Diminished. Respiratory effort normal. Cardiovascular system: S1 & S2 heard, RRR. No murmurs, rubs, gallops or clicks. Gastrointestinal system: Abdomen is nondistended, soft and nontender. No organomegaly or masses felt. Normal bowel sounds heard. Central nervous system: Alert and oriented. No focal neurological deficits. Extremities: 2+ pitting edema. No calf tenderness Skin: No cyanosis. No rashes Psychiatry: Judgement and insight appear normal. Mood & affect appropriate.     Data Reviewed: I have personally reviewed following labs and imaging studies  CBC: Recent Labs  Lab 09/19/19 2100 09/22/19 0556 09/24/19 0504  WBC 5.1 6.6 5.3  HGB 16.0 15.2 13.8  HCT 51.2 49.1 43.6  MCV 91.8 91.4 92.0  PLT 288 228 243   Basic Metabolic Panel: Recent Labs  Lab 09/22/19 0556 09/23/19 0514 09/24/19 0504 09/25/19 0531 09/26/19 0507  NA 145 146* 141 139 140  K 4.2 4.0 4.4 4.9 4.5  CL 97* 95* 91* 92* 94*  CO2 33* 39*  39* 37* 35*  GLUCOSE 75 103* 117* 140* 133*  BUN 24* 24* 24* 32* 42*  CREATININE 1.81* 1.66* 1.49* 1.65* 1.61*  CALCIUM 8.6* 8.4* 8.8* 8.8* 9.3  MG  --  1.7  --  2.0  --   PHOS 4.5 4.9* 3.8 4.8* 3.9   GFR: Estimated Creatinine Clearance: 73.5 mL/min (A) (by C-G formula based on SCr of 1.61 mg/dL (H)). Liver Function Tests: Recent Labs  Lab 09/19/19 2100 09/20/19 1453 09/23/19 0514 09/24/19 0504 09/24/19 0554 09/25/19 0531 09/26/19 0507  AST 44*  --   --   --  19  --   --   ALT 21  --   --   --  12  --   --   ALKPHOS 66  --   --   --  49  --   --   BILITOT 2.1*  --   --   --  1.1  --   --   PROT 7.3  --   --   --  6.8  --   --   ALBUMIN 3.7   < > 3.1* 2.9* 3.1* 2.9* 3.2*   < > = values in this interval not displayed.   Recent Labs  Lab 09/19/19 2100  LIPASE 36   No  results for input(s): AMMONIA in the last 168 hours. Coagulation Profile: Recent Labs  Lab 09/22/19 0556  INR 1.5*   Cardiac Enzymes: No results for input(s): CKTOTAL, CKMB, CKMBINDEX, TROPONINI in the last 168 hours. BNP (last 3 results) No results for input(s): PROBNP in the last 8760 hours. HbA1C: No results for input(s): HGBA1C in the last 72 hours. CBG: No results for input(s): GLUCAP in the last 168 hours. Lipid Profile: No results for input(s): CHOL, HDL, LDLCALC, TRIG, CHOLHDL, LDLDIRECT in the last 72 hours. Thyroid Function Tests: No results for input(s): TSH, T4TOTAL, FREET4, T3FREE, THYROIDAB in the last 72 hours. Anemia Panel: No results for input(s): VITAMINB12, FOLATE, FERRITIN, TIBC, IRON, RETICCTPCT in the last 72 hours. Sepsis Labs: Recent Labs  Lab 09/20/19 0317  PROCALCITON <0.10    Recent Results (from the past 240 hour(s))  SARS CORONAVIRUS 2 (TAT 6-24 HRS) Nasopharyngeal Nasopharyngeal Swab     Status: None   Collection Time: 09/19/19 11:38 PM   Specimen: Nasopharyngeal Swab  Result Value Ref Range Status   SARS Coronavirus 2 NEGATIVE NEGATIVE Final    Comment:  (NOTE) SARS-CoV-2 target nucleic acids are NOT DETECTED. The SARS-CoV-2 RNA is generally detectable in upper and lower respiratory specimens during the acute phase of infection. Negative results do not preclude SARS-CoV-2 infection, do not rule out co-infections with other pathogens, and should not be used as the sole basis for treatment or other patient management decisions. Negative results must be combined with clinical observations, patient history, and epidemiological information. The expected result is Negative. Fact Sheet for Patients: HairSlick.no Fact Sheet for Healthcare Providers: quierodirigir.com This test is not yet approved or cleared by the [place] FDA and  has been authorized for detection and/or diagnosis of SARS-CoV-2 by FDA under an Emergency Use Authorization (EUA). This EUA will remain  in effect (meaning this test can be used) for the duration of the COVID-19 declaration under Section 56 4(b)(1) of the Act, 21 U.S.C. section 360bbb-3(b)(1), unless the authorization is terminated or revoked sooner. Performed at [facility], [address], [place], [place] [address]   Body fluid culture     Status: None (Preliminary result)   Collection Time: 09/23/19  5:27 PM   Specimen: Synovial, Right Knee; Body Fluid  Result Value Ref Range Status   Specimen Description   Final    JOINT FLUID Performed at [facility], [address] [place], [place] [address]    Special Requests   Final    RIGHT KNEE Performed at [facility], [address] [place], [place] [address]    Gram Stain   Final    WBC PRESENT,BOTH PMN AND MONONUCLEAR NO ORGANISMS SEEN CALLED T.Mina Marble  [id number] @2024  BY V.WILKINS Performed at [facility], [address] [place], [place]    Culture   Final    NO GROWTH 2 DAYS Performed at [facility],  [address] [place], [place]    Report Status PENDING  Incomplete         Radiology Studies: No results found.      Scheduled Meds: . acetaminophen  500 mg Oral BID  . allopurinol  100 mg Oral Daily  . [START ON 09/27/2019] aspirin  81 mg Oral Pre-Cath  . furosemide  60 mg Intravenous Once  . heparin  5,000 Units Subcutaneous Q8H  . isosorbide-hydrALAZINE  1 tablet Oral BID  . lidocaine  1 patch Transdermal  Q24H  . lidocaine (PF)  15 mL Intradermal Once  . metoprolol succinate  25 mg Oral Daily  . sodium chloride flush  3 mL Intravenous Q12H  . sodium chloride flush  3 mL Intravenous Q12H   Continuous Infusions: . sodium chloride    . sodium chloride    . [START ON 09/28/2019] sodium chloride       LOS: 6 days     Cordelia Poche, MD [facility] 09/26/2019, 8:59 PM  If 7PM-7AM, please contact night-coverage [email or web address]

## 2019-09-26 NOTE — Progress Notes (Signed)
Nephrology Progress Note  Admit: 09/19/2019 LOS: 6   Subjective:  2.8 liters UOP over 3/30.  Got lasix 60 mg IV once yesterday.  Spoke with primary team this AM.  We gave lasix 60 mg IV once this AM.  His foley was removed yesterday.  States having a good amount of urine - asking for two urinals to keep up and we called desk.  I emptied 500 mL urine from urinal and notified nurse so that he could go ahead an urinate.  Was short of breath last night with lying flat for a bath.  Has been set up for heart cath on 4/2.  Review of systems   States his shortness of breath is much better.  Still having shortness of breath when lying flat  Denies cp Denies n/v  03/30 0701 - 03/31 0700 In: 600 [P.O.:600] Out: 2825 [Urine:2825]  Filed Weights   09/24/19 0500 09/25/19 0846 09/26/19 0505  Weight: (!) 157.5 kg (!) 153.7 kg (!) 150 kg    Scheduled Meds: . acetaminophen  500 mg Oral BID  . allopurinol  100 mg Oral Daily  . heparin  5,000 Units Subcutaneous Q8H  . isosorbide-hydrALAZINE  1 tablet Oral BID  . lidocaine  1 patch Transdermal Q24H  . lidocaine (PF)  15 mL Intradermal Once  . metoprolol succinate  25 mg Oral Daily  . sodium chloride flush  3 mL Intravenous Q12H   Continuous Infusions: . sodium chloride     PRN Meds:.sodium chloride, acetaminophen, sodium chloride flush  Current Labs: reviewed    Physical Exam:  Blood pressure 140/82, pulse 88, temperature 97.9 F (36.6 C), temperature source Oral, resp. rate 18, height 6\' 1"  (1.854 m), weight (!) 150 kg, SpO2 99 %. Morbidly obese, in bed Clear anteriorly unlabored at rest Heart S1S2 no rub 1-2+ edema in the legs Neuro alert and oriented x 3; provides hx and follows commands psych normal mood and affect  GU no foley; urinal with urine   1. AKI improving.  May have some degree of CKD.  negative renal imaging for obstruction or masses at admission; admit UA without hematuria, pyuria; 1+ proteinuria present.  Baseline  creatinine unknown - diuretics as below - continue supportive care  - Ok if one-time-only colchicine needed  2. Acute systolic/diastolic biventricular CHF exacerbation with hypervolemia/anasarca/ascites.  S/p lasix 40 mg IV TID.  Alkalosis and s/p diamox once on 3/29.  Continue fluid and sodium restriction - s/p Lasix 60 mg IV once earlier 3/31 - repeat x 1 later today (ordered) - weight down from 170.4 kg (first weight available on 3/26) to 150 kg on 3/31 3. Urinary retention - per pt report.  Cannot find straight cath or post void documented.  Foley is out.  Check post-void residual bladder scan and in/out cath if indicated 4. 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 Daily NSAID use prior to admission; recently stopped for pain in knees - would not resume on discharge please   Recent Labs  Lab 09/24/19 0504 09/25/19 0531 09/26/19 0507  NA 141 139 140  K 4.4 4.9 4.5  CL 91* 92* 94*  CO2 39* 37* 35*  GLUCOSE 117* 140* 133*  BUN 24* 32* 42*  CREATININE 1.49* 1.65* 1.61*  CALCIUM 8.8* 8.8* 9.3  PHOS 3.8 4.8* 3.9   Recent Labs  Lab 09/19/19 2100 09/22/19 0556 09/24/19 0504  WBC 5.1 6.6 5.3  HGB 16.0 15.2 13.8  HCT 51.2  49.1 43.6  MCV 91.8 91.4 92.0  PLT 288 228 243      Jack Chavez 09/26/2019 3:12 PM

## 2019-09-26 NOTE — Progress Notes (Signed)
Progress Note  Patient Name: Jack Chavez Date of Encounter: 09/26/2019  Primary Cardiologist: Verne Carrow, MD    Subjective   Is a 64 year old gentleman who presented to the hospital with anasarca, biventricular acute systolic and diastolic congestive heart failure, CKD.  He has diuresed almost 14 L so far during this admission.  Creatinine remains fairly stable at 1.61.  Patient has a history of hypertension.  he has been noncompliant with his medications.    we added bidil to his med list 03/27     Inpatient Medications    Scheduled Meds: . acetaminophen  500 mg Oral BID  . allopurinol  100 mg Oral Daily  . heparin  5,000 Units Subcutaneous Q8H  . isosorbide-hydrALAZINE  1 tablet Oral BID  . lidocaine  1 patch Transdermal Q24H  . lidocaine (PF)  15 mL Intradermal Once  . metoprolol succinate  25 mg Oral Daily  . sodium chloride flush  3 mL Intravenous Q12H   Continuous Infusions: . sodium chloride     PRN Meds: sodium chloride, acetaminophen, sodium chloride flush   Vital Signs    Vitals:   09/25/19 1200 09/25/19 1353 09/25/19 2017 09/26/19 0505  BP:  129/60 119/67 140/87  Pulse:  90 92 (!) 105  Resp:  18 18 20   Temp:  98.4 F (36.9 C) 98.4 F (36.9 C) 98.9 F (37.2 C)  TempSrc:  Oral Oral Oral  SpO2: 94% 98% 92% 99%  Weight:    (!) 150 kg  Height:        Intake/Output Summary (Last 24 hours) at 09/26/2019 0854 Last data filed at 09/26/2019 0843 Gross per 24 hour  Intake 840 ml  Output 2825 ml  Net -1985 ml   Last 3 Weights 09/26/2019 09/25/2019 09/24/2019  Weight (lbs) 330 lb 11 oz 338 lb 13.6 oz 347 lb 3.6 oz  Weight (kg) 150 kg 153.7 kg 157.5 kg      Telemetry    NSR   ECG    None today  - Personally Reviewed  Physical Exam   Physical Exam: Blood pressure 140/87, pulse (!) 105, temperature 98.9 F (37.2 C), temperature source Oral, resp. rate 20, height 6\' 1"  (1.854 m), weight (!) 150 kg, SpO2 99 %.  GEN:   Obese, middle age  male,  NAD  HEENT: Normal NECK: No JVD; No carotid bruits LYMPHATICS: No lymphadenopathy CARDIAC: RRR  RESPIRATORY:  Clear to auscultation without rales, wheezing or rhonchi  ABDOMEN: Soft, non-tender, non-distended MUSCULOSKELETAL:  No edema; No deformity  SKIN: Warm and dry NEUROLOGIC:  Alert and oriented x 3   Labs    High Sensitivity Troponin:  No results for input(s): TROPONINIHS in the last 720 hours.    Chemistry Recent Labs  Lab 09/19/19 2100 09/20/19 0317 09/24/19 0504 09/24/19 0504 09/24/19 0554 09/25/19 0531 09/26/19 0507  NA 139   < > 141  --   --  139 140  K 5.4*   < > 4.4  --   --  4.9 4.5  CL 102   < > 91*  --   --  92* 94*  CO2 27   < > 39*  --   --  37* 35*  GLUCOSE 79   < > 117*  --   --  140* 133*  BUN 26*   < > 24*  --   --  32* 42*  CREATININE 2.07*   < > 1.49*  --   --  1.65* 1.61*  CALCIUM 8.7*   < > 8.8*  --   --  8.8* 9.3  PROT 7.3  --   --   --  6.8  --   --   ALBUMIN 3.7   < > 2.9*   < > 3.1* 2.9* 3.2*  AST 44*  --   --   --  19  --   --   ALT 21  --   --   --  12  --   --   ALKPHOS 66  --   --   --  49  --   --   BILITOT 2.1*  --   --   --  1.1  --   --   GFRNONAA 34*   < > 50*  --   --  44* 45*  GFRAA 39*   < > 58*  --   --  51* 53*  ANIONGAP 10   < > 11  --   --  10 11   < > = values in this interval not displayed.     Hematology Recent Labs  Lab 09/19/19 2100 09/22/19 0556 09/24/19 0504  WBC 5.1 6.6 5.3  RBC 5.58 5.37 4.74  HGB 16.0 15.2 13.8  HCT 51.2 49.1 43.6  MCV 91.8 91.4 92.0  MCH 28.7 28.3 29.1  MCHC 31.3 31.0 31.7  RDW 16.6* 16.3* 15.7*  PLT 288 228 243    BNP Recent Labs  Lab 09/19/19 2100  BNP 1,446.6*    Lab Results  Component Value Date   INR 1.5 (H) 09/22/2019    Radiology    No results found.  Cardiac Studies   ECHO: 03/25/20201 1. Severe global reduction in LV systolic function; moderate LVE; grade 2  diastolic dysfunction; biatrial enlargement; severe RV dysfunction; mild  TR with severe  pulmonary hypertension.  2. Left ventricular ejection fraction, by estimation, is 20 to 25%. The  left ventricle has severely decreased function. The left ventricle  demonstrates global hypokinesis. The left ventricular internal cavity size  was moderately dilated. Left  ventricular diastolic parameters are consistent with Grade II diastolic  dysfunction (pseudonormalization). Elevated left atrial pressure.  3. Right ventricular systolic function is severely reduced. The right  ventricular size is normal. There is severely elevated pulmonary artery  systolic pressure.  4. Left atrial size was mildly dilated.  5. Right atrial size was moderately dilated.  6. The mitral valve is normal in structure. Trivial mitral valve  regurgitation. No evidence of mitral stenosis.  7. The aortic valve is tricuspid. Aortic valve regurgitation is not  visualized. No aortic stenosis is present.  8. The inferior vena cava is dilated in size with <50% respiratory  variability, suggesting right atrial pressure of 15 mmHg.   Patient Profile     64 y.o. male with acute on chronic systolic and diastolic congestive heart failure, acute on  chronic kidney disease  Assessment & Plan    1.  Acute biventricular CHF with severe pulmonary hypertension:   Continues to diurese. Renal function is fairly stable I have set him up for R and L heart cath on Friday   We have discussed the risks, benefits, options of cath.  He understands and agrees to proceed with cath on Friday   2.  Renal Insufficiency:  Further management per nephrology and primary team   3.  Morbid obesity       Mertie Moores, MD  09/26/2019 9:03 AM    [facility] [address]  [address] [place], [place]  [address] Phone: [phone]; Fax: [phone]

## 2019-09-27 DIAGNOSIS — N179 Acute kidney failure, unspecified: Secondary | ICD-10-CM

## 2019-09-27 LAB — RENAL FUNCTION PANEL
Albumin: 3 g/dL — ABNORMAL LOW (ref 3.5–5.0)
Anion gap: 9 (ref 5–15)
BUN: 47 mg/dL — ABNORMAL HIGH (ref 8–23)
CO2: 35 mmol/L — ABNORMAL HIGH (ref 22–32)
Calcium: 8.9 mg/dL (ref 8.9–10.3)
Chloride: 95 mmol/L — ABNORMAL LOW (ref 98–111)
Creatinine, Ser: 1.59 mg/dL — ABNORMAL HIGH (ref 0.61–1.24)
GFR calc Af Amer: 54 mL/min — ABNORMAL LOW (ref >60)
GFR calc non Af Amer: 46 mL/min — ABNORMAL LOW (ref >60)
Glucose, Bld: 117 mg/dL — ABNORMAL HIGH (ref 70–99)
Phosphorus: 4.4 mg/dL (ref 2.5–4.6)
Potassium: 4.4 mmol/L (ref 3.5–5.1)
Sodium: 139 mmol/L (ref 135–145)

## 2019-09-27 LAB — BODY FLUID CULTURE: Culture: NO GROWTH

## 2019-09-27 MED ORDER — DICLOFENAC SODIUM 1 % EX GEL
2.0000 g | Freq: Four times a day (QID) | CUTANEOUS | Status: DC
  Administered 2019-09-27 – 2019-10-03 (×21): 2 g via TOPICAL
  Filled 2019-09-27: qty 100

## 2019-09-27 MED ORDER — POLYETHYLENE GLYCOL 3350 17 G PO PACK
17.0000 g | PACK | Freq: Every day | ORAL | Status: DC
  Administered 2019-09-29 – 2019-10-03 (×5): 17 g via ORAL
  Filled 2019-09-27 (×5): qty 1

## 2019-09-27 MED ORDER — PREDNISONE 20 MG PO TABS
40.0000 mg | ORAL_TABLET | Freq: Every day | ORAL | Status: DC
  Administered 2019-09-28 – 2019-10-02 (×5): 40 mg via ORAL
  Filled 2019-09-27 (×5): qty 2

## 2019-09-27 MED ORDER — FUROSEMIDE 10 MG/ML IJ SOLN
60.0000 mg | Freq: Three times a day (TID) | INTRAMUSCULAR | Status: AC
  Administered 2019-09-27 – 2019-09-28 (×2): 60 mg via INTRAVENOUS
  Filled 2019-09-27 (×2): qty 6

## 2019-09-27 NOTE — Progress Notes (Signed)
Nephrology Progress Note  Admit: 09/19/2019 LOS: 7  Subjective:  Patient had 4.2 liters UOP over 3/31.  Got lasix 60 mg IV x 2 on 3/31.  Has noticed quite a difference in his swelling.  Still does feel swollen. States he had an issue with dye a few years ago but states this was "getting stuck in my system" which he clarifies as "getting stuck in my shoulder joint" not any trouble with his kidneys, not rash or trouble breathing or lip swelling.  Spoke with team  Review of systems    States his shortness of breath is much better but still having shortness of breath when lying flat  Denies cp Denies n/v  03/31 0701 - 04/01 0700 In: 903 [P.O.:900; I.V.:3] Out: 4195 [Urine:4195]  Filed Weights   09/25/19 0846 09/26/19 0505 09/27/19 0658  Weight: (!) 153.7 kg (!) 150 kg (!) 150.8 kg    Scheduled Meds: . acetaminophen  500 mg Oral BID  . allopurinol  100 mg Oral Daily  . aspirin  81 mg Oral Pre-Cath  . heparin  5,000 Units Subcutaneous Q8H  . isosorbide-hydrALAZINE  1 tablet Oral BID  . lidocaine  1 patch Transdermal Q24H  . lidocaine (PF)  15 mL Intradermal Once  . metoprolol succinate  25 mg Oral Daily  . polyethylene glycol  17 g Oral Daily  . sodium chloride flush  3 mL Intravenous Q12H  . sodium chloride flush  3 mL Intravenous Q12H   Continuous Infusions: . sodium chloride    . sodium chloride    . [START ON 09/28/2019] sodium chloride     PRN Meds:.sodium chloride, sodium chloride, acetaminophen, sodium chloride flush, sodium chloride flush  Current Labs: reviewed    Physical Exam:  Blood pressure 125/68, pulse 92, temperature 97.8 F (36.6 C), temperature source Oral, resp. rate 18, height 6\' 1"  (1.854 m), weight (!) 150.8 kg, SpO2 93 %. Morbidly obese, in bed  Clear anteriorly unlabored at rest Heart S1S2 no rub 2+ edema in the legs Neuro alert and oriented x 3; provides hx and follows commands psych normal mood and affect  GU no foley; urinal with  urine   1. AKI improving.  May have some degree of CKD.  negative renal imaging for obstruction or masses at admission; admit UA without hematuria, pyuria; 1+ proteinuria present.  Baseline creatinine unknown - diuretics as below - continue supportive care  2. Acute systolic/diastolic biventricular CHF exacerbation with hypervolemia/anasarca/ascites.  S/p lasix 40 mg IV TID then alkalosis and s/p diamox once on 3/29.  Then responding well to lasix 60 mg IV intermittently  - Lasix 60 mg IV x 2 doses today - hope to ensure able to lie flat for cath otherwise if renal function worsens or if not able to lie flat would consider postponing  - Continue fluid and sodium restriction - weight down from 170.4 kg (first weight available on 3/26) to 150.8 kg on 4/1  3. Gout flare - team giving prednisone x 1.  Colchicine less attractive option right now given renal function with high dose diuretics (which may actually be fueling flare) as well as the impending contrast exposure 4. Urinary retention - per pt report with foley earlier this hospitalization and cannot find earlier pvr documented.  Note bladder scan for 190 on 3/31. Repeat post void residual bladder scan and in/out cath if indicated.  5. Longstanding hypertension, suspect potential etiology of #1 and #2. Improved.  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 Daily NSAID  use prior to admission; recently stopped for pain in knees - would not resume on discharge please   Recent Labs  Lab 09/25/19 0531 09/26/19 0507 09/27/19 0446  NA 139 140 139  K 4.9 4.5 4.4  CL 92* 94* 95*  CO2 37* 35* 35*  GLUCOSE 140* 133* 117*  BUN 32* 42* 47*  CREATININE 1.65* 1.61* 1.59*  CALCIUM 8.8* 9.3 8.9  PHOS 4.8* 3.9 4.4   Recent Labs  Lab 09/22/19 0556 09/24/19 0504  WBC 6.6 5.3  HGB 15.2 13.8  HCT 49.1 43.6  MCV 91.4 92.0  PLT 228 243    Claudia Desanctis 09/27/2019 5:28 PM

## 2019-09-27 NOTE — Progress Notes (Signed)
Jacquelin Hawking, MD paged again regarding the pt's request for a pain patch d/t left elbow pain.

## 2019-09-27 NOTE — Progress Notes (Signed)
Occupational Therapy Treatment Patient Details Name: Jack Chavez MRN: [id number] DOB: [birth year] Today's Date: 09/27/2019    History of present illness 64 year old with past medical history significant for hypertension morbid obesity who presents complaining of worsening shortness of breath on exertion and peripheral edema. Acute dx of Acute exacerbation of CHF and R knee pain   OT comments  Pt with new onset of L elbow pain @ olecranon - most likely olecranon bursitis, making mobility and ADL tasks more difficult. Required +2 Mod A for bed mobility and sit to stand from elevated surface. Unable to transfer to chair this date due to L elbow pain and R knee pain. SpO2 desats to 86 on 2L, increased to 3L during activity and able to maintain above 90. Minimal DOE during session. Recommend rehab at SNF. Will continue to follow acutely. Recommend pt limit pushing through L elbow, complete AROM as tolerated, keep arm supported and use ice to help with pain control. Pt may benefit from use of pain patch.   Follow Up Recommendations  SNF;Supervision/Assistance - 24 hour    Equipment Recommendations  Tub/shower bench    Recommendations for Other Services      Precautions / Restrictions Precautions Precautions: Fall Precaution Comments: monitor O2 stats Restrictions Weight Bearing Restrictions: No       Mobility Bed Mobility Overal bed mobility: Needs Assistance Bed Mobility: Supine to Sit;Sit to Supine Rolling: Mod assist;+2 for physical assistance;+2 for safety/equipment   Supine to sit: Mod assist;+2 for physical assistance;HOB elevated Sit to supine: Mod assist;+2 for physical assistance      Transfers Overall transfer level: Needs assistance Equipment used: Rolling walker (2 wheeled) Transfers: Sit to/from Stand Sit to Stand: +2 physical assistance;Mod assist;From elevated surface         General transfer comment: unable to stand pivot this session due to R knee pani and L  elbow pain when pushing through RW    Balance     Sitting balance-Leahy Scale: Good       Standing balance-Leahy Scale: Poor                             ADL either performed or assessed with clinical judgement   ADL Overall ADL's : Needs assistance/impaired     Grooming: Wash/dry face;Wash/dry hands;Oral care;Set up;Sitting(EOB)   Upper Body Bathing: Moderate assistance Upper Body Bathing Details (indicate cue type and reason): due to increased elbow pain                 Toileting- Clothing Manipulation and Hygiene: Total assistance;Sit to/from stand       Functional mobility during ADLs: Rolling walker;Cueing for safety;+2 for physical assistance;Moderate assistance General ADL Comments: Will benefit from use of AE; requiring increased assistance due to new onset of L elbow pain     Vision       Perception     Praxis      Cognition Arousal/Alertness: Awake/alert Behavior During Therapy: WFL for tasks assessed/performed Overall Cognitive Status: Within Functional Limits for tasks assessed                                          Exercises Exercises: Other exercises Other Exercises Other Exercises: Keep L elbow supported with use ice; may benefit form compression; Do not push through elbow at this time   Shoulder  Instructions       General Comments new onset L elbow pain @ olecranon; very tender to touch; mild swelling; diffculty with flexing elbow.    Pertinent Vitals/ Pain       Pain Assessment: Faces Faces Pain Scale: Hurts even more Pain Location: Rt knee and Lt elbow with weight bearing and movement Pain Descriptors / Indicators: Aching;Discomfort;Sore Pain Intervention(s): Repositioned;Ice applied(ice applied to L elbow)  Home Living                                          Prior Functioning/Environment              Frequency  Min 2X/week        Progress Toward Goals  OT  Goals(current goals can now be found in the care plan section)  Progress towards OT goals: Progressing toward goals  Acute Rehab OT Goals Patient Stated Goal: to get my strength back. less pain OT Goal Formulation: With patient Time For Goal Achievement: 10/06/19 Potential to Achieve Goals: Fair ADL Goals Pt Will Perform Lower Body Dressing: with max assist;sit to/from stand;with adaptive equipment Pt Will Transfer to Toilet: bedside commode;with max assist Pt Will Perform Toileting - Clothing Manipulation and hygiene: with max assist Pt/caregiver will Perform Home Exercise Program: With written HEP provided;Increased strength;Both right and left upper extremity  Plan Discharge plan needs to be updated    Co-evaluation    PT/OT/SLP Co-Evaluation/Treatment: Yes Reason for Co-Treatment: For patient/therapist safety;To address functional/ADL transfers PT goals addressed during session: Mobility/safety with mobility;Balance;Proper use of DME OT goals addressed during session: ADL's and self-care      AM-PAC OT "6 Clicks" Daily Activity     Outcome Measure   Help from another person eating meals?: A Little Help from another person taking care of personal grooming?: A Little Help from another person toileting, which includes using toliet, bedpan, or urinal?: Total Help from another person bathing (including washing, rinsing, drying)?: A Lot Help from another person to put on and taking off regular upper body clothing?: A Lot Help from another person to put on and taking off regular lower body clothing?: Total 6 Click Score: 12    End of Session Equipment Utilized During Treatment: Gait belt;Rolling walker;Oxygen(3L for mobility)  OT Visit Diagnosis: Unsteadiness on feet (R26.81);Other abnormalities of gait and mobility (R26.89);Pain Pain - Right/Left: Left Pain - part of body: (elbow; R knee)   Activity Tolerance Patient tolerated treatment well   Patient Left in bed;with call  bell/phone within reach;with bed alarm set;Other (comment)(bed in chair position)   Nurse Communication Mobility status;Other (comment)(recommendations for L elbow)        Time: 6160-7371 OT Time Calculation (min): 43 min  Charges: OT General Charges $OT Visit: 1 Visit OT Treatments $Self Care/Home Management : 8-22 mins $Therapeutic Activity: 8-22 mins  Maurie Boettcher, OT/L   Acute OT Clinical Specialist [place] Pager [phone] Office [phone]    [facility] 09/27/2019, 4:12 PM

## 2019-09-27 NOTE — Progress Notes (Signed)
Jacquelin Hawking, MD paged regarding the pt's last BM on 3/29. No orders for constipation at this time.

## 2019-09-27 NOTE — Progress Notes (Addendum)
Progress Note  Patient Name: Jack Chavez Date of Encounter: 09/27/2019  Primary Cardiologist: Verne Carrow, MD  Subjective   Breathing OK at rest but still notes orthopnea. Also notes chest discomfort when laying down flat.   Inpatient Medications    Scheduled Meds: . acetaminophen  500 mg Oral BID  . allopurinol  100 mg Oral Daily  . aspirin  81 mg Oral Pre-Cath  . heparin  5,000 Units Subcutaneous Q8H  . isosorbide-hydrALAZINE  1 tablet Oral BID  . lidocaine  1 patch Transdermal Q24H  . lidocaine (PF)  15 mL Intradermal Once  . metoprolol succinate  25 mg Oral Daily  . polyethylene glycol  17 g Oral Daily  . sodium chloride flush  3 mL Intravenous Q12H  . sodium chloride flush  3 mL Intravenous Q12H   Continuous Infusions: . sodium chloride    . sodium chloride    . [START ON 09/28/2019] sodium chloride     PRN Meds: sodium chloride, sodium chloride, acetaminophen, sodium chloride flush, sodium chloride flush   Vital Signs    Vitals:   09/27/19 0658 09/27/19 0849 09/27/19 1000 09/27/19 1337  BP:  (!) 159/84  125/68  Pulse:  84  92  Resp:    18  Temp:    97.8 F (36.6 C)  TempSrc:    Oral  SpO2:  100% 92% 93%  Weight: (!) 150.8 kg     Height:        Intake/Output Summary (Last 24 hours) at 09/27/2019 1420 Last data filed at 09/27/2019 1341 Gross per 24 hour  Intake 573 ml  Output 3495 ml  Net -2922 ml   Last 3 Weights 09/27/2019 09/26/2019 09/25/2019  Weight (lbs) 332 lb 7.3 oz 330 lb 11 oz 338 lb 13.6 oz  Weight (kg) 150.8 kg 150 kg 153.7 kg      Telemetry    Normal sinus rhythm, rates 80's to 90's, with PVCs.  - Personally Reviewed  ECG    No new ECG tracing today. - Personally Reviewed  Physical Exam   GEN: Morbidly obese African-American male in no acute distress.  Neck: Supple. JVD difficult to assess due to body habitus. Cardiac: RRR. No murmurs, gallops, or rubs.  Respiratory: Clear to auscultation bilaterally. GI: Soft, obese, and  non-tender. MS: 2+ pitting edema of bilateral lower extremities. No deformity. Skin: Warm and dry. Neuro:  No focal deficits.  Psych: Normal affect.  Labs    High Sensitivity Troponin:  No results for input(s): TROPONINIHS in the last 720 hours.    Chemistry Recent Labs  Lab 09/24/19 0554 09/24/19 0554 09/25/19 0531 09/26/19 0507 09/27/19 0446  NA  --    < > 139 140 139  K  --    < > 4.9 4.5 4.4  CL  --    < > 92* 94* 95*  CO2  --    < > 37* 35* 35*  GLUCOSE  --    < > 140* 133* 117*  BUN  --    < > 32* 42* 47*  CREATININE  --    < > 1.65* 1.61* 1.59*  CALCIUM  --    < > 8.8* 9.3 8.9  PROT 6.8  --   --   --   --   ALBUMIN 3.1*   < > 2.9* 3.2* 3.0*  AST 19  --   --   --   --   ALT 12  --   --   --   --  ALKPHOS 49  --   --   --   --   BILITOT 1.1  --   --   --   --   GFRNONAA  --    < > 44* 45* 46*  GFRAA  --    < > 51* 53* 54*  ANIONGAP  --    < > 10 11 9    < > = values in this interval not displayed.     Hematology Recent Labs  Lab 09/22/19 0556 09/24/19 0504  WBC 6.6 5.3  RBC 5.37 4.74  HGB 15.2 13.8  HCT 49.1 43.6  MCV 91.4 92.0  MCH 28.3 29.1  MCHC 31.0 31.7  RDW 16.3* 15.7*  PLT 228 243    BNPNo results for input(s): BNP, PROBNP in the last 168 hours.   DDimer No results for input(s): DDIMER in the last 168 hours.   Radiology    No results found.  Cardiac Studies   Echocardiogram 09/20/2019: Impressions: 1. Severe global reduction in LV systolic function; moderate LVE; grade 2  diastolic dysfunction; biatrial enlargement; severe RV dysfunction; mild  TR with severe pulmonary hypertension.  2. Left ventricular ejection fraction, by estimation, is 20 to 25%. The  left ventricle has severely decreased function. The left ventricle  demonstrates global hypokinesis. The left ventricular internal cavity size  was moderately dilated. Left ventricular diastolic parameters are consistent with Grade II diastolic  dysfunction (pseudonormalization).  Elevated left atrial pressure.  3. Right ventricular systolic function is severely reduced. The right  ventricular size is normal. There is severely elevated pulmonary artery  systolic pressure.  4. Left atrial size was mildly dilated.  5. Right atrial size was moderately dilated.  6. The mitral valve is normal in structure. Trivial mitral valve  regurgitation. No evidence of mitral stenosis.  7. The aortic valve is tricuspid. Aortic valve regurgitation is not  visualized. No aortic stenosis is present.  8. The inferior vena cava is dilated in size with <50% respiratory  variability, suggesting right atrial pressure of 15 mmHg.  Patient Profile   Jack Chavez is a 64 y.o. male with a history of hypertension and arthritis who is being seen for evaluation of new onset systolic CHF at the request of Dr. Tyrell Antonio.   Assessment & Plan    New Onset Acute Biventricular CHF with Severe Pulmonary Hypertension  - Patient presented with dyspnea and ascites. - BNP 1,446. - Chest x-ray showed cardiomegaly and layering right pleural effusion with right lower lobe airspace opacity. - Echo showed LVEF of 20-25% with moderate LV enlargement, global hypokinesis, and  grade 2 diastolic dysfunction. Also noted to have severe RV dysfunction with mild TR and severe pulmonary hypertension. - Diuresing well on IV Lasix. Documented urinary output of 4.195 L in the past 24 hours and net negative 17 L since admission. Weight up 2 lbs since yesterday but down 43 lbs since admission. Renal function stable.  - Continue diuresis per Nephrology.  - Continue BiDil 20-37.5mg  twice daily and Toprol-XL 25mg  daily. - No ACEi/ARB/Entresto given renal function. - Plan is for right/left heart catheterization tomorrow.   Hypertension  - BP well controlled. - Continue current medications.   AKI - Creatinine stable at 1.59 today. 2.07 on admission. Baseline unknown.  - Nephrology following.    Hyperkalemia - Resolved  with Lokelma. Stable at 4.4 today.  Morbid Obesity - BMI 43.86.  - Weight loss will be important.  - Needs sleep study as outpatient.   Otherwise,  per primary team.   For questions or updates, please contact [facility] Please consult [email or web address] for contact info under        Signed, Corrin Parker, PA-C  09/27/2019, 2:20 PM    Attending Note:   The patient was seen and examined.  Agree with assessment and plan as noted above.  Changes made to the above note as needed.  Patient seen and independently examined with  Marjie Skiff, PA .   We discussed all aspects of the encounter. I agree with the assessment and plan as stated above.  1.   CHF :  Has diuresed well Scheduled for R and L heart cath tomorrow. We have discussed the risks, benefits, options.   He understands and agrees to proceed.   2.  HTN:   Well controlled at this poin t  3.  AKI:  Stable for now     I have spent a total of 40 minutes with patient reviewing hospital  notes , telemetry, EKGs, labs and examining patient as well as establishing an assessment and plan that was discussed with the patient. > 50% of time was spent in direct patient care.    Vesta Mixer, Montez Hageman., MD, [facility] 09/27/2019, 3:18 PM [address]  [address] Office [phone] Pager [phone]

## 2019-09-27 NOTE — Progress Notes (Signed)
Pt began to desat to mid 80's on room air. 1L of O2 applied. Pt SpO2 at 93%.

## 2019-09-27 NOTE — Progress Notes (Signed)
Jacquelin Hawking, MD paged regarding pt's c/o left elbow pain and request for a pain patch.

## 2019-09-27 NOTE — Progress Notes (Signed)
Physical Therapy Treatment Patient Details Name: Jack Chavez MRN: [id number] DOB: May 21, 1956 Today's Date: 09/27/2019    History of Present Illness 64 year old with past medical history significant for hypertension morbid obesity who presents complaining of worsening shortness of breath on exertion and peripheral edema. Acute dx of Acute exacerbation of CHF and R knee pain    PT Comments    Patient continues to progress slowly with therapy and remains be limited in therapy by Rt knee pain and today by new onset of Lt elbow. Patient requires mod assist +2 to complete bed mobility and cues to prevent pressure on Lt elbow with return from sit to supine. Pt was able to complete sit<>stand with RW and maintain standing for ~8 minutes. Pt was limited to small steps at EOB due to decreased ability to weight bear in Lt UE on RW and pain with Rt knee movement. Pt O2 increased to 3L/min during activity as he desaturated to 86% on 1.5L/min at start of session. SpO2 90% or greater on 3L with activity. Pt educated on exercise for Rt knee mobility and quad strength after returning to bed and educated on benefits of motion for joint health. He will continue to benefit from skilled PT interventions in acute setting and from follow up at SNF level. Acute PT will continue to progress as able.   Follow Up Recommendations  SNF(CIR declined)     Equipment Recommendations  None recommended by PT(TBD at facility)    Recommendations for Other Services OT consult     Precautions / Restrictions Precautions Precautions: Fall Precaution Comments: monitor O2 stats Restrictions Weight Bearing Restrictions: No    Mobility  Bed Mobility Overal bed mobility: Needs Assistance Bed Mobility: Supine to Sit;Sit to Supine Rolling: Mod assist;+2 for physical assistance;+2 for safety/equipment   Supine to sit: Mod assist;HOB elevated;+2 for physical assistance Sit to supine: Mod assist;HOB elevated;+2 for physical  assistance   General bed mobility comments: pt required cues for reaching Rt UE to bed rail and to walk LE's to EOB. Assist to bring LE's off EOB fully and to raise trunk upright due to pain in Lt elbow. verbal cues requried for safe reach back and assist to bring Rt UE back to sit on EOB at EOS.  Transfers Overall transfer level: Needs assistance Equipment used: Rolling walker (2 wheeled) Transfers: Sit to/from Stand Sit to Stand: +2 physical assistance;+2 safety/equipment;From elevated surface;Mod assist         General transfer comment: Pt requires elevated EOB to initaite power up. Cues required for hand placement/technique and mod assist +2 for power up and to steady with rise.   Ambulation/Gait Ambulation/Gait assistance: Min assist;+2 physical assistance;+2 safety/equipment   Assistive device: Rolling walker (2 wheeled) Gait Pattern/deviations: Step-to pattern;Trunk flexed;Antalgic     General Gait Details: pt took small side steps at EOB with RW, pt limited by Lt elbow pain with weight bearing on RW and Rt knee pain with movements.    Stairs          Wheelchair Mobility    Modified Rankin (Stroke Patients Only)       Balance   Sitting-balance support: Feet supported;Single extremity supported Sitting balance-Leahy Scale: Fair       Standing balance-Leahy Scale: Poor Standing balance comment: reliant on B UE support            Cognition Arousal/Alertness: Awake/alert Behavior During Therapy: WFL for tasks assessed/performed Overall Cognitive Status: Within Functional Limits for tasks assessed  Exercises Other Exercises  Other Exercises: 5reps SAQ on Rt LE in supine/chair position (cues required to press knee into bed to facilitate quad activation)    General Comments General comments (skin integrity, edema, etc.): pt with new Lt elbow pain around olecranon, mild edem anoted and pt reporting tenderness to palpation and diffiuculty  extensing elbow.      Pertinent Vitals/Pain Pain Assessment: Faces Faces Pain Scale: Hurts even more Pain Location: Rt knee and Lt elbow with weight bearing and movement Pain Descriptors / Indicators: Aching;Discomfort;Sore Pain Intervention(s): Limited activity within patient's tolerance;Monitored during session;Repositioned           PT Goals (current goals can now be found in the care plan section) Acute Rehab PT Goals Patient Stated Goal: to get my strength back. less pain PT Goal Formulation: With patient Time For Goal Achievement: 10/07/19 Potential to Achieve Goals: Good    Frequency    Min 3X/week      PT Plan Current plan remains appropriate    Co-evaluation PT/OT/SLP Co-Evaluation/Treatment: Yes Reason for Co-Treatment: For patient/therapist safety;To address functional/ADL transfers PT goals addressed during session: Mobility/safety with mobility;Balance OT goals addressed during session: ADL's and self-care      AM-PAC PT "6 Clicks" Mobility   Outcome Measure  Help needed turning from your back to your side while in a flat bed without using bedrails?: A Lot Help needed moving from lying on your back to sitting on the side of a flat bed without using bedrails?: A Lot Help needed moving to and from a bed to a chair (including a wheelchair)?: A Lot Help needed standing up from a chair using your arms (e.g., wheelchair or bedside chair)?: A Lot Help needed to walk in hospital room?: Total Help needed climbing 3-5 steps with a railing? : Total 6 Click Score: 10    End of Session Equipment Utilized During Treatment: Gait belt;Oxygen Activity Tolerance: Patient limited by fatigue;Patient limited by pain Patient left: in bed;with call bell/phone within reach(with OT in room) Nurse Communication: Mobility status PT Visit Diagnosis: Muscle weakness (generalized) (M62.81);Pain;Other abnormalities of gait and mobility (R26.89) Pain - Right/Left: Right Pain -  part of body: Knee;Arm     Time: 7371-0626 PT Time Calculation (min) (ACUTE ONLY): 34 min  Charges:  $Therapeutic Activity: 8-22 mins                     Verner Mould, DPT Physical Therapist with [facility] [phone]  09/27/2019 6:09 PM

## 2019-09-27 NOTE — Progress Notes (Signed)
PROGRESS NOTE    Lecil Tapp  TOI:[id number] DOB: January 01, 1956 DOA: 09/19/2019 PCP: Patient, No Pcp Per   Brief Narrative: Calder Oblinger is a 64 y.o. male with a history of morbid obesity. He presented secondary to shortness of breath on exertion in addition to peripheral edema.  He is found to have biventricular heart failure.  He was started on aggressive diuresis.  Cardiology on board and plan for heart cardiac cath on 4/2.  Nephrology consulted secondary to kidney disease in setting of heart failure.   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  Patient with an EF of 20 to 25% with grade 2 diastolic dysfunction.  Echo was significant for generalized hypokinesis.  Cardiology consulted.  Patiently diuresed with IV Lasix.  Weight on admission of 170.4 kg.  Weight is down 19.6 kg to date.  Urine output over the last 24 hours of 4.2 L.  Patient is overall net -17 L.  Cardiology planning for left and right heart catheterization on 4/2 -Cardiology recommendations: L/R heart cath, BiDil, metoprolol -Lasix per nephrology recommendations as mentioned below  Acute kidney injury. Unknown baseline.  Creatinine of 2.07 on admission.  Peak creatinine of 2.10.  Improving on IV diuresis with Lasix and 1 dose of acetazolamide for developing alkalosis.  Nephrology consulted and is following.  Patient likely has underlying CKD.  Foley was placed secondary to urinary incontinence and was removed on 3/30. -Nephrology recommendations: pending today, avoid NSAIDs  Acute respiratory failure with hypoxia Secondary to heart failure exacerbation.  Weaning oxygen to room air as able. -Continue to  wean oxygen and manage above problems  Diarrhea Appears resolved.  Elevated bilirubin Right upper quadrant ultrasound was obtained with concern for possible cirrhosis.  Moderate ascites noted as well.  Patient without any abdominal pain.  This is a new diagnosis.  No paracentesis performed to date.  No leukocytosis or confusion. Hyperbilirubinemia resolved.  Essential hypertension Patient started on BiDil and metoprolol -Continue BiDil and metoprolol per cardiology recommendations  Hyperkalemia Likely secondary to kidney injury.  Patient received Lokelma.  Resolved.  Morbid obesity Body mass index is 43.86 kg/m.    DVT prophylaxis: Heparin subcutaneous Code Status:   Code Status: Full Code Family Communication: None at bedside Disposition Plan: Discharge likely home pending continued cardiology management and recommendations   Consultants:   Cardiology  Nephrology  Procedures:   TRANSTHORACIC ECHOCARDIOGRAM (09/20/2019) IMPRESSIONS    1. Severe global reduction in LV systolic function; moderate LVE; grade 2  diastolic dysfunction; biatrial enlargement; severe RV dysfunction; mild  TR with severe pulmonary hypertension.  2. Left ventricular ejection fraction, by estimation, is 20 to 25%. The  left ventricle has severely decreased function. The left ventricle  demonstrates global hypokinesis. The left ventricular internal cavity size  was moderately dilated. Left  ventricular diastolic parameters are consistent with Grade II diastolic  dysfunction (pseudonormalization). Elevated left atrial pressure.  3. Right ventricular systolic function is severely reduced. The right  ventricular size is normal. There is severely elevated pulmonary artery  systolic pressure.  4. Left atrial size was mildly dilated.  5. Right atrial size was moderately dilated.  6. The mitral valve is normal  in structure. Trivial mitral valve  regurgitation. No evidence of mitral stenosis.    7. The aortic valve is tricuspid. Aortic valve regurgitation is not  visualized. No aortic stenosis is present.  8. The inferior vena cava is dilated in size with <50% respiratory  variability, suggesting right atrial pressure of 15 mmHg.    Antimicrobials:  None   Subjective: No dyspnea. No chest pain.  Objective: Vitals:   09/27/19 0405 09/27/19 0658 09/27/19 0849 09/27/19 1000  BP: (!) 141/82  (!) 159/84   Pulse: 85  84   Resp:      Temp: 98.9 F (37.2 C)     TempSrc: Oral     SpO2: 98%  100% 92%  Weight:  (!) 150.8 kg    Height:        Intake/Output Summary (Last 24 hours) at 09/27/2019 1033 Last data filed at 09/27/2019 1000 Gross per 24 hour  Intake 573 ml  Output 4445 ml  Net -3872 ml   Filed Weights   09/25/19 0846 09/26/19 0505 09/27/19 0658  Weight: (!) 153.7 kg (!) 150 kg (!) 150.8 kg    Examination:  General exam: Appears calm and comfortable Respiratory system: Clear to anterior auscultation. Respiratory effort normal. Cardiovascular system: S1 & S2 heard, RRR. No murmurs, rubs, gallops or clicks. Gastrointestinal system: Abdomen is distended, soft and nontender. Decreased bowel sounds heard. Central nervous system: Alert and oriented. No focal neurological deficits. Extremities: 2+ LE edema. No calf tenderness Skin: No cyanosis. No rashes Psychiatry: Judgement and insight appear normal. Mood & affect appropriate.     Data Reviewed: I have personally reviewed following labs and imaging studies  CBC: Recent Labs  Lab 09/22/19 0556 09/24/19 0504  WBC 6.6 5.3  HGB 15.2 13.8  HCT 49.1 43.6  MCV 91.4 92.0  PLT 228 243   Basic Metabolic Panel: Recent Labs  Lab 09/23/19 0514 09/24/19 0504 09/25/19 0531 09/26/19 0507 09/27/19 0446  NA 146* 141 139 140 139  K 4.0 4.4 4.9 4.5 4.4  CL 95* 91* 92* 94* 95*  CO2 39* 39* 37* 35* 35*  GLUCOSE 103* 117* 140* 133* 117*  BUN 24* 24* 32* 42* 47*  CREATININE 1.66* 1.49* 1.65* 1.61* 1.59*   CALCIUM 8.4* 8.8* 8.8* 9.3 8.9  MG 1.7  --  2.0  --   --   PHOS 4.9* 3.8 4.8* 3.9 4.4   GFR: Estimated Creatinine Clearance: 74.7 mL/min (A) (by C-G formula based on SCr of 1.59 mg/dL (H)). Liver Function Tests: Recent Labs  Lab 09/24/19 0504 09/24/19 0554 09/25/19 0531 09/26/19 0507 09/27/19 0446  AST  --  19  --   --   --   ALT  --  12  --   --   --   ALKPHOS  --  49  --   --   --   BILITOT  --  1.1  --   --   --   PROT  --  6.8  --   --   --   ALBUMIN 2.9* 3.1* 2.9* 3.2* 3.0*   No results for input(s): LIPASE, AMYLASE in the last 168 hours. No results for input(s): AMMONIA in the last 168 hours. Coagulation Profile: Recent Labs  Lab 09/22/19 0556  INR 1.5*   Cardiac Enzymes: No results for input(s): CKTOTAL, CKMB, CKMBINDEX, TROPONINI in the last 168 hours. BNP (last 3 results) No results for input(s): PROBNP in the last 8760 hours. HbA1C: No results for input(s): HGBA1C  in the last 72 hours. CBG: No results for input(s): GLUCAP in the last 168 hours. Lipid Profile: No results for input(s): CHOL, HDL, LDLCALC, TRIG, CHOLHDL, LDLDIRECT in the last 72 hours. Thyroid Function Tests: No results for input(s): TSH, T4TOTAL, FREET4, T3FREE, THYROIDAB in the last 72 hours. Anemia Panel: No results for input(s): VITAMINB12, FOLATE, FERRITIN, TIBC, IRON, RETICCTPCT in the last 72 hours. Sepsis Labs: No results for input(s): PROCALCITON, LATICACIDVEN in the last 168 hours.  Recent Results (from the past 240 hour(s))  SARS CORONAVIRUS 2 (TAT 6-24 HRS) Nasopharyngeal Nasopharyngeal Swab     Status: None   Collection Time: 09/19/19 11:38 PM   Specimen: Nasopharyngeal Swab  Result Value Ref Range Status   SARS Coronavirus 2 NEGATIVE NEGATIVE Final    Comment: (NOTE) SARS-CoV-2 target nucleic acids are NOT DETECTED. The SARS-CoV-2 RNA is generally detectable in upper and lower respiratory specimens during the acute phase of infection. Negative results do not preclude  SARS-CoV-2 infection, do not rule out co-infections with other pathogens, and should not be used as the sole basis for treatment or other patient management decisions. Negative results must be combined with clinical observations, patient history, and epidemiological information. The expected result is Negative. Fact Sheet for Patients: SugarRoll.be Fact Sheet for Healthcare Providers: https://www.woods-mathews.com/ This test is not yet approved or cleared by the [place] FDA and  has been authorized for detection and/or diagnosis of SARS-CoV-2 by FDA under an Emergency Use Authorization (EUA). This EUA will remain  in effect (meaning this test can be used) for the duration of the COVID-19 declaration under Section 56 4(b)(1) of the Act, 21 U.S.C. section 360bbb-3(b)(1), unless the authorization is terminated or revoked sooner. Performed at [facility], [place] [address], [place], [place] [address]   Body fluid culture     Status: None   Collection Time: 09/23/19  5:27 PM   Specimen: Synovial, Right Knee; Body Fluid  Result Value Ref Range Status   Specimen Description   Final    JOINT FLUID Performed at [facility] [address] [place], [place] [address]    Special Requests   Final    RIGHT KNEE Performed at [facility] [address], [place], [place] [address]    Gram Stain   Final    WBC PRESENT,BOTH PMN AND MONONUCLEAR NO ORGANISMS SEEN CALLED T.Delmar Landau  [id number] @2024  BY V.WILKINS Performed at [facility], [place] [address] [place], [place] [address]    Culture   Final    NO GROWTH 3 DAYS Performed at [facility], [place] [address] [place], [place] [address]    Report Status 09/27/2019 FINAL  Final         Radiology Studies: No results found.      Scheduled Meds: . acetaminophen  500 mg Oral BID  . allopurinol  100 mg Oral Daily  .  aspirin  81 mg Oral Pre-Cath  . heparin  5,000 Units Subcutaneous Q8H  . isosorbide-hydrALAZINE  1 tablet Oral BID  . lidocaine  1 patch Transdermal Q24H  . lidocaine (PF)  15 mL Intradermal Once  . metoprolol succinate  25 mg Oral Daily  . sodium chloride flush  3 mL Intravenous Q12H  . sodium chloride flush  3 mL Intravenous Q12H   Continuous Infusions: . sodium chloride    . sodium chloride    . [START ON 09/28/2019] sodium chloride       LOS: 7  days     Jacquelin Hawking, MD [facility] 09/27/2019, 10:33 AM  If 7PM-7AM, please contact night-coverage [email or web address]

## 2019-09-28 ENCOUNTER — Encounter (HOSPITAL_COMMUNITY)
Admission: EM | Disposition: A | Discharge status: Skilled Nursing Facility | Payer: Self-pay | Source: Home / Self Care | Attending: Family Medicine

## 2019-09-28 HISTORY — PX: RIGHT/LEFT HEART CATH AND CORONARY ANGIOGRAPHY: CATH118266

## 2019-09-28 LAB — POCT I-STAT EG7
Acid-Base Excess: 11 mmol/L — ABNORMAL HIGH (ref 0.0–2.0)
Acid-Base Excess: 11 mmol/L — ABNORMAL HIGH (ref 0.0–2.0)
Bicarbonate: 40.4 mmol/L — ABNORMAL HIGH (ref 20.0–28.0)
Bicarbonate: 40.6 mmol/L — ABNORMAL HIGH (ref 20.0–28.0)
Calcium, Ion: 1.15 mmol/L (ref 1.15–1.40)
Calcium, Ion: 1.21 mmol/L (ref 1.15–1.40)
HCT: 47 % (ref 39.0–52.0)
HCT: 48 % (ref 39.0–52.0)
Hemoglobin: 16 g/dL (ref 13.0–17.0)
Hemoglobin: 16.3 g/dL (ref 13.0–17.0)
O2 Saturation: 74 %
O2 Saturation: 99 %
Potassium: 4.6 mmol/L (ref 3.5–5.1)
Potassium: 4.7 mmol/L (ref 3.5–5.1)
Sodium: 139 mmol/L (ref 135–145)
Sodium: 141 mmol/L (ref 135–145)
TCO2: 43 mmol/L — ABNORMAL HIGH (ref 22–32)
TCO2: 43 mmol/L — ABNORMAL HIGH (ref 22–32)
pCO2, Ven: 71.5 mmHg (ref 44.0–60.0)
pCO2, Ven: 72.8 mmHg (ref 44.0–60.0)
pH, Ven: 7.352 (ref 7.250–7.430)
pH, Ven: 7.362 (ref 7.250–7.430)
pO2, Ven: 152 mmHg — ABNORMAL HIGH (ref 32.0–45.0)
pO2, Ven: 44 mmHg (ref 32.0–45.0)

## 2019-09-28 LAB — RENAL FUNCTION PANEL
Albumin: 3.4 g/dL — ABNORMAL LOW (ref 3.5–5.0)
Anion gap: 10 (ref 5–15)
BUN: 54 mg/dL — ABNORMAL HIGH (ref 8–23)
CO2: 35 mmol/L — ABNORMAL HIGH (ref 22–32)
Calcium: 9.1 mg/dL (ref 8.9–10.3)
Chloride: 96 mmol/L — ABNORMAL LOW (ref 98–111)
Creatinine, Ser: 1.6 mg/dL — ABNORMAL HIGH (ref 0.61–1.24)
GFR calc Af Amer: 53 mL/min — ABNORMAL LOW (ref >60)
GFR calc non Af Amer: 46 mL/min — ABNORMAL LOW (ref >60)
Glucose, Bld: 140 mg/dL — ABNORMAL HIGH (ref 70–99)
Phosphorus: 3.7 mg/dL (ref 2.5–4.6)
Potassium: 4.5 mmol/L (ref 3.5–5.1)
Sodium: 141 mmol/L (ref 135–145)

## 2019-09-28 SURGERY — RIGHT/LEFT HEART CATH AND CORONARY ANGIOGRAPHY
Anesthesia: LOCAL

## 2019-09-28 MED ORDER — LIDOCAINE HCL (PF) 1 % IJ SOLN
INTRAMUSCULAR | Status: AC
  Filled 2019-09-28: qty 30

## 2019-09-28 MED ORDER — LIDOCAINE HCL (PF) 1 % IJ SOLN
INTRAMUSCULAR | Status: DC | PRN
  Administered 2019-09-28: 3 mL
  Administered 2019-09-28: 2 mL

## 2019-09-28 MED ORDER — FENTANYL CITRATE (PF) 100 MCG/2ML IJ SOLN
INTRAMUSCULAR | Status: AC
  Filled 2019-09-28: qty 2

## 2019-09-28 MED ORDER — HEPARIN (PORCINE) IN NACL 1000-0.9 UT/500ML-% IV SOLN
INTRAVENOUS | Status: AC
  Filled 2019-09-28: qty 500

## 2019-09-28 MED ORDER — HYDRALAZINE HCL 20 MG/ML IJ SOLN
INTRAMUSCULAR | Status: AC
  Filled 2019-09-28: qty 1

## 2019-09-28 MED ORDER — VERAPAMIL HCL 2.5 MG/ML IV SOLN
INTRAVENOUS | Status: AC
  Filled 2019-09-28: qty 2

## 2019-09-28 MED ORDER — HEPARIN (PORCINE) IN NACL 1000-0.9 UT/500ML-% IV SOLN
INTRAVENOUS | Status: DC | PRN
  Administered 2019-09-28 (×2): 500 mL

## 2019-09-28 MED ORDER — HEPARIN SODIUM (PORCINE) 1000 UNIT/ML IJ SOLN
INTRAMUSCULAR | Status: AC
  Filled 2019-09-28: qty 1

## 2019-09-28 MED ORDER — HEPARIN SODIUM (PORCINE) 1000 UNIT/ML IJ SOLN
INTRAMUSCULAR | Status: DC | PRN
  Administered 2019-09-28: 6000 [IU] via INTRAVENOUS

## 2019-09-28 MED ORDER — MIDAZOLAM HCL 2 MG/2ML IJ SOLN
INTRAMUSCULAR | Status: AC
  Filled 2019-09-28: qty 2

## 2019-09-28 MED ORDER — MIDAZOLAM HCL 2 MG/2ML IJ SOLN
INTRAMUSCULAR | Status: DC | PRN
  Administered 2019-09-28: 1 mg via INTRAVENOUS

## 2019-09-28 MED ORDER — FENTANYL CITRATE (PF) 100 MCG/2ML IJ SOLN
INTRAMUSCULAR | Status: DC | PRN
  Administered 2019-09-28: 50 ug via INTRAVENOUS

## 2019-09-28 MED ORDER — ASPIRIN 81 MG PO CHEW
81.0000 mg | CHEWABLE_TABLET | ORAL | Status: AC
  Administered 2019-09-28: 06:00:00 81 mg via ORAL

## 2019-09-28 MED ORDER — FUROSEMIDE 10 MG/ML IJ SOLN
60.0000 mg | Freq: Once | INTRAMUSCULAR | Status: AC
  Administered 2019-09-28: 19:00:00 60 mg via INTRAVENOUS
  Filled 2019-09-28: qty 6

## 2019-09-28 MED ORDER — SODIUM CHLORIDE 0.9 % IV SOLN: 250.0000 mL | INTRAVENOUS | Status: DC | PRN

## 2019-09-28 MED ORDER — IOHEXOL 350 MG/ML SOLN
INTRAVENOUS | Status: DC | PRN
  Administered 2019-09-28: 13:00:00 40 mL

## 2019-09-28 MED ORDER — HYDRALAZINE HCL 20 MG/ML IJ SOLN
10.0000 mg | INTRAMUSCULAR | Status: AC | PRN
  Administered 2019-09-28 (×2): 10 mg via INTRAVENOUS

## 2019-09-28 MED ORDER — VERAPAMIL HCL 2.5 MG/ML IV SOLN
INTRAVENOUS | Status: DC | PRN
  Administered 2019-09-28: 13:00:00 10 mL via INTRA_ARTERIAL

## 2019-09-28 SURGICAL SUPPLY — 14 items
CATH 5FR JL3.5 JR4 ANG PIG MP (CATHETERS) ×1 IMPLANT
CATH BALLN WEDGE 5F 110CM (CATHETERS) ×1 IMPLANT
DEVICE RAD COMP TR BAND LRG (VASCULAR PRODUCTS) ×1 IMPLANT
GLIDESHEATH SLEND SS 6F .021 (SHEATH) ×1 IMPLANT
GUIDEWIRE .025 260CM (WIRE) ×1 IMPLANT
GUIDEWIRE INQWIRE 1.5J.035X260 (WIRE) IMPLANT
HOVERMATT SINGLE USE (MISCELLANEOUS) ×1 IMPLANT
INQWIRE 1.5J .035X260CM (WIRE) ×2
KIT HEART LEFT (KITS) ×2 IMPLANT
PACK CARDIAC CATHETERIZATION (CUSTOM PROCEDURE TRAY) ×2 IMPLANT
SHEATH GLIDE SLENDER 4/5FR (SHEATH) ×1 IMPLANT
SHEATH PROBE COVER 6X72 (BAG) ×1 IMPLANT
TRANSDUCER W/STOPCOCK (MISCELLANEOUS) ×2 IMPLANT
TUBING CIL FLEX 10 FLL-RA (TUBING) ×2 IMPLANT

## 2019-09-28 NOTE — H&P (View-Only) (Signed)
 Progress Note  Patient Name: Jack Chavez Date of Encounter: 09/28/2019  Primary Cardiologist: Christopher McAlhany, MD  Subjective   Doing well He has diuresed 16-1/2 L so far during this admission.  .  Inpatient Medications    Scheduled Meds: . acetaminophen  500 mg Oral BID  . allopurinol  100 mg Oral Daily  . diclofenac Sodium  2 g Topical QID  . heparin  5,000 Units Subcutaneous Q8H  . isosorbide-hydrALAZINE  1 tablet Oral BID  . lidocaine  1 patch Transdermal Q24H  . lidocaine (PF)  15 mL Intradermal Once  . metoprolol succinate  25 mg Oral Daily  . polyethylene glycol  17 g Oral Daily  . predniSONE  40 mg Oral Q breakfast  . sodium chloride flush  3 mL Intravenous Q12H  . sodium chloride flush  3 mL Intravenous Q12H   Continuous Infusions: . sodium chloride    . sodium chloride    . sodium chloride 75 mL/hr at 09/28/19 0621   PRN Meds: sodium chloride, sodium chloride, acetaminophen, sodium chloride flush, sodium chloride flush   Vital Signs    Vitals:   09/27/19 1000 09/27/19 1337 09/27/19 2058 09/28/19 0457  BP:  125/68 (!) 144/81 (!) 162/96  Pulse:  92 90 88  Resp:  18 18   Temp:  97.8 F (36.6 C) 98.7 F (37.1 C) 98 F (36.7 C)  TempSrc:  Oral Oral   SpO2: 92% 93% 95% 97%  Weight:      Height:        Intake/Output Summary (Last 24 hours) at 09/28/2019 0806 Last data filed at 09/28/2019 0644 Gross per 24 hour  Intake 330 ml  Output 3000 ml  Net -2670 ml   Last 3 Weights 09/27/2019 09/26/2019 09/25/2019  Weight (lbs) 332 lb 7.3 oz 330 lb 11 oz 338 lb 13.6 oz  Weight (kg) 150.8 kg 150 kg 153.7 kg      Telemetry    Normal sinus rhythm, rates 80's to 90's, with PVCs.  - Personally Reviewed  ECG    No new ECG tracing today. - Personally Reviewed  Physical Exam   Physical Exam: Blood pressure (!) 162/96, pulse 88, temperature 98 F (36.7 C), resp. rate 18, height 6' 1" (1.854 m), weight (!) 150.8 kg, SpO2 97 %.  GEN:   Obese male,  NAD    HEENT: Normal NECK: No JVD; No carotid bruits LYMPHATICS: No lymphadenopathy CARDIAC: RRR , no murmurs, rubs, gallops RESPIRATORY:  Clear to auscultation without rales, wheezing or rhonchi  ABDOMEN: Soft, non-tender, non-distended MUSCULOSKELETAL:  Edema is significantly improved.  SKIN: Warm and dry NEUROLOGIC:  Alert and oriented x 3   Labs    High Sensitivity Troponin:  No results for input(s): TROPONINIHS in the last 720 hours.    Chemistry Recent Labs  Lab 09/24/19 0554 09/25/19 0531 09/26/19 0507 09/27/19 0446 09/28/19 0447  NA  --    < > 140 139 141  K  --    < > 4.5 4.4 4.5  CL  --    < > 94* 95* 96*  CO2  --    < > 35* 35* 35*  GLUCOSE  --    < > 133* 117* 140*  BUN  --    < > 42* 47* 54*  CREATININE  --    < > 1.61* 1.59* 1.60*  CALCIUM  --    < > 9.3 8.9 9.1  PROT 6.8  --   --   --   --     ALBUMIN 3.1*   < > 3.2* 3.0* 3.4*  AST 19  --   --   --   --   ALT 12  --   --   --   --   ALKPHOS 49  --   --   --   --   BILITOT 1.1  --   --   --   --   GFRNONAA  --    < > 45* 46* 46*  GFRAA  --    < > 53* 54* 53*  ANIONGAP  --    < > 11 9 10    < > = values in this interval not displayed.     Hematology Recent Labs  Lab 09/22/19 0556 09/24/19 0504  WBC 6.6 5.3  RBC 5.37 4.74  HGB 15.2 13.8  HCT 49.1 43.6  MCV 91.4 92.0  MCH 28.3 29.1  MCHC 31.0 31.7  RDW 16.3* 15.7*  PLT 228 243    BNPNo results for input(s): BNP, PROBNP in the last 168 hours.   DDimer No results for input(s): DDIMER in the last 168 hours.   Radiology    No results found.  Cardiac Studies   Echocardiogram 09/20/2019: Impressions: 1. Severe global reduction in LV systolic function; moderate LVE; grade 2  diastolic dysfunction; biatrial enlargement; severe RV dysfunction; mild  TR with severe pulmonary hypertension.  2. Left ventricular ejection fraction, by estimation, is 20 to 25%. The  left ventricle has severely decreased function. The left ventricle  demonstrates global  hypokinesis. The left ventricular internal cavity size  was moderately dilated. Left ventricular diastolic parameters are consistent with Grade II diastolic  dysfunction (pseudonormalization). Elevated left atrial pressure.  3. Right ventricular systolic function is severely reduced. The right  ventricular size is normal. There is severely elevated pulmonary artery  systolic pressure.  4. Left atrial size was mildly dilated.  5. Right atrial size was moderately dilated.  6. The mitral valve is normal in structure. Trivial mitral valve  regurgitation. No evidence of mitral stenosis.  7. The aortic valve is tricuspid. Aortic valve regurgitation is not  visualized. No aortic stenosis is present.  8. The inferior vena cava is dilated in size with <50% respiratory  variability, suggesting right atrial pressure of 15 mmHg.  Patient Profile   Jack Chavez is a 64 y.o. male with a history of hypertension and arthritis who is being seen for evaluation of new onset systolic CHF at the request of Dr. 77.   Assessment & Plan    New Onset Acute Biventricular CHF with Severe Pulmonary Hypertension  - Patient presented with dyspnea and ascites. - BNP 1,446. - Chest x-ray showed cardiomegaly and layering right pleural effusion with right lower lobe airspace opacity. - Echo showed LVEF of 20-25% with moderate LV enlargement, global hypokinesis, and  grade 2 diastolic dysfunction. Also noted to have severe RV dysfunction with mild TR and severe pulmonary hypertension.  He has diuresed about 60 L.  He is able to lie flat without any shortness of breath.  He will be transferred to [facility] today for right left heart catheterization.  Hypertension  Blood pressure remains fairly well controlled. AKI Creatinine is   fairly stable.     Hyperkalemia - Resolved with Lokelma. Stable at 4.4 today.  Morbid Obesity Of service importance of weight loss.  Otherwise, per primary team.   For questions  or updates, please contact [facility] Please consult [email or web address] for contact info under  Mertie Moores, MD  09/28/2019 8:11 AM    [facility],  [place], [place]  [address] Phone: [phone]; Fax: [phone]

## 2019-09-28 NOTE — Progress Notes (Signed)
PROGRESS NOTE    Irene Mitcham  MOL:[id number] DOB: 04/29/1956 DOA: 09/19/2019 PCP: Patient, No Pcp Per   Brief Narrative: Jereme Loren is a 64 y.o. male with a history of morbid obesity. He presented secondary to shortness of breath on exertion in addition to peripheral edema.  He is found to have biventricular heart failure.  He was started on aggressive diuresis.  Cardiology on board and plan for heart cardiac cath on 4/2.  Nephrology consulted secondary to kidney disease in setting of heart failure.   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  Patient with an EF of 20 to 25% with grade 2 diastolic dysfunction.  Echo was significant for generalized hypokinesis.  Cardiology consulted.  Patiently diuresed with IV Lasix.  Weight on admission of 170.4 kg.  Weight is down 19.6 kg to date (no weight today).  Urine output over the last 24 hours of 3 L.  Patient is overall net -19.9 L.  Cardiology planning for left and right heart catheterization today -Cardiology recommendations: L/R heart cath, BiDil, metoprolol -Lasix per nephrology recommendations as mentioned below  Acute kidney injury. Unknown baseline.  Creatinine of 2.07 on admission.  Peak creatinine of 2.10.  Improving on IV diuresis with Lasix and 1 dose of acetazolamide for developing alkalosis.  Nephrology consulted and is following.  Patient likely has underlying CKD.  Foley was placed secondary to urinary incontinence and was removed on 3/30. Received two doses of lasix on 4/1 -Nephrology recommendations: pending today, avoid NSAIDs  Acute respiratory failure with  hypoxia Secondary to heart failure exacerbation.  Weaning oxygen to room air as able. -Continue to wean oxygen and manage above problems  Diarrhea Appears resolved.  Elevated bilirubin Right upper quadrant ultrasound was obtained with concern for possible cirrhosis.  Moderate ascites noted as well.  Patient without any abdominal pain.  This is a new diagnosis.  No paracentesis performed to date.  No leukocytosis or confusion. Hyperbilirubinemia resolved.  Essential hypertension Patient started on BiDil and metoprolol -Continue BiDil and metoprolol per cardiology recommendations  Hyperkalemia Likely secondary to kidney injury.  Patient received Lokelma.  Resolved.  Acute gout Initially right knee flare and is s/p arthrocentesis with improvement of symptoms. He has now developed left elbow symptoms -Prednisone -Voltaren gel  Morbid obesity Body mass index is 43.86 kg/m.    DVT prophylaxis: Heparin subcutaneous Code Status:   Code Status: Full Code Family Communication: None at bedside Disposition Plan: Discharge likely home pending continued cardiology management and recommendations   Consultants:   Cardiology  Nephrology  Procedures:   TRANSTHORACIC ECHOCARDIOGRAM (09/20/2019) IMPRESSIONS    1. Severe global reduction in LV systolic function; moderate LVE; grade 2  diastolic dysfunction; biatrial enlargement; severe RV dysfunction; mild  TR with severe pulmonary hypertension.  2. Left ventricular ejection fraction, by estimation, is 20 to 25%. The  left ventricle has severely decreased function. The left ventricle  demonstrates global hypokinesis. The left ventricular internal cavity size  was moderately dilated. Left  ventricular diastolic parameters are consistent with Grade II diastolic  dysfunction (pseudonormalization). Elevated left atrial pressure.  3. Right ventricular systolic function is severely  reduced. The right  ventricular size is normal. There  is severely elevated pulmonary artery  systolic pressure.  4. Left atrial size was mildly dilated.  5. Right atrial size was moderately dilated.  6. The mitral valve is normal in structure. Trivial mitral valve  regurgitation. No evidence of mitral stenosis.  7. The aortic valve is tricuspid. Aortic valve regurgitation is not  visualized. No aortic stenosis is present.  8. The inferior vena cava is dilated in size with <50% respiratory  variability, suggesting right atrial pressure of 15 mmHg.    Antimicrobials:  None   Subjective: Left elbow pain. Some improvement with voltaren gel. Usually does not get gout flares in his elbow but has noticed worsening symptoms with lowered weight.  Objective: Vitals:   09/27/19 1000 09/27/19 1337 09/27/19 2058 09/28/19 0457  BP:  125/68 (!) 144/81 (!) 162/96  Pulse:  92 90 88  Resp:  18 18   Temp:  97.8 F (36.6 C) 98.7 F (37.1 C) 98 F (36.7 C)  TempSrc:  Oral Oral   SpO2: 92% 93% 95% 97%  Weight:      Height:        Intake/Output Summary (Last 24 hours) at 09/28/2019 0813 Last data filed at 09/28/2019 1610 Gross per 24 hour  Intake 330 ml  Output 3000 ml  Net -2670 ml   Filed Weights   09/25/19 0846 09/26/19 0505 09/27/19 0658  Weight: (!) 153.7 kg (!) 150 kg (!) 150.8 kg    Examination:  General exam: Appears calm and comfortable Respiratory system: Clear to auscultation. Respiratory effort normal. Cardiovascular system: S1 & S2 heard, RRR. No murmurs, rubs, gallops or clicks. Gastrointestinal system: Abdomen is nondistended, soft and nontender. No organomegaly or masses felt. Normal bowel sounds heard. Central nervous system: Alert and oriented. No focal neurological deficits. Extremities: LE edema. No calf tenderness. Left elbow is slightly warm and tender but no appreciable effusion noticed Skin: No cyanosis. No rashes Psychiatry: Judgement and insight appear normal. Mood & affect appropriate.    Data Reviewed:  I have personally reviewed following labs and imaging studies  CBC: Recent Labs  Lab 09/22/19 0556 09/24/19 0504  WBC 6.6 5.3  HGB 15.2 13.8  HCT 49.1 43.6  MCV 91.4 92.0  PLT 228 960   Basic Metabolic Panel: Recent Labs  Lab 09/23/19 0514 09/23/19 0514 09/24/19 0504 09/25/19 0531 09/26/19 0507 09/27/19 0446 09/28/19 0447  NA 146*   < > 141 139 140 139 141  K 4.0   < > 4.4 4.9 4.5 4.4 4.5  CL 95*   < > 91* 92* 94* 95* 96*  CO2 39*   < > 39* 37* 35* 35* 35*  GLUCOSE 103*   < > 117* 140* 133* 117* 140*  BUN 24*   < > 24* 32* 42* 47* 54*  CREATININE 1.66*   < > 1.49* 1.65* 1.61* 1.59* 1.60*  CALCIUM 8.4*   < > 8.8* 8.8* 9.3 8.9 9.1  MG 1.7  --   --  2.0  --   --   --   PHOS 4.9*   < > 3.8 4.8* 3.9 4.4 3.7   < > = values in this interval not displayed.   GFR: Estimated Creatinine Clearance: 74.3 mL/min (A) (by C-G formula based on SCr of 1.6 mg/dL (H)). Liver Function Tests: Recent Labs  Lab 09/24/19 0554 09/25/19 0531 09/26/19 0507 09/27/19 0446 09/28/19 0447  AST 19  --   --   --   --  ALT 12  --   --   --   --   ALKPHOS 49  --   --   --   --   BILITOT 1.1  --   --   --   --   PROT 6.8  --   --   --   --   ALBUMIN 3.1* 2.9* 3.2* 3.0* 3.4*   No results for input(s): LIPASE, AMYLASE in the last 168 hours. No results for input(s): AMMONIA in the last 168 hours. Coagulation Profile: Recent Labs  Lab 09/22/19 0556  INR 1.5*   Cardiac Enzymes: No results for input(s): CKTOTAL, CKMB, CKMBINDEX, TROPONINI in the last 168 hours. BNP (last 3 results) No results for input(s): PROBNP in the last 8760 hours. HbA1C: No results for input(s): HGBA1C in the last 72 hours. CBG: No results for input(s): GLUCAP in the last 168 hours. Lipid Profile: No results for input(s): CHOL, HDL, LDLCALC, TRIG, CHOLHDL, LDLDIRECT in the last 72 hours. Thyroid Function Tests: No results for input(s): TSH, T4TOTAL, FREET4, T3FREE, THYROIDAB in the last 72 hours. Anemia Panel: No  results for input(s): VITAMINB12, FOLATE, FERRITIN, TIBC, IRON, RETICCTPCT in the last 72 hours. Sepsis Labs: No results for input(s): PROCALCITON, LATICACIDVEN in the last 168 hours.  Recent Results (from the past 240 hour(s))  SARS CORONAVIRUS 2 (TAT 6-24 HRS) Nasopharyngeal Nasopharyngeal Swab     Status: None   Collection Time: 09/19/19 11:38 PM   Specimen: Nasopharyngeal Swab  Result Value Ref Range Status   SARS Coronavirus 2 NEGATIVE NEGATIVE Final    Comment: (NOTE) SARS-CoV-2 target nucleic acids are NOT DETECTED. The SARS-CoV-2 RNA is generally detectable in upper and lower respiratory specimens during the acute phase of infection. Negative results do not preclude SARS-CoV-2 infection, do not rule out co-infections with other pathogens, and should not be used as the sole basis for treatment or other patient management decisions. Negative results must be combined with clinical observations, patient history, and epidemiological information. The expected result is Negative. Fact Sheet for Patients: HairSlick.no Fact Sheet for Healthcare Providers: quierodirigir.com This test is not yet approved or cleared by the [place] FDA and  has been authorized for detection and/or diagnosis of SARS-CoV-2 by FDA under an Emergency Use Authorization (EUA). This EUA will remain  in effect (meaning this test can be used) for the duration of the COVID-19 declaration under Section 56 4(b)(1) of the Act, 21 U.S.C. section 360bbb-3(b)(1), unless the authorization is terminated or revoked sooner. Performed at [facility], [address] [place], [place] [address]   Body fluid culture     Status: None   Collection Time: 09/23/19  5:27 PM   Specimen: Synovial, Right Knee; Body Fluid  Result Value Ref Range Status   Specimen Description   Final    JOINT FLUID Performed at [facility], [address]  [place], [place] [address]    Special Requests   Final    RIGHT KNEE Performed at [facility], [address] [place], [place] [address]    Gram Stain   Final    WBC PRESENT,BOTH PMN AND MONONUCLEAR NO ORGANISMS SEEN CALLED T.Mina Marble  [id number] @2024  BY V.WILKINS Performed at [facility], [address], [place], [place]    Culture   Final    NO GROWTH 3 DAYS Performed at [facility], [address], [place], [place]    Report Status 09/27/2019 FINAL  Final         Radiology Studies: No results found.      Scheduled Meds: . acetaminophen  500 mg Oral BID  . allopurinol  100 mg Oral Daily  . diclofenac Sodium  2 g Topical QID  . heparin  5,000 Units Subcutaneous Q8H  . isosorbide-hydrALAZINE  1 tablet Oral BID  . lidocaine  1 patch Transdermal Q24H  . lidocaine (PF)  15 mL Intradermal Once  . metoprolol succinate  25 mg Oral Daily  . polyethylene glycol  17 g Oral Daily  . predniSONE  40 mg Oral Q breakfast  . sodium chloride flush  3 mL Intravenous Q12H  . sodium chloride flush  3 mL Intravenous Q12H   Continuous Infusions: . sodium chloride    . sodium chloride    . sodium chloride 75 mL/hr at 09/28/19 9794     LOS: 8 days     Jacquelin Hawking, MD [facility] 09/28/2019, 8:13 AM  If 7PM-7AM, please contact night-coverage [email or web address]

## 2019-09-28 NOTE — Progress Notes (Signed)
Progress Note  Patient Name: Jack Chavez Date of Encounter: 09/28/2019  Primary Cardiologist: Verne Carrow, MD  Subjective   Doing well He has diuresed 16-1/2 L so far during this admission.  .  Inpatient Medications    Scheduled Meds: . acetaminophen  500 mg Oral BID  . allopurinol  100 mg Oral Daily  . diclofenac Sodium  2 g Topical QID  . heparin  5,000 Units Subcutaneous Q8H  . isosorbide-hydrALAZINE  1 tablet Oral BID  . lidocaine  1 patch Transdermal Q24H  . lidocaine (PF)  15 mL Intradermal Once  . metoprolol succinate  25 mg Oral Daily  . polyethylene glycol  17 g Oral Daily  . predniSONE  40 mg Oral Q breakfast  . sodium chloride flush  3 mL Intravenous Q12H  . sodium chloride flush  3 mL Intravenous Q12H   Continuous Infusions: . sodium chloride    . sodium chloride    . sodium chloride 75 mL/hr at 09/28/19 0621   PRN Meds: sodium chloride, sodium chloride, acetaminophen, sodium chloride flush, sodium chloride flush   Vital Signs    Vitals:   09/27/19 1000 09/27/19 1337 09/27/19 2058 09/28/19 0457  BP:  125/68 (!) 144/81 (!) 162/96  Pulse:  92 90 88  Resp:  18 18   Temp:  97.8 F (36.6 C) 98.7 F (37.1 C) 98 F (36.7 C)  TempSrc:  Oral Oral   SpO2: 92% 93% 95% 97%  Weight:      Height:        Intake/Output Summary (Last 24 hours) at 09/28/2019 0806 Last data filed at 09/28/2019 0644 Gross per 24 hour  Intake 330 ml  Output 3000 ml  Net -2670 ml   Last 3 Weights 09/27/2019 09/26/2019 09/25/2019  Weight (lbs) 332 lb 7.3 oz 330 lb 11 oz 338 lb 13.6 oz  Weight (kg) 150.8 kg 150 kg 153.7 kg      Telemetry    Normal sinus rhythm, rates 80's to 90's, with PVCs.  - Personally Reviewed  ECG    No new ECG tracing today. - Personally Reviewed  Physical Exam   Physical Exam: Blood pressure (!) 162/96, pulse 88, temperature 98 F (36.7 C), resp. rate 18, height 6\' 1"  (1.854 m), weight (!) 150.8 kg, SpO2 97 %.  GEN:   Obese male,  NAD    HEENT: Normal NECK: No JVD; No carotid bruits LYMPHATICS: No lymphadenopathy CARDIAC: RRR , no murmurs, rubs, gallops RESPIRATORY:  Clear to auscultation without rales, wheezing or rhonchi  ABDOMEN: Soft, non-tender, non-distended MUSCULOSKELETAL:  Edema is significantly improved.  SKIN: Warm and dry NEUROLOGIC:  Alert and oriented x 3   Labs    High Sensitivity Troponin:  No results for input(s): TROPONINIHS in the last 720 hours.    Chemistry Recent Labs  Lab 09/24/19 0554 09/25/19 0531 09/26/19 0507 09/27/19 0446 09/28/19 0447  NA  --    < > 140 139 141  K  --    < > 4.5 4.4 4.5  CL  --    < > 94* 95* 96*  CO2  --    < > 35* 35* 35*  GLUCOSE  --    < > 133* 117* 140*  BUN  --    < > 42* 47* 54*  CREATININE  --    < > 1.61* 1.59* 1.60*  CALCIUM  --    < > 9.3 8.9 9.1  PROT 6.8  --   --   --   --  ALBUMIN 3.1*   < > 3.2* 3.0* 3.4*  AST 19  --   --   --   --   ALT 12  --   --   --   --   ALKPHOS 49  --   --   --   --   BILITOT 1.1  --   --   --   --   GFRNONAA  --    < > 45* 46* 46*  GFRAA  --    < > 53* 54* 53*  ANIONGAP  --    < > 11 9 10    < > = values in this interval not displayed.     Hematology Recent Labs  Lab 09/22/19 0556 09/24/19 0504  WBC 6.6 5.3  RBC 5.37 4.74  HGB 15.2 13.8  HCT 49.1 43.6  MCV 91.4 92.0  MCH 28.3 29.1  MCHC 31.0 31.7  RDW 16.3* 15.7*  PLT 228 243    BNPNo results for input(s): BNP, PROBNP in the last 168 hours.   DDimer No results for input(s): DDIMER in the last 168 hours.   Radiology    No results found.  Cardiac Studies   Echocardiogram 09/20/2019: Impressions: 1. Severe global reduction in LV systolic function; moderate LVE; grade 2  diastolic dysfunction; biatrial enlargement; severe RV dysfunction; mild  TR with severe pulmonary hypertension.  2. Left ventricular ejection fraction, by estimation, is 20 to 25%. The  left ventricle has severely decreased function. The left ventricle  demonstrates global  hypokinesis. The left ventricular internal cavity size  was moderately dilated. Left ventricular diastolic parameters are consistent with Grade II diastolic  dysfunction (pseudonormalization). Elevated left atrial pressure.  3. Right ventricular systolic function is severely reduced. The right  ventricular size is normal. There is severely elevated pulmonary artery  systolic pressure.  4. Left atrial size was mildly dilated.  5. Right atrial size was moderately dilated.  6. The mitral valve is normal in structure. Trivial mitral valve  regurgitation. No evidence of mitral stenosis.  7. The aortic valve is tricuspid. Aortic valve regurgitation is not  visualized. No aortic stenosis is present.  8. The inferior vena cava is dilated in size with <50% respiratory  variability, suggesting right atrial pressure of 15 mmHg.  Patient Profile   Jack Chavez is a 64 y.o. male with a history of hypertension and arthritis who is being seen for evaluation of new onset systolic CHF at the request of Dr. 77.   Assessment & Plan    New Onset Acute Biventricular CHF with Severe Pulmonary Hypertension  - Patient presented with dyspnea and ascites. - BNP 1,446. - Chest x-ray showed cardiomegaly and layering right pleural effusion with right lower lobe airspace opacity. - Echo showed LVEF of 20-25% with moderate LV enlargement, global hypokinesis, and  grade 2 diastolic dysfunction. Also noted to have severe RV dysfunction with mild TR and severe pulmonary hypertension.  He has diuresed about 60 L.  He is able to lie flat without any shortness of breath.  He will be transferred to [facility] today for right left heart catheterization.  Hypertension  Blood pressure remains fairly well controlled. AKI Creatinine is   fairly stable.     Hyperkalemia - Resolved with Lokelma. Stable at 4.4 today.  Morbid Obesity Of service importance of weight loss.  Otherwise, per primary team.   For questions  or updates, please contact [facility] Please consult [email or web address] for contact info under  Mertie Moores, MD  09/28/2019 8:11 AM    [address] [place],  [place], [place]  [address] Phone: [phone]; Fax: [phone]

## 2019-09-28 NOTE — Progress Notes (Signed)
Nephrology Progress Note  Admit: 09/19/2019 LOS: 8  Subjective:  Had 3 liters UOP over 4/1.  Heart cath earlier today - severe pulmonary HTN and suspected chronic hypoventilation.  States had trouble with lying flat.    Review of systems    States his shortness of breath is much better than when he came in but still having shortness of breath when lying flat  Denies cp Denies n/v Gout pain - left elbow; knee better   04/01 0701 - 04/02 0700 In: 330 [P.O.:330] Out: 3000 [Urine:650]  Filed Weights   09/25/19 0846 09/26/19 0505 09/27/19 0658  Weight: (!) 153.7 kg (!) 150 kg (!) 150.8 kg    Scheduled Meds: . acetaminophen  500 mg Oral BID  . allopurinol  100 mg Oral Daily  . diclofenac Sodium  2 g Topical QID  . heparin  5,000 Units Subcutaneous Q8H  . isosorbide-hydrALAZINE  1 tablet Oral BID  . lidocaine  1 patch Transdermal Q24H  . metoprolol succinate  25 mg Oral Daily  . polyethylene glycol  17 g Oral Daily  . predniSONE  40 mg Oral Q breakfast  . sodium chloride flush  3 mL Intravenous Q12H   Continuous Infusions: . sodium chloride    . sodium chloride     PRN Meds:.sodium chloride, sodium chloride, acetaminophen, hydrALAZINE, sodium chloride flush  Current Labs: reviewed    Physical Exam:  Blood pressure (!) 179/87, pulse 90, temperature 98.4 F (36.9 C), temperature source Oral, resp. rate 18, height 6\' 1"  (1.854 m), weight (!) 150.8 kg, SpO2 97 %. Morbidly obese, in bed NAD at rest  Clear anteriorly unlabored at rest Heart S1S2 no rub 1-2+ edema in the legs Distended abdomen soft nontender Neuro alert and oriented x 3; provides hx and follows commands psych normal mood and affect  GU no foley; urinal with urine   1. AKI improving.  May have some degree of CKD.  negative renal imaging for obstruction or masses at admission; admit UA without hematuria, pyuria; 1+ proteinuria present.  Baseline creatinine unknown - diuretics as below - continue supportive  care   2. Acute systolic/diastolic biventricular CHF exacerbation with hypervolemia/anasarca/ascites.  S/p lasix 40 mg IV TID then alkalosis and s/p diamox once on 3/29.  Then responding well to lasix 60 mg IV intermittently.  Severe pulm HTN and suspected chronic hypoventilation related to obesity  - Lasix 60 mg IV once now on 4/2 - Renal will follow labs over the weekend.  Volume status much improved and now post cath earlier today.  Defer ordering scheduled lasix but from my standpoint, ok for lasix 60 mg IV daily on 4/3 and 4/4 if renal function stable. Ok for additional dose if needed to optimize respiratory status. - weight down from 170.4 kg (first weight available on 3/26) to 150.8 kg on 4/1.  Ordered daily weights.   3. Gout flare - team gave prednisone x 1.  Colchicine less attractive option right now given renal function with high dose diuretics (which may actually be fueling flare) as well as the contrast exposure 4. Urinary retention - per pt report with foley earlier this hospitalization and cannot find earlier pvr documented.  Note bladder scan for 190 on 3/31. Repeat post void residual bladder scan and in/out cath if indicated.  5. Longstanding hypertension, suspect potential etiology of #1 and #2. Improved.  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 Daily NSAID use prior to admission; recently stopped for pain in knees - would not resume  on discharge please   Recent Labs  Lab 09/26/19 0507 09/26/19 0507 09/27/19 0446 09/27/19 0446 09/28/19 0447 09/28/19 1237 09/28/19 1242  NA 140   < > 139   < > 141 139 141  K 4.5   < > 4.4   < > 4.5 4.7 4.6  CL 94*  --  95*  --  96*  --   --   CO2 35*  --  35*  --  35*  --   --   GLUCOSE 133*  --  117*  --  140*  --   --   BUN 42*  --  47*  --  54*  --   --   CREATININE 1.61*  --  1.59*  --  1.60*  --   --   CALCIUM 9.3  --  8.9  --  9.1  --   --   PHOS 3.9  --  4.4  --  3.7  --   --    < > = values in this interval not displayed.   Recent  Labs  Lab 09/22/19 0556 09/22/19 0556 09/24/19 0504 09/28/19 1237 09/28/19 1242  WBC 6.6  --  5.3  --   --   HGB 15.2   < > 13.8 16.3 16.0  HCT 49.1   < > 43.6 48.0 47.0  MCV 91.4  --  92.0  --   --   PLT 228  --  243  --   --    < > = values in this interval not displayed.    Claudia Desanctis 09/28/2019 5:23 PM

## 2019-09-28 NOTE — Interval H&P Note (Signed)
History and Physical Interval Note:  09/28/2019 12:04 PM  Jack Chavez  has presented today for surgery, with the diagnosis of Heart failure.  The various methods of treatment have been discussed with the patient and family. After consideration of risks, benefits and other options for treatment, the patient has consented to  Procedure(s): RIGHT/LEFT HEART CATH AND CORONARY ANGIOGRAPHY (N/A) as a surgical intervention.  The patient's history has been reviewed, patient examined, no change in status, stable for surgery.  I have reviewed the patient's chart and labs.  Questions were answered to the patient's satisfaction.   Cath Lab Visit (complete for each Cath Lab visit)  Clinical Evaluation Leading to the Procedure:   ACS: No.  Non-ACS:    Anginal Classification: No Symptoms  Anti-ischemic medical therapy: Maximal Therapy (2 or more classes of medications)  Non-Invasive Test Results: No non-invasive testing performed  Prior CABG: No previous CABG        [facility] 09/28/2019 12:04 PM

## 2019-09-28 NOTE — Progress Notes (Signed)
Right radial Tr band removed site level 0. Gauze and tegaderm dressing palpable radial pulse. Right hand warm. Right arm resting elevated on pillow.

## 2019-09-29 LAB — CBC
HCT: 43.5 % (ref 39.0–52.0)
Hemoglobin: 13.8 g/dL (ref 13.0–17.0)
MCH: 28.2 pg (ref 26.0–34.0)
MCHC: 31.7 g/dL (ref 30.0–36.0)
MCV: 88.8 fL (ref 80.0–100.0)
Platelets: 323 10*3/uL (ref 150–400)
RBC: 4.9 MIL/uL (ref 4.22–5.81)
RDW: 15.3 % (ref 11.5–15.5)
WBC: 6.1 10*3/uL (ref 4.0–10.5)
nRBC: 0 % (ref 0.0–0.2)

## 2019-09-29 LAB — RENAL FUNCTION PANEL
Albumin: 3.1 g/dL — ABNORMAL LOW (ref 3.5–5.0)
Anion gap: 11 (ref 5–15)
BUN: 51 mg/dL — ABNORMAL HIGH (ref 8–23)
CO2: 34 mmol/L — ABNORMAL HIGH (ref 22–32)
Calcium: 9.1 mg/dL (ref 8.9–10.3)
Chloride: 97 mmol/L — ABNORMAL LOW (ref 98–111)
Creatinine, Ser: 1.52 mg/dL — ABNORMAL HIGH (ref 0.61–1.24)
GFR calc Af Amer: 56 mL/min — ABNORMAL LOW (ref >60)
GFR calc non Af Amer: 49 mL/min — ABNORMAL LOW (ref >60)
Glucose, Bld: 126 mg/dL — ABNORMAL HIGH (ref 70–99)
Phosphorus: 4.1 mg/dL (ref 2.5–4.6)
Potassium: 4.5 mmol/L (ref 3.5–5.1)
Sodium: 142 mmol/L (ref 135–145)

## 2019-09-29 MED ORDER — SODIUM CHLORIDE 0.9% FLUSH
3.0000 mL | Freq: Two times a day (BID) | INTRAVENOUS | Status: DC
  Administered 2019-09-29 – 2019-10-03 (×8): 3 mL via INTRAVENOUS

## 2019-09-29 MED ORDER — CARVEDILOL 6.25 MG PO TABS
6.2500 mg | ORAL_TABLET | Freq: Two times a day (BID) | ORAL | Status: DC
  Administered 2019-09-29 – 2019-09-30 (×3): 6.25 mg via ORAL
  Filled 2019-09-29 (×3): qty 1

## 2019-09-29 MED ORDER — FUROSEMIDE 10 MG/ML IJ SOLN
80.0000 mg | Freq: Once | INTRAMUSCULAR | Status: AC
  Administered 2019-09-29: 80 mg via INTRAVENOUS
  Filled 2019-09-29: qty 8

## 2019-09-29 MED ORDER — HEPARIN SODIUM (PORCINE) 5000 UNIT/ML IJ SOLN
5000.0000 [IU] | Freq: Three times a day (TID) | INTRAMUSCULAR | Status: DC
  Administered 2019-09-29 – 2019-10-03 (×13): 5000 [IU] via SUBCUTANEOUS
  Filled 2019-09-29 (×14): qty 1

## 2019-09-29 MED ORDER — ISOSORB DINITRATE-HYDRALAZINE 20-37.5 MG PO TABS
1.0000 | ORAL_TABLET | Freq: Three times a day (TID) | ORAL | Status: DC
  Administered 2019-09-29 (×3): 1 via ORAL
  Filled 2019-09-29 (×4): qty 1

## 2019-09-29 MED ORDER — SODIUM CHLORIDE 0.9% FLUSH: 3.0000 mL | INTRAVENOUS | Status: DC | PRN

## 2019-09-29 NOTE — Progress Notes (Signed)
I concur with previous RN assessment documentation.  

## 2019-09-29 NOTE — Progress Notes (Signed)
PROGRESS NOTE    Jack Chavez  YQM:[id number] DOB: Apr 20, 1956 DOA: 09/19/2019 PCP: Patient, No Pcp Per   Brief Narrative: Jack Chavez is a 64 y.o. male with a history of morbid obesity. He presented secondary to shortness of breath on exertion in addition to peripheral edema.  He is found to have biventricular heart failure.  He was started on aggressive diuresis.  Cardiology on board and plan for heart cardiac cath on 4/2.  Nephrology consulted secondary to kidney disease in setting of heart failure.   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  Patient with an EF of 20 to 25% with grade 2 diastolic dysfunction.  Echo was significant for generalized hypokinesis.  Cardiology consulted.  Patiently diuresed with IV Lasix.  Weight on admission of 170.4 kg.  Weight is down 30 kg to date (no weight today).  Urine output over the last 24 hours of 2.1 L.  Patient is overall net -22.5 L. L/R heart catheterization without evidence of obstructive disease. -Cardiology recommendations: BiDil, switch from metoprolol to Coreg, Lasix diuresis  Acute kidney injury. Unknown baseline.  Creatinine of 2.07 on admission.  Peak creatinine of 2.10.  Improving on IV diuresis with Lasix and 1 dose of acetazolamide for developing alkalosis.  Nephrology consulted and is following.  Patient likely has underlying CKD.  Foley was placed secondary to urinary incontinence and was removed on 3/30. Received two doses of lasix on 4/1 -Nephrology recommendations: pending  Acute respiratory failure with hypoxia Secondary to heart failure exacerbation.  Weaning oxygen to room  air as able. -Continue to wean oxygen and manage above problems  Diarrhea Appears resolved.  Elevated bilirubin Right upper quadrant ultrasound was obtained with concern for possible cirrhosis.  Moderate ascites noted as well.  Patient without any abdominal pain.  This is a new diagnosis.  No paracentesis performed to date.  No leukocytosis or confusion. Hyperbilirubinemia resolved.  Essential hypertension Patient started on BiDil and metoprolol -Continue BiDil and metoprolol per cardiology recommendations  Hyperkalemia Likely secondary to kidney injury.  Patient received Lokelma.  Resolved.  Acute gout Initially right knee flare and is s/p arthrocentesis with improvement of symptoms. He has now developed left elbow symptoms -Prednisone -Voltaren gel  Morbid obesity Body mass index is 40.85 kg/m.    DVT prophylaxis: Heparin subcutaneous Code Status:   Code Status: Full Code Family Communication: None at bedside Disposition Plan: Discharge to SNF pending cardiology recommendations   Consultants:   Cardiology  Nephrology  Procedures:   TRANSTHORACIC ECHOCARDIOGRAM (09/20/2019) IMPRESSIONS    1. Severe global reduction in LV systolic function; moderate LVE; grade 2  diastolic dysfunction; biatrial enlargement; severe RV dysfunction; mild  TR with severe pulmonary hypertension.  2. Left ventricular ejection fraction, by estimation, is 20 to 25%. The  left ventricle has severely decreased function. The left ventricle  demonstrates global hypokinesis. The left ventricular internal cavity size  was moderately dilated. Left  ventricular diastolic parameters are consistent with Grade II diastolic  dysfunction (pseudonormalization). Elevated left atrial pressure.  3. Right ventricular systolic function is severely reduced. The right  ventricular size is normal. There is severely elevated  pulmonary artery  systolic pressure.  4. Left atrial size was mildly dilated.    5. Right atrial size was moderately dilated.  6. The mitral valve is normal in structure. Trivial mitral valve  regurgitation. No evidence of mitral stenosis.  7. The aortic valve is tricuspid. Aortic valve regurgitation is not  visualized. No aortic stenosis is present.  8. The inferior vena cava is dilated in size with <50% respiratory  variability, suggesting right atrial pressure of 15 mmHg.    Antimicrobials:  None   Subjective: Trying to have a bowel movement. Otherwise, no issues overnight.  Objective: Vitals:   09/28/19 1515 09/28/19 1615 09/28/19 2157 09/29/19 0527  BP: (!) 161/85 (!) 179/87 (!) 154/94 (!) 157/94  Pulse: 93 90 92 86  Resp: 14 18 20 20   Temp:  98.4 F (36.9 C) 98.2 F (36.8 C) 98.7 F (37.1 C)  TempSrc:  Oral Oral Oral  SpO2: 92% 97% 98% 96%  Weight:    (!) 140.4 kg  Height:        Intake/Output Summary (Last 24 hours) at 09/29/2019 1314 Last data filed at 09/29/2019 1052 Gross per 24 hour  Intake 240 ml  Output 3250 ml  Net -3010 ml   Filed Weights   09/26/19 0505 09/27/19 0658 09/29/19 0527  Weight: (!) 150 kg (!) 150.8 kg (!) 140.4 kg    Examination:  General exam: Appears calm and comfortable Respiratory system: Clear to auscultation. Respiratory effort normal. Cardiovascular system: S1 & S2 heard, RRR. No murmurs, rubs, gallops or clicks. Gastrointestinal system: Abdomen is protuberant, soft and nontender. No organomegaly or masses felt. Normal bowel sounds heard. Central nervous system: Alert and oriented. No focal neurological deficits. Extremities: 1+ LE edema. No calf tenderness Skin: No cyanosis. No rashes Psychiatry: Judgement and insight appear normal. Mood & affect appropriate.     Data Reviewed: I have personally reviewed following labs and imaging studies  CBC: Recent Labs  Lab 09/24/19 0504 09/28/19 1237 09/28/19 1242 09/29/19 0752  WBC 5.3  --   --  6.1  HGB 13.8 16.3 16.0 13.8  HCT 43.6 48.0 47.0 43.5   MCV 92.0  --   --  88.8  PLT 243  --   --  323   Basic Metabolic Panel: Recent Labs  Lab 09/23/19 0514 09/24/19 0504 09/25/19 0531 09/25/19 0531 09/26/19 0507 09/26/19 0507 09/27/19 0446 09/28/19 0447 09/28/19 1237 09/28/19 1242 09/29/19 0505  NA 146*   < > 139   < > 140   < > 139 141 139 141 142  K 4.0   < > 4.9   < > 4.5   < > 4.4 4.5 4.7 4.6 4.5  CL 95*   < > 92*  --  94*  --  95* 96*  --   --  97*  CO2 39*   < > 37*  --  35*  --  35* 35*  --   --  34*  GLUCOSE 103*   < > 140*  --  133*  --  117* 140*  --   --  126*  BUN 24*   < > 32*  --  42*  --  47* 54*  --   --  51*  CREATININE 1.66*   < > 1.65*  --  1.61*  --  1.59* 1.60*  --   --  1.52*  CALCIUM 8.4*   < > 8.8*  --  9.3  --  8.9 9.1  --   --  9.1  MG 1.7  --  2.0  --   --   --   --   --   --   --   --   PHOS 4.9*   < > 4.8*  --  3.9  --  4.4 3.7  --   --  4.1   < > = values in this interval not displayed.   GFR: Estimated Creatinine Clearance: 75.1 mL/min (A) (by C-G formula based on SCr of 1.52 mg/dL (H)). Liver Function Tests: Recent Labs  Lab 09/24/19 0554 09/24/19 0554 09/25/19 0531 09/26/19 0507 09/27/19 0446 09/28/19 0447 09/29/19 0505  AST 19  --   --   --   --   --   --   ALT 12  --   --   --   --   --   --   ALKPHOS 49  --   --   --   --   --   --   BILITOT 1.1  --   --   --   --   --   --   PROT 6.8  --   --   --   --   --   --   ALBUMIN 3.1*   < > 2.9* 3.2* 3.0* 3.4* 3.1*   < > = values in this interval not displayed.   No results for input(s): LIPASE, AMYLASE in the last 168 hours. No results for input(s): AMMONIA in the last 168 hours. Coagulation Profile: No results for input(s): INR, PROTIME in the last 168 hours. Cardiac Enzymes: No results for input(s): CKTOTAL, CKMB, CKMBINDEX, TROPONINI in the last 168 hours. BNP (last 3 results) No results for input(s): PROBNP in the last 8760 hours. HbA1C: No results for input(s): HGBA1C in the last 72 hours. CBG: No results for input(s):  GLUCAP in the last 168 hours. Lipid Profile: No results for input(s): CHOL, HDL, LDLCALC, TRIG, CHOLHDL, LDLDIRECT in the last 72 hours. Thyroid Function Tests: No results for input(s): TSH, T4TOTAL, FREET4, T3FREE, THYROIDAB in the last 72 hours. Anemia Panel: No results for input(s): VITAMINB12, FOLATE, FERRITIN, TIBC, IRON, RETICCTPCT in the last 72 hours. Sepsis Labs: No results for input(s): PROCALCITON, LATICACIDVEN in the last 168 hours.  Recent Results (from the past 240 hour(s))  SARS CORONAVIRUS 2 (TAT 6-24 HRS) Nasopharyngeal Nasopharyngeal Swab     Status: None   Collection Time: 09/19/19 11:38 PM   Specimen: Nasopharyngeal Swab  Result Value Ref Range Status   SARS Coronavirus 2 NEGATIVE NEGATIVE Final    Comment: (NOTE) SARS-CoV-2 target nucleic acids are NOT DETECTED. The SARS-CoV-2 RNA is generally detectable in upper and lower respiratory specimens during the acute phase of infection. Negative results do not preclude SARS-CoV-2 infection, do not rule out co-infections with other pathogens, and should not be used as the sole basis for treatment or other patient management decisions. Negative results must be combined with clinical observations, patient history, and epidemiological information. The expected result is Negative. Fact Sheet for Patients: SugarRoll.be Fact Sheet for Healthcare Providers: https://www.woods-mathews.com/ This test is not yet approved or cleared by the [place] FDA and  has been authorized for detection and/or diagnosis of SARS-CoV-2 by FDA under an Emergency Use Authorization (EUA). This EUA will remain  in effect (meaning this test can be used) for the duration of the COVID-19 declaration under Section 56 4(b)(1) of the Act, 21 U.S.C. section 360bbb-3(b)(1), unless the authorization is terminated or revoked sooner. Performed at  [facility], [address], [place], [place] [address]     Body fluid culture     Status: None   Collection Time: 09/23/19  5:27 PM   Specimen: Synovial, Right Knee; Body Fluid  Result Value Ref Range Status   Specimen Description   Final    JOINT FLUID Performed at [facility], [address], [place], [place] [address]    Special Requests   Final    RIGHT KNEE Performed at [facility], [address], [place], [place] [address]    Gram Stain   Final    WBC PRESENT,BOTH PMN AND MONONUCLEAR NO ORGANISMS SEEN CALLED T.Mina Marble  [id number] @2024  BY V.WILKINS Performed at [facility], [address] [place], [place]    Culture   Final    NO GROWTH 3 DAYS Performed at [facility], [address], [place], [place]    Report Status 09/27/2019 FINAL  Final         Radiology Studies: CARDIAC CATHETERIZATION  Result Date: 09/28/2019  LV end diastolic pressure is moderately elevated.  Hemodynamic findings consistent with severe pulmonary hypertension.  1. Normal coronary anatomy 2. Moderately elevated LV filling pressures. 3. Severely elevated right heart filling pressures 4. Severe pulmonary HTN 5. Preserved cardiac output 6. Chronic severe respiratory failure. PH 7.36, PCO2 71, Bicarb 40. P02 152. Suspect chronic hypoventilation.        Scheduled Meds: . acetaminophen  500 mg Oral BID  . allopurinol  100 mg Oral Daily  . carvedilol  6.25 mg Oral BID WC  . diclofenac Sodium  2 g Topical QID  . heparin  5,000 Units Subcutaneous Q8H  . isosorbide-hydrALAZINE  1 tablet Oral TID  . lidocaine  1 patch Transdermal Q24H  . polyethylene glycol  17 g Oral Daily  . predniSONE  40 mg Oral Q breakfast  . sodium chloride flush  3 mL Intravenous Q12H  . sodium chloride flush  3 mL Intravenous Q12H   Continuous Infusions: . sodium chloride    . sodium chloride       LOS: 9 days     11/28/2019, MD [facility] 09/29/2019, 1:14 PM  If  7PM-7AM, please contact night-coverage [email or web address]

## 2019-09-29 NOTE — Progress Notes (Signed)
Progress Note  Patient Name: Jack Chavez Date of Encounter: 09/29/2019  Primary Cardiologist: Verne Carrow, MD   Subjective   No CP; dyspnea improving  Inpatient Medications    Scheduled Meds: . acetaminophen  500 mg Oral BID  . allopurinol  100 mg Oral Daily  . diclofenac Sodium  2 g Topical QID  . heparin  5,000 Units Subcutaneous Q8H  . isosorbide-hydrALAZINE  1 tablet Oral BID  . lidocaine  1 patch Transdermal Q24H  . metoprolol succinate  25 mg Oral Daily  . polyethylene glycol  17 g Oral Daily  . predniSONE  40 mg Oral Q breakfast  . sodium chloride flush  3 mL Intravenous Q12H   Continuous Infusions: . sodium chloride    . sodium chloride     PRN Meds: sodium chloride, sodium chloride, acetaminophen, sodium chloride flush   Vital Signs    Vitals:   09/28/19 1515 09/28/19 1615 09/28/19 2157 09/29/19 0527  BP: (!) 161/85 (!) 179/87 (!) 154/94 (!) 157/94  Pulse: 93 90 92 86  Resp: 14 18 20 20   Temp:  98.4 F (36.9 C) 98.2 F (36.8 C) 98.7 F (37.1 C)  TempSrc:  Oral Oral Oral  SpO2: 92% 97% 98% 96%  Weight:    (!) 140.4 kg  Height:        Intake/Output Summary (Last 24 hours) at 09/29/2019 0737 Last data filed at 09/29/2019 0528 Gross per 24 hour  Intake 240 ml  Output 2850 ml  Net -2610 ml   Last 3 Weights 09/29/2019 09/27/2019 09/26/2019  Weight (lbs) 309 lb 9.6 oz 332 lb 7.3 oz 330 lb 11 oz  Weight (kg) 140.434 kg 150.8 kg 150 kg      Telemetry    Sinus- Personally Reviewed   Physical Exam   GEN: No acute distress.  Obese Neck: No JVD Cardiac: RRR, no murmurs, rubs, or gallops.  Respiratory: Clear to auscultation bilaterally. GI: Soft, nontender, non-distended  MS: No edema Neuro:  Nonfocal  Psych: Normal affect   Labs    Chemistry Recent Labs  Lab 09/24/19 0554 09/25/19 0531 09/27/19 0446 09/27/19 0446 09/28/19 0447 09/28/19 0447 09/28/19 1237 09/28/19 1242 09/29/19 0505  NA  --    < > 139   < > 141   < > 139 141 142    K  --    < > 4.4   < > 4.5   < > 4.7 4.6 4.5  CL  --    < > 95*  --  96*  --   --   --  97*  CO2  --    < > 35*  --  35*  --   --   --  34*  GLUCOSE  --    < > 117*  --  140*  --   --   --  126*  BUN  --    < > 47*  --  54*  --   --   --  51*  CREATININE  --    < > 1.59*  --  1.60*  --   --   --  1.52*  CALCIUM  --    < > 8.9  --  9.1  --   --   --  9.1  PROT 6.8  --   --   --   --   --   --   --   --   ALBUMIN 3.1*   < >  3.0*  --  3.4*  --   --   --  3.1*  AST 19  --   --   --   --   --   --   --   --   ALT 12  --   --   --   --   --   --   --   --   ALKPHOS 49  --   --   --   --   --   --   --   --   BILITOT 1.1  --   --   --   --   --   --   --   --   GFRNONAA  --    < > 46*  --  46*  --   --   --  49*  GFRAA  --    < > 54*  --  53*  --   --   --  56*  ANIONGAP  --    < > 9  --  10  --   --   --  11   < > = values in this interval not displayed.     Hematology Recent Labs  Lab 09/24/19 0504 09/28/19 1237 09/28/19 1242  WBC 5.3  --   --   RBC 4.74  --   --   HGB 13.8 16.3 16.0  HCT 43.6 48.0 47.0  MCV 92.0  --   --   MCH 29.1  --   --   MCHC 31.7  --   --   RDW 15.7*  --   --   PLT 243  --   --     Radiology    CARDIAC CATHETERIZATION  Result Date: 09/28/2019  LV end diastolic pressure is moderately elevated.  Hemodynamic findings consistent with severe pulmonary hypertension.  1. Normal coronary anatomy 2. Moderately elevated LV filling pressures. 3. Severely elevated right heart filling pressures 4. Severe pulmonary HTN 5. Preserved cardiac output 6. Chronic severe respiratory failure. PH 7.36, PCO2 71, Bicarb 40. P02 152. Suspect chronic hypoventilation.    Patient Profile     64 y.o. male with past medical history of hypertension admitted with acute systolic congestive heart failure.  Echocardiogram showed severe LV dysfunction, severe RV dysfunction, severe pulmonary hypertension.  Assessment & Plan    1 acute systolic congestive heart failure-I/O-22479  since admission; Wt 140.4 kg.  Patient improving but remains volume overloaded.  We will give Lasix 80 mg IV x1 today.  2 nonischemic cardiomyopathy-likely secondary to hypertension.  We will continue BiDil but increase to 3 times daily.  We will not give ARB or Entresto at this point but can consider in the future once renal function stable.  Discontinue Toprol and add carvedilol 6.25 mg twice daily.  Titrate medications as tolerated by blood pressure and pulse.  Will need repeat echocardiogram 3 months after medications fully titrated.  3 acute on chronic stage III kidney disease-renal function unchanged today.  We will continue to follow with diuresis.  4 severe pulmonary hypertension-likely secondary to pulmonary venous hypertension in combination with obesity hypoventilation syndrome and sleep apnea.  Will need sleep study following discharge.  5 morbid obesity-patient needs weight loss.  6 hypertension-blood pressure remains elevated today.  Increase BiDil to 3 times daily.  Titrate as needed.  For questions or updates, please contact [facility] Please consult [email or web address] for contact info under  Signed, Kirk Ruths, MD  09/29/2019, 7:37 AM

## 2019-09-30 ENCOUNTER — Inpatient Hospital Stay (HOSPITAL_COMMUNITY): Payer: BLUE CROSS/BLUE SHIELD

## 2019-09-30 LAB — RENAL FUNCTION PANEL
Albumin: 3.2 g/dL — ABNORMAL LOW (ref 3.5–5.0)
Anion gap: 8 (ref 5–15)
BUN: 53 mg/dL — ABNORMAL HIGH (ref 8–23)
CO2: 36 mmol/L — ABNORMAL HIGH (ref 22–32)
Calcium: 9.1 mg/dL (ref 8.9–10.3)
Chloride: 96 mmol/L — ABNORMAL LOW (ref 98–111)
Creatinine, Ser: 1.54 mg/dL — ABNORMAL HIGH (ref 0.61–1.24)
GFR calc Af Amer: 56 mL/min — ABNORMAL LOW (ref >60)
GFR calc non Af Amer: 48 mL/min — ABNORMAL LOW (ref >60)
Glucose, Bld: 141 mg/dL — ABNORMAL HIGH (ref 70–99)
Phosphorus: 4 mg/dL (ref 2.5–4.6)
Potassium: 4.9 mmol/L (ref 3.5–5.1)
Sodium: 140 mmol/L (ref 135–145)

## 2019-09-30 MED ORDER — CARVEDILOL 12.5 MG PO TABS
12.5000 mg | ORAL_TABLET | Freq: Two times a day (BID) | ORAL | Status: DC
  Administered 2019-09-30 – 2019-10-02 (×4): 12.5 mg via ORAL
  Filled 2019-09-30 (×4): qty 1

## 2019-09-30 MED ORDER — CARVEDILOL 6.25 MG PO TABS
6.2500 mg | ORAL_TABLET | Freq: Once | ORAL | Status: AC
  Administered 2019-09-30: 14:00:00 6.25 mg via ORAL
  Filled 2019-09-30: qty 1

## 2019-09-30 MED ORDER — FUROSEMIDE 10 MG/ML IJ SOLN
80.0000 mg | Freq: Once | INTRAMUSCULAR | Status: AC
  Administered 2019-09-30: 80 mg via INTRAVENOUS
  Filled 2019-09-30: qty 8

## 2019-09-30 MED ORDER — ISOSORB DINITRATE-HYDRALAZINE 20-37.5 MG PO TABS
2.0000 | ORAL_TABLET | Freq: Three times a day (TID) | ORAL | Status: DC
  Administered 2019-09-30 – 2019-10-03 (×11): 2 via ORAL
  Filled 2019-09-30 (×13): qty 2

## 2019-09-30 NOTE — Progress Notes (Signed)
Progress Note  Patient Name: Jack Chavez Date of Encounter: 09/30/2019  Primary Cardiologist: Verne Carrow, MD   Subjective   Pt denies CP; dyspnea continues to improve  Inpatient Medications    Scheduled Meds: . acetaminophen  500 mg Oral BID  . allopurinol  100 mg Oral Daily  . carvedilol  6.25 mg Oral BID WC  . diclofenac Sodium  2 g Topical QID  . heparin  5,000 Units Subcutaneous Q8H  . isosorbide-hydrALAZINE  1 tablet Oral TID  . lidocaine  1 patch Transdermal Q24H  . polyethylene glycol  17 g Oral Daily  . predniSONE  40 mg Oral Q breakfast  . sodium chloride flush  3 mL Intravenous Q12H  . sodium chloride flush  3 mL Intravenous Q12H   Continuous Infusions: . sodium chloride    . sodium chloride     PRN Meds: sodium chloride, sodium chloride, acetaminophen, sodium chloride flush, sodium chloride flush   Vital Signs    Vitals:   09/29/19 0527 09/29/19 1410 09/29/19 2040 09/30/19 0536  BP: (!) 157/94 124/79 (!) 151/92 (!) 160/91  Pulse: 86 83 85 85  Resp: 20 20 17 18   Temp: 98.7 F (37.1 C) 98.1 F (36.7 C) 98 F (36.7 C) 99.2 F (37.3 C)  TempSrc: Oral Oral Oral Oral  SpO2: 96% 93% 98% 98%  Weight: (!) 140.4 kg     Height:        Intake/Output Summary (Last 24 hours) at 09/30/2019 0714 Last data filed at 09/30/2019 0600 Gross per 24 hour  Intake 480 ml  Output 3700 ml  Net -3220 ml   Last 3 Weights 09/29/2019 09/27/2019 09/26/2019  Weight (lbs) 309 lb 9.6 oz 332 lb 7.3 oz 330 lb 11 oz  Weight (kg) 140.434 kg 150.8 kg 150 kg      Telemetry    Sinus- Personally Reviewed   Physical Exam   GEN: WD  No acute distress.  Obese Neck: supple Cardiac: RRR Respiratory: CTA; no wheeze GI: Soft, NT MS: 1+ edema Neuro:  Grossly intact Psych: Normal affect   Labs    Chemistry Recent Labs  Lab 09/24/19 0554 09/25/19 0531 09/28/19 0447 09/28/19 1237 09/28/19 1242 09/29/19 0505 09/30/19 0522  NA  --    < > 141   < > 141 142 140  K  --     < > 4.5   < > 4.6 4.5 4.9  CL  --    < > 96*  --   --  97* 96*  CO2  --    < > 35*  --   --  34* 36*  GLUCOSE  --    < > 140*  --   --  126* 141*  BUN  --    < > 54*  --   --  51* 53*  CREATININE  --    < > 1.60*  --   --  1.52* 1.54*  CALCIUM  --    < > 9.1  --   --  9.1 9.1  PROT 6.8  --   --   --   --   --   --   ALBUMIN 3.1*   < > 3.4*  --   --  3.1* 3.2*  AST 19  --   --   --   --   --   --   ALT 12  --   --   --   --   --   --  ALKPHOS 49  --   --   --   --   --   --   BILITOT 1.1  --   --   --   --   --   --   GFRNONAA  --    < > 46*  --   --  49* 48*  GFRAA  --    < > 53*  --   --  56* 56*  ANIONGAP  --    < > 10  --   --  11 8   < > = values in this interval not displayed.     Hematology Recent Labs  Lab 09/24/19 0504 09/24/19 0504 09/28/19 1237 09/28/19 1242 09/29/19 0752  WBC 5.3  --   --   --  6.1  RBC 4.74  --   --   --  4.90  HGB 13.8   < > 16.3 16.0 13.8  HCT 43.6   < > 48.0 47.0 43.5  MCV 92.0  --   --   --  88.8  MCH 29.1  --   --   --  28.2  MCHC 31.7  --   --   --  31.7  RDW 15.7*  --   --   --  15.3  PLT 243  --   --   --  323   < > = values in this interval not displayed.    Radiology    CARDIAC CATHETERIZATION  Result Date: 09/01/1060  LV end diastolic pressure is moderately elevated.  Hemodynamic findings consistent with severe pulmonary hypertension.  1. Normal coronary anatomy 2. Moderately elevated LV filling pressures. 3. Severely elevated right heart filling pressures 4. Severe pulmonary HTN 5. Preserved cardiac output 6. Chronic severe respiratory failure. PH 7.36, PCO2 71, Bicarb 40. P02 152. Suspect chronic hypoventilation.    Patient Profile     64 y.o. male with past medical history of hypertension admitted with acute systolic congestive heart failure.  Echocardiogram showed severe LV dysfunction, severe RV dysfunction, severe pulmonary hypertension.  Assessment & Plan    1 acute systolic congestive heart failure-I/O-25699 since  admission.  Patient continues to improve but still has some degree of orthopnea and there is presacral edema.  We will give Lasix 80 mg IV x1 today.  Continue to follow renal function.    2 nonischemic cardiomyopathy-likely secondary to hypertension.  We will continue BiDil but increase to 2 tabs 3 times daily.  We will not give ARB or Entresto at this point but can consider in the future once renal function stable.  Continue carvedilol 6.25 mg twice daily.  Titrate medications as tolerated by blood pressure and pulse.  Will need repeat echocardiogram 3 months after medications fully titrated.  3 acute on chronic stage III kidney disease-renal function unchanged today.  We will continue to follow with diuresis.  4 severe pulmonary hypertension-likely secondary to pulmonary venous hypertension in combination with obesity hypoventilation syndrome and sleep apnea.  Will need sleep study following discharge.  5 morbid obesity-patient needs weight loss.  6 hypertension-blood pressure remains elevated today.  Increase BiDil to 2 pills 3 times daily.  Titrate as needed.  For questions or updates, please contact [place] Please consult [email or web address] for contact info under        Signed, Kirk Ruths, MD  09/30/2019, 7:14 AM

## 2019-09-30 NOTE — Plan of Care (Signed)

## 2019-09-30 NOTE — Progress Notes (Signed)
Creat stable w/ continued diuresis at 1.5.  No further suggestions, will sign off.   Vinson Moselle, MD 09/30/2019, 1:17 PM

## 2019-09-30 NOTE — Progress Notes (Addendum)
PROGRESS NOTE    Jack Chavez  NFA:[id number] DOB: 06-29-1955 DOA: 09/19/2019 PCP: Patient, No Pcp Per   Brief Narrative: Jack Chavez is a 64 y.o. male with a history of morbid obesity. He presented secondary to shortness of breath on exertion in addition to peripheral edema.  He is found to have biventricular heart failure.  He was started on aggressive diuresis.  Cardiology on board and plan for heart cardiac cath on 4/2.  Nephrology consulted secondary to kidney disease in setting of heart failure.   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  Patient with an EF of 20 to 25% with grade 2 diastolic dysfunction.  Echo was significant for generalized hypokinesis.  Cardiology consulted.  Patiently diuresed with IV Lasix.  Weight on admission of 170.4 kg.  Weight is down 30 kg to date (no weight today).  Urine output over the last 24 hours of 2.5 L.  Patient is overall net -25.7 L. L/R heart catheterization without evidence of obstructive disease. -Cardiology recommendations: BiDil, switch from metoprolol to Coreg, Lasix diuresis  Acute kidney injury. Unknown baseline.  Creatinine of 2.07 on admission.  Peak creatinine of 2.10.  Improving on IV diuresis with Lasix and 1 dose of acetazolamide for developing alkalosis.  Nephrology consulted and is following.  Patient likely has underlying CKD.  Foley was placed secondary to urinary incontinence and was removed on 3/30. Received two doses of lasix on 4/1 -Nephrology recommendations: pending  Acute respiratory failure with hypoxia Secondary to heart failure exacerbation.  Weaning oxygen to room  air as able. -Continue to wean oxygen and manage above problems  Diarrhea Appears resolved.  Elevated bilirubin Right upper quadrant ultrasound was obtained with concern for possible cirrhosis.  Moderate ascites noted as well.  Patient without any abdominal pain.  This is a new diagnosis.  No paracentesis performed to date.  No leukocytosis or confusion. Hyperbilirubinemia resolved.  Essential hypertension Patient started on BiDil and metoprolol -Continue BiDil and metoprolol per cardiology recommendations  Hyperkalemia Likely secondary to kidney injury.  Patient received Lokelma.  Resolved.  Acute gout Initially right knee flare and is s/p arthrocentesis with improvement of symptoms. He has now developed left elbow symptoms -Prednisone -Voltaren gel  Left elbow pain -Elbow x-ray  Morbid obesity Body mass index is 40.85 kg/m.    DVT prophylaxis: Heparin subcutaneous Code Status:   Code Status: Full Code Family Communication: None at bedside Disposition Plan: Discharge to SNF pending cardiology recommendations   Consultants:   Cardiology  Nephrology  Procedures:   TRANSTHORACIC ECHOCARDIOGRAM (09/20/2019) IMPRESSIONS    1. Severe global reduction in LV systolic function; moderate LVE; grade 2  diastolic dysfunction; biatrial enlargement; severe RV dysfunction; mild  TR with severe pulmonary hypertension.  2. Left ventricular ejection fraction, by estimation, is 20 to 25%. The  left ventricle has severely decreased function. The left ventricle  demonstrates global hypokinesis. The left ventricular internal cavity size  was moderately dilated. Left  ventricular diastolic parameters are consistent with Grade II diastolic  dysfunction (pseudonormalization). Elevated left atrial pressure.  3. Right ventricular systolic function is severely reduced. The right  ventricular size  is normal. There is severely elevated pulmonary artery  systolic pressure.  4. Left  atrial size was mildly dilated.  5. Right atrial size was moderately dilated.  6. The mitral valve is normal in structure. Trivial mitral valve  regurgitation. No evidence of mitral stenosis.  7. The aortic valve is tricuspid. Aortic valve regurgitation is not  visualized. No aortic stenosis is present.  8. The inferior vena cava is dilated in size with <50% respiratory  variability, suggesting right atrial pressure of 15 mmHg.    Antimicrobials:  None   Subjective: Still with some elbow pain. Orthopnea.  Objective: Vitals:   09/29/19 0527 09/29/19 1410 09/29/19 2040 09/30/19 0536  BP: (!) 157/94 124/79 (!) 151/92 (!) 160/91  Pulse: 86 83 85 85  Resp: 20 20 17 18   Temp: 98.7 F (37.1 C) 98.1 F (36.7 C) 98 F (36.7 C) 99.2 F (37.3 C)  TempSrc: Oral Oral Oral Oral  SpO2: 96% 93% 98% 98%  Weight: (!) 140.4 kg     Height:        Intake/Output Summary (Last 24 hours) at 09/30/2019 0947 Last data filed at 09/30/2019 0600 Gross per 24 hour  Intake 480 ml  Output 3700 ml  Net -3220 ml   Filed Weights   09/26/19 0505 09/27/19 0658 09/29/19 0527  Weight: (!) 150 kg (!) 150.8 kg (!) 140.4 kg    Examination:  General exam: Appears calm and comfortable Respiratory system: Clear to auscultation. Respiratory effort normal. Cardiovascular system: S1 & S2 heard, RRR. No murmurs, rubs, gallops or clicks. Gastrointestinal system: Abdomen is nondistended, soft and nontender. No organomegaly or masses felt. Normal bowel sounds heard. Central nervous system: Alert and oriented. No focal neurological deficits. Extremities: No edema. No calf tenderness Skin: No cyanosis. No rashes Psychiatry: Judgement and insight appear normal. Mood & affect appropriate.      Data Reviewed: I have personally reviewed following labs and imaging studies  CBC: Recent Labs  Lab 09/24/19 0504 09/28/19 1237 09/28/19 1242 09/29/19 0752  WBC 5.3  --   --  6.1  HGB 13.8 16.3 16.0 13.8  HCT  43.6 48.0 47.0 43.5  MCV 92.0  --   --  88.8  PLT 243  --   --  323   Basic Metabolic Panel: Recent Labs  Lab 09/25/19 0531 09/25/19 0531 09/26/19 0507 09/26/19 0507 09/27/19 0446 09/27/19 0446 09/28/19 0447 09/28/19 1237 09/28/19 1242 09/29/19 0505 09/30/19 0522  NA 139   < > 140   < > 139   < > 141 139 141 142 140  K 4.9   < > 4.5   < > 4.4   < > 4.5 4.7 4.6 4.5 4.9  CL 92*   < > 94*  --  95*  --  96*  --   --  97* 96*  CO2 37*   < > 35*  --  35*  --  35*  --   --  34* 36*  GLUCOSE 140*   < > 133*  --  117*  --  140*  --   --  126* 141*  BUN 32*   < > 42*  --  47*  --  54*  --   --  51* 53*  CREATININE 1.65*   < > 1.61*  --  1.59*  --  1.60*  --   --  1.52* 1.54*  CALCIUM 8.8*   < > 9.3  --  8.9  --  9.1  --   --  9.1 9.1  MG 2.0  --   --   --   --   --   --   --   --   --   --   PHOS 4.8*   < > 3.9  --  4.4  --  3.7  --   --  4.1 4.0   < > = values in this interval not displayed.   GFR: Estimated Creatinine Clearance: 74.2 mL/min (A) (by C-G formula based on SCr of 1.54 mg/dL (H)). Liver Function Tests: Recent Labs  Lab 09/24/19 0554 09/25/19 0531 09/26/19 0507 09/27/19 0446 09/28/19 0447 09/29/19 0505 09/30/19 0522  AST 19  --   --   --   --   --   --   ALT 12  --   --   --   --   --   --   ALKPHOS 49  --   --   --   --   --   --   BILITOT 1.1  --   --   --   --   --   --   PROT 6.8  --   --   --   --   --   --   ALBUMIN 3.1*   < > 3.2* 3.0* 3.4* 3.1* 3.2*   < > = values in this interval not displayed.   No results for input(s): LIPASE, AMYLASE in the last 168 hours. No results for input(s): AMMONIA in the last 168 hours. Coagulation Profile: No results for input(s): INR, PROTIME in the last 168 hours. Cardiac Enzymes: No results for input(s): CKTOTAL, CKMB, CKMBINDEX, TROPONINI in the last 168 hours. BNP (last 3 results) No results for input(s): PROBNP in the last 8760 hours. HbA1C: No results for input(s): HGBA1C in the last 72 hours. CBG: No results  for input(s): GLUCAP in the last 168 hours. Lipid Profile: No results for input(s): CHOL, HDL, LDLCALC, TRIG, CHOLHDL, LDLDIRECT in the last 72 hours. Thyroid Function Tests: No results for input(s): TSH, T4TOTAL, FREET4, T3FREE, THYROIDAB in the last 72 hours. Anemia Panel: No results for input(s): VITAMINB12, FOLATE, FERRITIN, TIBC, IRON, RETICCTPCT in the last 72 hours. Sepsis Labs: No results for input(s): PROCALCITON, LATICACIDVEN in the last 168 hours.  Recent Results (from the past 240 hour(s))  Body fluid culture     Status: None   Collection Time: 09/23/19  5:27 PM   Specimen: Synovial, Right Knee; Body Fluid  Result Value Ref Range Status   Specimen Description   Final    JOINT FLUID Performed at [facility], [address] [place], [place] [address]    Special Requests   Final    RIGHT KNEE Performed at [facility], [address], [place], [place] [address]    Gram Stain   Final    WBC PRESENT,BOTH PMN AND MONONUCLEAR NO ORGANISMS SEEN CALLED T.Mina Marble  [phone] @2024  BY V.WILKINS Performed at [facility], [address] [place], [place]    Culture   Final    NO GROWTH 3 DAYS Performed at [facility], [address], [place], [place]    Report Status 09/27/2019 FINAL  Final         Radiology Studies: CARDIAC CATHETERIZATION  Result Date: 09/28/2019  LV end diastolic pressure is moderately elevated.  Hemodynamic findings consistent with severe pulmonary hypertension.  1. Normal coronary anatomy  2. Moderately elevated LV filling pressures. 3. Severely elevated right heart filling pressures 4. Severe pulmonary HTN 5. Preserved cardiac output 6. Chronic severe respiratory failure. PH 7.36, PCO2 71, Bicarb 40. P02 152. Suspect chronic hypoventilation.        Scheduled Meds: . acetaminophen  500 mg Oral BID  . allopurinol  100 mg Oral Daily  . carvedilol  6.25 mg Oral  BID WC  . diclofenac Sodium  2 g Topical QID  . furosemide  80 mg Intravenous Once  . heparin  5,000 Units Subcutaneous Q8H  . isosorbide-hydrALAZINE  2 tablet Oral TID  . lidocaine  1 patch Transdermal Q24H  . polyethylene glycol  17 g Oral Daily  . predniSONE  40 mg Oral Q breakfast  . sodium chloride flush  3 mL Intravenous Q12H  . sodium chloride flush  3 mL Intravenous Q12H   Continuous Infusions: . sodium chloride    . sodium chloride       LOS: 10 days     Cordelia Poche, MD [facility] 09/30/2019, 9:47 AM  If 7PM-7AM, please contact night-coverage [email or web address]

## 2019-09-30 NOTE — Progress Notes (Signed)
Called by nursing for sBP elevated to the 140-150s after medications given. HR in the 80s. I increase coreg to 12.5 mg BID. Will follow along.

## 2019-10-01 DIAGNOSIS — I420 Dilated cardiomyopathy: Secondary | ICD-10-CM

## 2019-10-01 LAB — BASIC METABOLIC PANEL
Anion gap: 9 (ref 5–15)
BUN: 59 mg/dL — ABNORMAL HIGH (ref 8–23)
CO2: 32 mmol/L (ref 22–32)
Calcium: 9.2 mg/dL (ref 8.9–10.3)
Chloride: 95 mmol/L — ABNORMAL LOW (ref 98–111)
Creatinine, Ser: 1.56 mg/dL — ABNORMAL HIGH (ref 0.61–1.24)
GFR calc Af Amer: 55 mL/min — ABNORMAL LOW (ref >60)
GFR calc non Af Amer: 47 mL/min — ABNORMAL LOW (ref >60)
Glucose, Bld: 117 mg/dL — ABNORMAL HIGH (ref 70–99)
Potassium: 4.7 mmol/L (ref 3.5–5.1)
Sodium: 136 mmol/L (ref 135–145)

## 2019-10-01 LAB — RENAL FUNCTION PANEL
Albumin: 3.4 g/dL — ABNORMAL LOW (ref 3.5–5.0)
Anion gap: 10 (ref 5–15)
BUN: 56 mg/dL — ABNORMAL HIGH (ref 8–23)
CO2: 32 mmol/L (ref 22–32)
Calcium: 9.2 mg/dL (ref 8.9–10.3)
Chloride: 95 mmol/L — ABNORMAL LOW (ref 98–111)
Creatinine, Ser: 1.67 mg/dL — ABNORMAL HIGH (ref 0.61–1.24)
GFR calc Af Amer: 50 mL/min — ABNORMAL LOW (ref >60)
GFR calc non Af Amer: 44 mL/min — ABNORMAL LOW (ref >60)
Glucose, Bld: 123 mg/dL — ABNORMAL HIGH (ref 70–99)
Phosphorus: 4 mg/dL (ref 2.5–4.6)
Potassium: 5.4 mmol/L — ABNORMAL HIGH (ref 3.5–5.1)
Sodium: 137 mmol/L (ref 135–145)

## 2019-10-01 NOTE — Progress Notes (Signed)
PROGRESS NOTE    Jack Chavez  FOY:[id number] DOB: 1956/02/21 DOA: 09/19/2019 PCP: Patient, No Pcp Per   Brief Narrative: Jack Chavez is a 64 y.o. male with a history of morbid obesity. He presented secondary to shortness of breath on exertion in addition to peripheral edema.  He is found to have biventricular heart failure.  He was started on aggressive diuresis.  Cardiology on board and plan for heart cardiac cath on 4/2.  Nephrology consulted secondary to kidney disease in setting of heart failure.   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  Patient with an EF of 20 to 25% with grade 2 diastolic dysfunction.  Echo was significant for generalized hypokinesis.  Cardiology consulted.  Patiently diuresed with IV Lasix.  Weight on admission of 170.4 kg.  Weight is down 33.4 kg to date.  Urine output over the last 24 hours of 5.37 L.  Patient is overall net -30.6 L. L/R heart catheterization without evidence of obstructive disease. -Cardiology recommendations: BiDil, switched from metoprolol to Coreg, Lasix diuresis  Acute kidney injury. Unknown baseline.  Creatinine of 2.07 on admission.  Peak creatinine of 2.10.  Improved on IV diuresis with Lasix and 1 dose of acetazolamide for developing alkalosis.  Nephrology consulted and was following.  Patient likely has underlying CKD.  Foley was placed secondary to urinary incontinence and was removed on 3/30. Creatinine mostly stable with steadily rising BUN. Potassium elevated today -Nephrology recommendations: signed off 4/4 -Recommend to hold  diuresis -Repeat BMP to check potassium; no concerning findings on personal review of telemetry  Acute respiratory failure with hypoxia Secondary to heart failure exacerbation initially.  Weaning oxygen to room air as able. May have underlying chronic hypoxia in setting of OHS -Continue to wean oxygen and manage above problems  Diarrhea Appears resolved.  Elevated bilirubin Right upper quadrant ultrasound was obtained with concern for possible cirrhosis.  Moderate ascites noted as well.  Patient without any abdominal pain.  This is a new diagnosis.  No paracentesis performed to date.  No leukocytosis or confusion. Hyperbilirubinemia resolved.  Essential hypertension Patient started on BiDil and metoprolol and switched from metoprolol to Coreg -Continue BiDil and Coregper cardiology recommendations  Hyperkalemia Likely secondary to kidney injury.  Patient received Lokelma.  Resolved.  Acute gout Initially right knee flare and is s/p arthrocentesis with improvement of symptoms. He has now developed left elbow symptoms. X-ray negative. Symptoms improving. -Prednisone -Voltaren gel  Morbid obesity Body mass index is 39.85 kg/m.    DVT prophylaxis: Heparin subcutaneous Code Status:   Code Status: Full Code Family Communication: None at bedside Disposition Plan: Discharge to SNF pending cardiology recommendations   Consultants:   Cardiology  Nephrology  Procedures:   TRANSTHORACIC ECHOCARDIOGRAM (09/20/2019) IMPRESSIONS    1. Severe global reduction in LV systolic function; moderate LVE; grade 2  diastolic dysfunction; biatrial enlargement; severe RV dysfunction; mild  TR with severe pulmonary hypertension.  2. Left ventricular ejection fraction, by estimation, is 20 to 25%. The  left ventricle has severely decreased function. The left ventricle  demonstrates global hypokinesis. The left ventricular internal  cavity size  was moderately dilated. Left  ventricular  diastolic parameters are consistent with Grade II diastolic  dysfunction (pseudonormalization). Elevated left atrial pressure.  3. Right ventricular systolic function is severely reduced. The right  ventricular size is normal. There is severely elevated pulmonary artery  systolic pressure.  4. Left atrial size was mildly dilated.  5. Right atrial size was moderately dilated.  6. The mitral valve is normal in structure. Trivial mitral valve  regurgitation. No evidence of mitral stenosis.  7. The aortic valve is tricuspid. Aortic valve regurgitation is not  visualized. No aortic stenosis is present.  8. The inferior vena cava is dilated in size with <50% respiratory  variability, suggesting right atrial pressure of 15 mmHg.    Antimicrobials:  None   Subjective: Elbow pain improved. No issues overnight  Objective: Vitals:   09/30/19 2024 10/01/19 0500 10/01/19 0503 10/01/19 0951  BP: 112/73 (!) 158/97  (!) 150/85  Pulse: 82 84  83  Resp: (!) 21 18    Temp: 97.8 F (36.6 C) (!) 97.4 F (36.3 C)    TempSrc: Oral Oral    SpO2: 95% 95%    Weight:   (!) 137 kg   Height:        Intake/Output Summary (Last 24 hours) at 10/01/2019 1221 Last data filed at 10/01/2019 0950 Gross per 24 hour  Intake 363 ml  Output 4900 ml  Net -4537 ml   Filed Weights   09/29/19 0527 09/30/19 1819 10/01/19 0503  Weight: (!) 140.4 kg (!) 137.7 kg (!) 137 kg    Examination:  General exam: Appears calm and comfortable Respiratory system: Clear to auscultation. Respiratory effort normal. Cardiovascular system: S1 & S2 heard, RRR. No murmurs, rubs, gallops or clicks. Gastrointestinal system: Abdomen is nondistended, soft and nontender. No organomegaly or masses felt. Normal bowel sounds heard. Central nervous system: Alert and oriented. No focal neurological deficits. Extremities: Trace LE edema. No calf tenderness Skin: No cyanosis. No rashes Psychiatry: Judgement and insight appear normal.  Mood & affect appropriate.      Data Reviewed: I have personally reviewed following labs and imaging studies  CBC: Recent Labs  Lab 09/28/19 1237 09/28/19 1242 09/29/19 0752  WBC  --   --  6.1  HGB 16.3 16.0 13.8  HCT 48.0 47.0 43.5  MCV  --   --  88.8  PLT  --   --  323   Basic Metabolic Panel: Recent Labs  Lab 09/25/19 0531 09/26/19 0507 09/27/19 0446 09/27/19 0446 09/28/19 0447 09/28/19 0447 09/28/19 1237 09/28/19 1242 09/29/19 0505 09/30/19 0522 10/01/19 0522  NA 139   < > 139   < > 141   < > 139 141 142 140 137  K 4.9   < > 4.4   < > 4.5   < > 4.7 4.6 4.5 4.9 5.4*  CL 92*   < > 95*  --  96*  --   --   --  97* 96* 95*  CO2 37*   < > 35*  --  35*  --   --   --  34* 36* 32  GLUCOSE 140*   < > 117*  --  140*  --   --   --  126* 141* 123*  BUN 32*   < > 47*  --  54*  --   --   --  51* 53* 56*  CREATININE 1.65*   < > 1.59*  --  1.60*  --   --   --  1.52* 1.54* 1.67*  CALCIUM 8.8*   < > 8.9  --  9.1  --   --   --  9.1 9.1 9.2  MG 2.0  --   --   --   --   --   --   --   --   --   --   PHOS 4.8*   < > 4.4  --  3.7  --   --   --  4.1 4.0 4.0   < > = values in this interval not displayed.   GFR: Estimated Creatinine Clearance: 67.5 mL/min (A) (by C-G formula based on SCr of 1.67 mg/dL (H)). Liver Function Tests: Recent Labs  Lab 09/27/19 0446 09/28/19 0447 09/29/19 0505 09/30/19 0522 10/01/19 0522  ALBUMIN 3.0* 3.4* 3.1* 3.2* 3.4*   No results for input(s): LIPASE, AMYLASE in the last 168 hours. No results for input(s): AMMONIA in the last 168 hours. Coagulation Profile: No results for input(s): INR, PROTIME in the last 168 hours. Cardiac Enzymes: No results for input(s): CKTOTAL, CKMB, CKMBINDEX, TROPONINI in the last 168 hours. BNP (last 3 results) No results for input(s): PROBNP in the last 8760 hours. HbA1C: No results for input(s): HGBA1C in the last 72 hours. CBG: No results for input(s): GLUCAP in the last 168 hours. Lipid Profile: No results for  input(s): CHOL, HDL, LDLCALC, TRIG, CHOLHDL, LDLDIRECT in the last 72 hours. Thyroid Function Tests: No results for input(s): TSH, T4TOTAL, FREET4, T3FREE, THYROIDAB in the last 72 hours. Anemia Panel: No results for input(s): VITAMINB12, FOLATE, FERRITIN, TIBC, IRON, RETICCTPCT in the last 72 hours. Sepsis Labs: No results for input(s): PROCALCITON, LATICACIDVEN in the last 168 hours.  Recent Results (from the past 240 hour(s))  Body fluid culture     Status: None   Collection Time: 09/23/19  5:27 PM   Specimen: Synovial, Right Knee; Body Fluid  Result Value Ref Range Status   Specimen Description   Final    JOINT FLUID Performed at [facility] [address] [place], [place] [address]    Special Requests   Final    RIGHT KNEE Performed at [facility] [address] [place], [place] [address]    Gram Stain   Final    WBC PRESENT,BOTH PMN AND MONONUCLEAR NO ORGANISMS SEEN CALLED T.Delmar Landau  [phone] @2024  BY V.WILKINS Performed at [facility] [address] [place], [place] [address]    Culture   Final    NO GROWTH 3 DAYS Performed at [facility], [place] [address] [place], [place] [address]    Report Status 09/27/2019 FINAL  Final         Radiology Studies: DG ELBOW COMPLETE LEFT (3+VIEW)  Result Date: 09/30/2019 CLINICAL DATA:  Elbow pain for 3 days after having fluid drained. EXAM: LEFT ELBOW - COMPLETE 3+ VIEW COMPARISON:  None. FINDINGS: No fracture. Small supracondylar process the medial distal humeral metaphysis. No concerning bone lesion. Elbow joint normally spaced and aligned. No arthropathic changes. No joint effusion. Soft tissues are unremarkable. IMPRESSION: No fracture, joint abnormality or joint effusion. Electronically Signed   By: Lajean Manes M.D.   On: 09/30/2019 10:17        Scheduled Meds: . acetaminophen  500 mg Oral BID  . allopurinol  100 mg Oral Daily  . carvedilol  12.5  mg Oral BID WC  . diclofenac Sodium  2 g Topical QID  . heparin  5,000 Units Subcutaneous  Q8H  . isosorbide-hydrALAZINE  2 tablet Oral TID  . lidocaine  1 patch Transdermal Q24H  . polyethylene glycol  17 g Oral Daily  . predniSONE  40 mg Oral Q breakfast  . sodium chloride flush  3 mL Intravenous Q12H  . sodium chloride flush  3 mL Intravenous Q12H   Continuous Infusions: . sodium chloride    . sodium chloride       LOS: 11 days     Jacquelin Hawking, MD [facility] 10/01/2019, 12:21 PM  If 7PM-7AM, please contact night-coverage [email or web address]

## 2019-10-01 NOTE — Progress Notes (Signed)
Physical Therapy Treatment Patient Details Name: Jack Chavez MRN: [id number] DOB: Apr 19, 1956 Today's Date: 10/01/2019    History of Present Illness 64 year old with past medical history significant for hypertension morbid obesity who presents complaining of worsening shortness of breath on exertion and peripheral edema. Acute dx of Acute exacerbation of CHF and R knee pain.    PT Comments    Patient making good progress with therapy session. Focus this session on ambulation progression and pt able to increase distance to ~35' with RW. Min asssit required for walker management and pt required 2L/min O2. He desaturated to 89% and with standing break and slow pursed lip breathing sats improved to 96%. Pt continues to be limited by Rt knee pain and Lt elbow pain during transfers and gait and requires increased surface height for sit<>stand transfers. He will continue to benefit from skilled PT interventions to address impairments and progress mobility to PLOF. Acute PT will progress as able.   Follow Up Recommendations  SNF     Equipment Recommendations  None recommended by PT(TBD at facility)    Recommendations for Other Services       Precautions / Restrictions Precautions Precautions: Fall Precaution Comments: monitor O2 stats Restrictions Weight Bearing Restrictions: No    Mobility  Bed Mobility               General bed mobility comments: pt sitting in recliner at start of session, ended in recliner.  Transfers Overall transfer level: Needs assistance Equipment used: Rolling walker (2 wheeled) Transfers: Sit to/from Stand Sit to Stand: Min assist;+2 physical assistance;+2 safety/equipment;From elevated surface         General transfer comment: pt had elevated seat in recliner with pillows and blankets to assist with transfer. Pt required min assist +2 to initiate and complete power up, pt using bil UE's to initiate with decrease weight bearing on Lt UE due to elbow  pain. verbal cues required for safe reach back to recliner.  Ambulation/Gait Ambulation/Gait assistance: Min assist;+2 safety/equipment Gait Distance (Feet): 35 Feet Assistive device: Rolling walker (2 wheeled) Gait Pattern/deviations: Step-to pattern;Trunk flexed;Antalgic;Decreased stride length;Decreased stance time - right;Wide base of support Gait velocity: decreased   General Gait Details: pt with short steps in RW, pt with decreased stance time on Rt LE due to knee pain. Assist required for walker positioning/management. no overt LOB noted.   Stairs             Wheelchair Mobility    Modified Rankin (Stroke Patients Only)       Balance Overall balance assessment: Needs assistance Sitting-balance support: Feet supported;Single extremity supported Sitting balance-Leahy Scale: Good     Standing balance support: Bilateral upper extremity supported Standing balance-Leahy Scale: Poor         Cognition Arousal/Alertness: Awake/alert Behavior During Therapy: WFL for tasks assessed/performed Overall Cognitive Status: Within Functional Limits for tasks assessed         Exercises      General Comments        Pertinent Vitals/Pain Pain Assessment: Faces Faces Pain Scale: Hurts little more Pain Location: Rt knee and Lt elbow with weight bearing and movement Pain Descriptors / Indicators: Aching;Discomfort;Sore Pain Intervention(s): Limited activity within patient's tolerance;Monitored during session;Repositioned           PT Goals (current goals can now be found in the care plan section) Acute Rehab PT Goals Patient Stated Goal: to get my strength back. less pain PT Goal Formulation: With patient Time For Goal Achievement:  10/07/19 Potential to Achieve Goals: Good Progress towards PT goals: Progressing toward goals    Frequency    Min 3X/week      PT Plan Current plan remains appropriate       AM-PAC PT "6 Clicks" Mobility   Outcome  Measure  Help needed turning from your back to your side while in a flat bed without using bedrails?: A Lot Help needed moving from lying on your back to sitting on the side of a flat bed without using bedrails?: A Lot Help needed moving to and from a bed to a chair (including a wheelchair)?: A Lot Help needed standing up from a chair using your arms (e.g., wheelchair or bedside chair)?: A Little Help needed to walk in hospital room?: A Little   6 Click Score: 12    End of Session Equipment Utilized During Treatment: Gait belt;Oxygen Activity Tolerance: Patient limited by fatigue;Patient limited by pain Patient left: in chair;with call bell/phone within reach Nurse Communication: Mobility status PT Visit Diagnosis: Muscle weakness (generalized) (M62.81);Pain;Other abnormalities of gait and mobility (R26.89) Pain - Right/Left: Right Pain - part of body: Knee;Arm     Time: 1209-1228 PT Time Calculation (min) (ACUTE ONLY): 19 min  Charges:  $Gait Training: 8-22 mins                     Wynn Maudlin, DPT Physical Therapist with [facility] [phone]  10/01/2019 1:30 PM

## 2019-10-01 NOTE — Progress Notes (Signed)
Progress Note  Patient Name: Jack Chavez Date of Encounter: 10/01/2019  Primary Cardiologist: Verne Carrow, MD   Subjective   Overnight cardiology called for elevated blood pressures and Coreg was increased. Patient still has some orthopnea overnight. Tried to remove oxygen this AM but patient became sob. No chest pain. On 1L O2. Since admission has put out 30L.  Inpatient Medications    Scheduled Meds: . acetaminophen  500 mg Oral BID  . allopurinol  100 mg Oral Daily  . carvedilol  12.5 mg Oral BID WC  . diclofenac Sodium  2 g Topical QID  . heparin  5,000 Units Subcutaneous Q8H  . isosorbide-hydrALAZINE  2 tablet Oral TID  . lidocaine  1 patch Transdermal Q24H  . polyethylene glycol  17 g Oral Daily  . predniSONE  40 mg Oral Q breakfast  . sodium chloride flush  3 mL Intravenous Q12H  . sodium chloride flush  3 mL Intravenous Q12H   Continuous Infusions: . sodium chloride    . sodium chloride     PRN Meds: sodium chloride, sodium chloride, acetaminophen, sodium chloride flush, sodium chloride flush   Vital Signs    Vitals:   09/30/19 1819 09/30/19 2024 10/01/19 0500 10/01/19 0503  BP:  112/73 (!) 158/97   Pulse:  82 84   Resp:  (!) 21 18   Temp:  97.8 F (36.6 C) (!) 97.4 F (36.3 C)   TempSrc:  Oral Oral   SpO2:  95% 95%   Weight: (!) 137.7 kg   (!) 137 kg  Height:        Intake/Output Summary (Last 24 hours) at 10/01/2019 0845 Last data filed at 10/01/2019 0600 Gross per 24 hour  Intake 363 ml  Output 5375 ml  Net -5012 ml   Last 3 Weights 10/01/2019 09/30/2019 09/29/2019  Weight (lbs) 302 lb 0.5 oz 303 lb 9.2 oz 309 lb 9.6 oz  Weight (kg) 137 kg 137.7 kg 140.434 kg      Telemetry    NSr, Hr 80s, transient 1st degree AV block; 3bNSVT, first degree AV block - Personally Reviewed  ECG    No new - Personally Reviewed  Physical Exam   GEN: No acute distress.   Neck: No JVD Cardiac: RRR, no murmurs, rubs, or gallops.  Respiratory: diminished  breath sounds;1LO2 GI: Soft, nontender, non-distended  MS: No edema; No deformity. Neuro:  Nonfocal  Psych: Normal affect   Labs    High Sensitivity Troponin:  No results for input(s): TROPONINIHS in the last 720 hours.    Chemistry Recent Labs  Lab 09/29/19 0505 09/30/19 0522 10/01/19 0522  NA 142 140 137  K 4.5 4.9 5.4*  CL 97* 96* 95*  CO2 34* 36* 32  GLUCOSE 126* 141* 123*  BUN 51* 53* 56*  CREATININE 1.52* 1.54* 1.67*  CALCIUM 9.1 9.1 9.2  ALBUMIN 3.1* 3.2* 3.4*  GFRNONAA 49* 48* 44*  GFRAA 56* 56* 50*  ANIONGAP 11 8 10      Hematology Recent Labs  Lab 09/28/19 1237 09/28/19 1242 09/29/19 0752  WBC  --   --  6.1  RBC  --   --  4.90  HGB 16.3 16.0 13.8  HCT 48.0 47.0 43.5  MCV  --   --  88.8  MCH  --   --  28.2  MCHC  --   --  31.7  RDW  --   --  15.3  PLT  --   --  323  BNPNo results for input(s): BNP, PROBNP in the last 168 hours.   DDimer No results for input(s): DDIMER in the last 168 hours.   Radiology    DG ELBOW COMPLETE LEFT (3+VIEW)  Result Date: 09/30/2019 CLINICAL DATA:  Elbow pain for 3 days after having fluid drained. EXAM: LEFT ELBOW - COMPLETE 3+ VIEW COMPARISON:  None. FINDINGS: No fracture. Small supracondylar process the medial distal humeral metaphysis. No concerning bone lesion. Elbow joint normally spaced and aligned. No arthropathic changes. No joint effusion. Soft tissues are unremarkable. IMPRESSION: No fracture, joint abnormality or joint effusion. Electronically Signed   By: Lajean Manes M.D.   On: 09/30/2019 10:17    Cardiac Studies   Echo 09/20/19 1. Severe global reduction in LV systolic function; moderate LVE; grade 2  diastolic dysfunction; biatrial enlargement; severe RV dysfunction; mild  TR with severe pulmonary hypertension.  2. Left ventricular ejection fraction, by estimation, is 20 to 25%. The  left ventricle has severely decreased function. The left ventricle  demonstrates global hypokinesis. The left  ventricular internal cavity size  was moderately dilated. Left  ventricular diastolic parameters are consistent with Grade II diastolic  dysfunction (pseudonormalization). Elevated left atrial pressure.  3. Right ventricular systolic function is severely reduced. The right  ventricular size is normal. There is severely elevated pulmonary artery  systolic pressure.  4. Left atrial size was mildly dilated.  5. Right atrial size was moderately dilated.  6. The mitral valve is normal in structure. Trivial mitral valve  regurgitation. No evidence of mitral stenosis.  7. The aortic valve is tricuspid. Aortic valve regurgitation is not  visualized. No aortic stenosis is present.  8. The inferior vena cava is dilated in size with <50% respiratory  variability, suggesting right atrial pressure of 15 mmHg.   Cardiac cath 08/01/38  LV end diastolic pressure is moderately elevated.  Hemodynamic findings consistent with severe pulmonary hypertension.   1. Normal coronary anatomy 2. Moderately elevated LV filling pressures. 3. Severely elevated right heart filling pressures 4. Severe pulmonary HTN 5. Preserved cardiac output 6. Chronic severe respiratory failure. PH 7.36, PCO2 71, Bicarb 40. P02 152. Suspect chronic hypoventilation.   Patient Profile     64 y.o. male with pmh of HTN, systolic CHF, arthritis who is being evaluated for new onset systolic CHF.  Assessment & Plan    Acute combined systolic and distolic CHF - New onset this admission - EF 20-25% with G2DD; echo showed generalized hypokinesis - cath 4/2 showed normal cors and moderately elevated filling pressures, severe Pulmonary HTN - Has been on IV diuretics. Yesterday received Iv lasix 80 mg once - Has put out net total 30L since admission, 5.3Lovernight - weight 375>302lbs - creatinine 1.54 >1.67. Volume status is stable. Will hold on giving more lasix at this time  Nonischemic CM - thought to be secondary to HTN -  BiDil increased to 2 tabs TID.  - ARB/Entresto held for kidney function but can consider if renal function is stable - continue Coreg  AKI/CKD stage 3 - unknown baseline - on admission creatinine was 2.07 - has been improving with diuresis - nephrology was following. Creatinine has stable around 1.5 - Today up to 1.67  Severe Pulmonary HTN - likely secondary to obesity hypoventilation syndrome, sleep apnea - will need sleep study after discharge  Morbid obesity - recommend weight loss  HTN - Bidil increased to 2 tabs TID - pressuers mildly elevated - coreg increased yesterday  For questions or updates,  please contact [facility] Please consult [email or web address] for contact info under        Signed, Rayhana Slider David Stall, PA-C  10/01/2019, 8:45 AM

## 2019-10-01 NOTE — TOC Progression Note (Signed)
Transition of Care [facility]) - Progression Note    Patient Details  Name: Jack Chavez MRN: [id number] Date of Birth: 01-02-1956  [facility]) CM/SW Contact  Nealie Mchatton, Olegario Messier, RN Phone Number: 10/01/2019, 3:37 PM  Clinical Narrative:   Per patient permission spoke to his sister in his rm on speaker phone-Melissa Benavides(a CSW) they both chose another SNF[facility] Ran aware & following for auth.[facility] rep Tammy  notified.    Expected Discharge Plan: Skilled Nursing Facility Barriers to Discharge: Continued Medical Work up  Expected Discharge Plan and Services Expected Discharge Plan: Skilled Nursing Facility   Discharge Planning Services: CM Consult   Living arrangements for the past 2 months: Single Family Home                                       Social Determinants of Health (SDOH) Interventions    Readmission Risk Interventions No flowsheet data found.

## 2019-10-01 NOTE — Progress Notes (Signed)
Occupational Therapy Treatment Patient Details Name: Jack Chavez MRN: [id number] DOB: [birth year] Today's Date: 10/01/2019    History of present illness 64 year old with past medical history significant for hypertension morbid obesity who presents complaining of worsening shortness of breath on exertion and peripheral edema. Acute dx of Acute exacerbation of CHF and R knee pain.   OT comments  Pt agreeable to OOB.  Pt very pleasant and motivAted.    Follow Up Recommendations  SNF;Supervision/Assistance - 24 hour    Equipment Recommendations  Tub/shower bench       Precautions / Restrictions Precautions Precautions: Fall Precaution Comments: monitor O2 stats Restrictions Weight Bearing Restrictions: No       Mobility Bed Mobility Overal bed mobility: Needs Assistance Bed Mobility: Supine to Sit     Supine to sit: Mod assist;HOB elevated Sit to supine: Mod assist;HOB elevated   General bed mobility comments: pt sitting in recliner at start of session, ended in recliner.  Transfers Overall transfer level: Needs assistance Equipment used: Rolling walker (2 wheeled) Transfers: Sit to/from [facility] Sit to Stand: From elevated surface;Mod assist             Balance Overall balance assessment: Needs assistance Sitting-balance support: Feet supported;Single extremity supported Sitting balance-Leahy Scale: Good     Standing balance support: Bilateral upper extremity supported Standing balance-Leahy Scale: Poor                             ADL either performed or assessed with clinical judgement   ADL Overall ADL's : Needs assistance/impaired     Grooming: Set up;Sitting       Lower Body Bathing: Maximal assistance;Sit to/from stand;Cueing for compensatory techniques;Cueing for safety                  pt pleased he was able to reach LE and apply lotion. Elbow still painful but she is able to perform ADL activity. Elbow xray  negative             Vision Baseline Vision/History: No visual deficits            Cognition Arousal/Alertness: Awake/alert Behavior During Therapy: WFL for tasks assessed/performed Overall Cognitive Status: Within Functional Limits for tasks assessed                                                     Pertinent Vitals/ Pain       Pain Assessment: Faces Pain Score: 3  Faces Pain Scale: Hurts little more Pain Location: r elbow Pain Descriptors / Indicators: Sore Pain Intervention(s): Limited activity within patient's tolerance;Monitored during session         Frequency  Min 2X/week        Progress Toward Goals  OT Goals(current goals can now be found in the care plan section)  Progress towards OT goals: Progressing toward goals  Acute Rehab OT Goals Patient Stated Goal: to get my strength back. less pain  Plan Discharge plan needs to be updated       AM-PAC OT "6 Clicks" Daily Activity     Outcome Measure   Help from another person eating meals?: A Little Help from another person taking care of personal grooming?: A Little Help from another person toileting, which includes using toliet, bedpan,  or urinal?: A Lot Help from another person bathing (including washing, rinsing, drying)?: A Lot Help from another person to put on and taking off regular upper body clothing?: A Lot Help from another person to put on and taking off regular lower body clothing?: A Lot 6 Click Score: 14    End of Session Equipment Utilized During Treatment: Gait belt;Rolling walker;Oxygen(3L for mobility)  OT Visit Diagnosis: Unsteadiness on feet (R26.81);Other abnormalities of gait and mobility (R26.89);Pain Pain - Right/Left: Left Pain - part of body: (elbow; R knee)   Activity Tolerance Patient tolerated treatment well   Patient Left with call bell/phone within reach;with bed alarm set;Other (comment);in chair(bed in chair position)   Nurse  Communication Mobility status;Other (comment)(recommendations for L elbow)        Time: 1131-1200 OT Time Calculation (min): 29 min  Charges: OT General Charges $OT Visit: 1 Visit OT Treatments $Self Care/Home Management : 23-37 mins  Lise Auer, OT Acute Rehabilitation Services [phone] Office- [phone]      Lareen Mullings, Karin Golden D 10/01/2019, 2:10 PM

## 2019-10-02 LAB — RENAL FUNCTION PANEL
Albumin: 3.3 g/dL — ABNORMAL LOW (ref 3.5–5.0)
Anion gap: 10 (ref 5–15)
BUN: 59 mg/dL — ABNORMAL HIGH (ref 8–23)
CO2: 29 mmol/L (ref 22–32)
Calcium: 9.1 mg/dL (ref 8.9–10.3)
Chloride: 97 mmol/L — ABNORMAL LOW (ref 98–111)
Creatinine, Ser: 1.61 mg/dL — ABNORMAL HIGH (ref 0.61–1.24)
GFR calc Af Amer: 53 mL/min — ABNORMAL LOW (ref >60)
GFR calc non Af Amer: 45 mL/min — ABNORMAL LOW (ref >60)
Glucose, Bld: 135 mg/dL — ABNORMAL HIGH (ref 70–99)
Phosphorus: 4.3 mg/dL (ref 2.5–4.6)
Potassium: 5.2 mmol/L — ABNORMAL HIGH (ref 3.5–5.1)
Sodium: 136 mmol/L (ref 135–145)

## 2019-10-02 MED ORDER — CARVEDILOL 25 MG PO TABS
25.0000 mg | ORAL_TABLET | Freq: Two times a day (BID) | ORAL | Status: DC
  Administered 2019-10-02 – 2019-10-03 (×3): 25 mg via ORAL
  Filled 2019-10-02 (×3): qty 1

## 2019-10-02 NOTE — Progress Notes (Signed)
Supervised RN student Jack Chavez administer medications at approx 11 am. Medications were delayed d/t pt care being done.

## 2019-10-02 NOTE — Progress Notes (Signed)
Progress Note  Patient Name: Jack Chavez Date of Encounter: 10/02/2019  Primary Cardiologist: Lauree Chandler, MD   Subjective   Tried to wean off O2 yesterday and sats dropped to 89% and he was placed back on 2L O2. Patient feels getting up and moving around is getting easier. No chest pain  Inpatient Medications    Scheduled Meds: . acetaminophen  500 mg Oral BID  . allopurinol  100 mg Oral Daily  . carvedilol  12.5 mg Oral BID WC  . diclofenac Sodium  2 g Topical QID  . heparin  5,000 Units Subcutaneous Q8H  . isosorbide-hydrALAZINE  2 tablet Oral TID  . lidocaine  1 patch Transdermal Q24H  . polyethylene glycol  17 g Oral Daily  . predniSONE  40 mg Oral Q breakfast  . sodium chloride flush  3 mL Intravenous Q12H  . sodium chloride flush  3 mL Intravenous Q12H   Continuous Infusions: . sodium chloride    . sodium chloride     PRN Meds: sodium chloride, sodium chloride, acetaminophen, sodium chloride flush, sodium chloride flush   Vital Signs    Vitals:   10/01/19 1706 10/01/19 2010 10/02/19 0514 10/02/19 0727  BP: 125/83 126/74 (!) 138/93 (!) 152/91  Pulse:  81 98 86  Resp:  20 18 13   Temp:  98.4 F (36.9 C) 98 F (36.7 C) 99 F (37.2 C)  TempSrc:  Oral Oral Oral  SpO2:  95% 92% 99%  Weight:   136 kg   Height:        Intake/Output Summary (Last 24 hours) at 10/02/2019 0759 Last data filed at 10/02/2019 0000 Gross per 24 hour  Intake 723 ml  Output 1550 ml  Net -827 ml   Last 3 Weights 10/02/2019 10/01/2019 09/30/2019  Weight (lbs) 299 lb 13.2 oz 302 lb 0.5 oz 303 lb 9.2 oz  Weight (kg) 136 kg 137 kg 137.7 kg      Telemetry    NSR, Hr 70-80s, 4 beats NSVT - Personally Reviewed  ECG    No new - Personally Reviewed  Physical Exam   GEN: No acute distress.   Neck: No JVD Cardiac: RRR, no murmurs, rubs, or gallops.  Respiratory: Diminished sounds at bases, mild wheezing; 2LO2 GI: Soft, nontender, non-distended  MS: No edema; No deformity.  Neuro:  Nonfocal  Psych: Normal affect   Labs    High Sensitivity Troponin:  No results for input(s): TROPONINIHS in the last 720 hours.    Chemistry Recent Labs  Lab 09/30/19 0522 09/30/19 0522 10/01/19 0522 10/01/19 1242 10/02/19 0513  NA 140   < > 137 136 136  K 4.9   < > 5.4* 4.7 5.2*  CL 96*   < > 95* 95* 97*  CO2 36*   < > 32 32 29  GLUCOSE 141*   < > 123* 117* 135*  BUN 53*   < > 56* 59* 59*  CREATININE 1.54*   < > 1.67* 1.56* 1.61*  CALCIUM 9.1   < > 9.2 9.2 9.1  ALBUMIN 3.2*  --  3.4*  --  3.3*  GFRNONAA 48*   < > 44* 47* 45*  GFRAA 56*   < > 50* 55* 53*  ANIONGAP 8   < > 10 9 10    < > = values in this interval not displayed.     Hematology Recent Labs  Lab 09/28/19 1237 09/28/19 1242 09/29/19 0752  WBC  --   --  6.1  RBC  --   --  4.90  HGB 16.3 16.0 13.8  HCT 48.0 47.0 43.5  MCV  --   --  88.8  MCH  --   --  28.2  MCHC  --   --  31.7  RDW  --   --  15.3  PLT  --   --  323    BNPNo results for input(s): BNP, PROBNP in the last 168 hours.   DDimer No results for input(s): DDIMER in the last 168 hours.   Radiology    DG ELBOW COMPLETE LEFT (3+VIEW)  Result Date: 09/30/2019 CLINICAL DATA:  Elbow pain for 3 days after having fluid drained. EXAM: LEFT ELBOW - COMPLETE 3+ VIEW COMPARISON:  None. FINDINGS: No fracture. Small supracondylar process the medial distal humeral metaphysis. No concerning bone lesion. Elbow joint normally spaced and aligned. No arthropathic changes. No joint effusion. Soft tissues are unremarkable. IMPRESSION: No fracture, joint abnormality or joint effusion. Electronically Signed   By: Amie Portland M.D.   On: 09/30/2019 10:17    Cardiac Studies   Echo 09/20/19 1. Severe global reduction in LV systolic function; moderate LVE; grade 2  diastolic dysfunction; biatrial enlargement; severe RV dysfunction; mild  TR with severe pulmonary hypertension.  2. Left ventricular ejection fraction, by estimation, is 20 to 25%. The   left ventricle has severely decreased function. The left ventricle  demonstrates global hypokinesis. The left ventricular internal cavity size  was moderately dilated. Left  ventricular diastolic parameters are consistent with Grade II diastolic  dysfunction (pseudonormalization). Elevated left atrial pressure.  3. Right ventricular systolic function is severely reduced. The right  ventricular size is normal. There is severely elevated pulmonary artery  systolic pressure.  4. Left atrial size was mildly dilated.  5. Right atrial size was moderately dilated.  6. The mitral valve is normal in structure. Trivial mitral valve  regurgitation. No evidence of mitral stenosis.  7. The aortic valve is tricuspid. Aortic valve regurgitation is not  visualized. No aortic stenosis is present.  8. The inferior vena cava is dilated in size with <50% respiratory  variability, suggesting right atrial pressure of 15 mmHg.   Cardiac cath 09/28/19  LV end diastolic pressure is moderately elevated.  Hemodynamic findings consistent with severe pulmonary hypertension.  1. Normal coronary anatomy 2. Moderately elevated LV filling pressures. 3. Severely elevated right heart filling pressures 4. Severe pulmonary HTN 5. Preserved cardiac output 6. Chronic severe respiratory failure. PH 7.36, PCO2 71, Bicarb 40. P02 152. Suspect chronic hypoventilation.   Patient Profile     64 y.o. male with pmh of HTN, systolic CHF, arthritis who is being evaluated for new onset systolic CHF.  Assessment & Plan   Acute combined systolic and distolic CHF - New onset this admission - EF 20-25% with G2DD; echo showed generalized hypokinesis - cath 4/2 showed normal cors and moderately elevated filling pressures, severe Pulmonary HTN - Has been on IV diuretics. Last dose of diuretics was 4/4. Held for rising creatinine and stable volume status. - Has put out net total 31L since admission, 1.5L overnight - weight  375>299lb - creatinine 1.54 >1.67>1.61. Volume status is stable. Can likely start PO lasix for daily maintenance - continue to try and wean O2 - MD to see  Nonischemic CM - thought to be secondary to HTN - BiDil 2 tabs TID.  - ARB/Entresto held for kidney function but can consider if renal function is stable - continue Coreg  AKI/CKD stage 3 - unknown baseline - on admission creatinine was 2.07 - has been improving with diuresis - nephrology was following. Creatinine baseline around 1.5 - Cr 1.67 >1.61  Severe Pulmonary HTN - likely secondary to obesity hypoventilation syndrome, sleep apnea - will need sleep study after discharge  Morbid obesity - recommend weight loss  HTN - Bidil 2 tabs TID - coreg increased>>can titrate as needed pressure management - pressuers mildly elevate  For questions or updates, please contact [facility] Please consult [email or web address] for contact info under        Signed, Sheppard Luckenbach David Stall, PA-C  10/02/2019, 7:59 AM

## 2019-10-02 NOTE — Progress Notes (Signed)
Supervised RN student Morrie Sheldon administer Heparin SQ and Diclofenac topical

## 2019-10-02 NOTE — Progress Notes (Signed)
PROGRESS NOTE    Jack Chavez  TDS:[id number] DOB: Mar 13, 1956 DOA: 09/19/2019 PCP: Patient, No Pcp Per   Brief Narrative: Jack Chavez is a 64 y.o. male with a history of morbid obesity. He presented secondary to shortness of breath on exertion in addition to peripheral edema.  He is found to have biventricular heart failure.  He was started on aggressive diuresis.  Cardiology on board and plan for heart cardiac cath on 4/2.  Nephrology consulted secondary to kidney disease in setting of heart failure.   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  Patient with an EF of 20 to 25% with grade 2 diastolic dysfunction.  Echo was significant for generalized hypokinesis.  Cardiology consulted.  Patiently diuresed with IV Lasix.  Weight on admission of 170.4 kg.  Weight is down 34.4 kg to date.  Urine output over the last 24 hours of 1.55 L.  Patient is overall net -31.4 L. L/R heart catheterization without evidence of obstructive disease. Last dose of lasix on 4/4 -Cardiology recommendations: BiDil, switched from metoprolol to Coreg.  Acute kidney injury. Unknown baseline.  Creatinine of 2.07 on admission.  Peak creatinine of 2.10.  Improved on IV diuresis with Lasix and 1 dose of acetazolamide for developing alkalosis.  Nephrology consulted and was following.  Patient likely has underlying CKD.  Foley was placed secondary to urinary incontinence and was removed on 3/30. Creatinine mostly stable with steadily rising BUN. Potassium elevated slightly. Creatinine stabilized. -Nephrology recommendations: signed off  4/4  Hyperkalemia Likely secondary to kidney disease. Mild. No symptoms. Received Lokelma in the past. -Watch with repeat BMP  Acute respiratory failure with hypoxia Chronic respiratory failure with hypercapnia Secondary to heart failure exacerbation initially.  Weaning oxygen to room air as able. May have underlying chronic hypoxia in setting of OHS -Continue to wean oxygen and manage above problems -Will need sleep study  Diarrhea Appears resolved.  Elevated bilirubin Right upper quadrant ultrasound was obtained with concern for possible cirrhosis.  Moderate ascites noted as well.  Patient without any abdominal pain.  This is a new diagnosis.  No paracentesis performed to date.  No leukocytosis or confusion. Hyperbilirubinemia resolved.  Essential hypertension Patient started on BiDil and metoprolol and switched from metoprolol to Coreg -Continue BiDil and Coreg per cardiology recommendations  Acute gout Initially right knee flare and is s/p arthrocentesis with improvement of symptoms. He has now developed left elbow symptoms. X-ray negative. Symptoms resolved -Discontinue Prednisone -Voltaren gel  Morbid obesity Body mass index is 39.56 kg/m.    DVT prophylaxis: Heparin subcutaneous Code Status:   Code Status: Full Code Family Communication: None at bedside Disposition Plan: Discharge to SNF pending cardiology recommendations   Consultants:   Cardiology  Nephrology  Procedures:   TRANSTHORACIC ECHOCARDIOGRAM (09/20/2019) IMPRESSIONS    1. Severe global reduction in LV systolic function; moderate LVE; grade 2  diastolic dysfunction; biatrial enlargement; severe RV dysfunction; mild  TR with severe pulmonary hypertension.  2. Left ventricular ejection fraction, by estimation, is 20 to 25%. The  left ventricle has severely decreased function. The left ventricle  demonstrates  global hypokinesis. The left ventricular internal cavity size  was moderately dilated.  Left  ventricular diastolic parameters are consistent with Grade II diastolic  dysfunction (pseudonormalization). Elevated left atrial pressure.  3. Right ventricular systolic function is severely reduced. The right  ventricular size is normal. There is severely elevated pulmonary artery  systolic pressure.  4. Left atrial size was mildly dilated.  5. Right atrial size was moderately dilated.  6. The mitral valve is normal in structure. Trivial mitral valve  regurgitation. No evidence of mitral stenosis.  7. The aortic valve is tricuspid. Aortic valve regurgitation is not  visualized. No aortic stenosis is present.  8. The inferior vena cava is dilated in size with <50% respiratory  variability, suggesting right atrial pressure of 15 mmHg.    Antimicrobials:  None   Subjective: No issues overnight  Objective: Vitals:   10/01/19 1706 10/01/19 2010 10/02/19 0514 10/02/19 0727  BP: 125/83 126/74 (!) 138/93 (!) 152/91  Pulse:  81 98 86  Resp:  20 18 13   Temp:  98.4 F (36.9 C) 98 F (36.7 C) 99 F (37.2 C)  TempSrc:  Oral Oral Oral  SpO2:  95% 92% 99%  Weight:   136 kg   Height:        Intake/Output Summary (Last 24 hours) at 10/02/2019 1050 Last data filed at 10/02/2019 0913 Gross per 24 hour  Intake 910 ml  Output 1550 ml  Net -640 ml   Filed Weights   09/30/19 1819 10/01/19 0503 10/02/19 0514  Weight: (!) 137.7 kg (!) 137 kg 136 kg    Examination:  General exam: Appears calm and comfortable Respiratory system: Clear to auscultation. Respiratory effort normal. Cardiovascular system: S1 & S2 heard, RRR. No murmurs, rubs, gallops or clicks. Gastrointestinal system: Abdomen is protuberant, soft and nontender. Normal bowel sounds heard. Central nervous system: Alert and oriented. No focal neurological deficits. Extremities: LE edema. No calf tenderness Skin: No cyanosis. No rashes Psychiatry: Judgement and insight appear normal. Mood & affect appropriate.        Data Reviewed: I have personally reviewed following labs and imaging studies  CBC: Recent Labs  Lab 09/28/19 1237 09/28/19 1242 09/29/19 0752  WBC  --   --  6.1  HGB 16.3 16.0 13.8  HCT 48.0 47.0 43.5  MCV  --   --  88.8  PLT  --   --  323   Basic Metabolic Panel: Recent Labs  Lab 09/28/19 0447 09/28/19 1237 09/29/19 0505 09/30/19 0522 10/01/19 0522 10/01/19 1242 10/02/19 0513  NA 141   < > 142 140 137 136 136  K 4.5   < > 4.5 4.9 5.4* 4.7 5.2*  CL 96*  --  97* 96* 95* 95* 97*  CO2 35*  --  34* 36* 32 32 29  GLUCOSE 140*  --  126* 141* 123* 117* 135*  BUN 54*  --  51* 53* 56* 59* 59*  CREATININE 1.60*  --  1.52* 1.54* 1.67* 1.56* 1.61*  CALCIUM 9.1  --  9.1 9.1 9.2 9.2 9.1  PHOS 3.7  --  4.1 4.0 4.0  --  4.3   < > = values in this interval not displayed.   GFR: Estimated Creatinine Clearance: 69.7 mL/min (A) (by C-G formula based on SCr of 1.61 mg/dL (H)). Liver Function Tests: Recent Labs  Lab 09/28/19 0447 09/29/19 0505 09/30/19 0522 10/01/19 0522 10/02/19 0513  ALBUMIN 3.4* 3.1* 3.2* 3.4* 3.3*   No results for input(s):  LIPASE, AMYLASE in the last 168 hours. No results for input(s): AMMONIA in the last 168 hours. Coagulation Profile: No results for input(s): INR, PROTIME in the last 168 hours. Cardiac Enzymes: No results for input(s): CKTOTAL, CKMB, CKMBINDEX, TROPONINI in the last 168 hours. BNP (last 3 results) No results for input(s): PROBNP in the last 8760 hours. HbA1C: No results for input(s): HGBA1C in the last 72 hours. CBG: No results for input(s): GLUCAP in the last 168 hours. Lipid Profile: No results for input(s): CHOL, HDL, LDLCALC, TRIG, CHOLHDL, LDLDIRECT in the last 72 hours. Thyroid Function Tests: No results for input(s): TSH, T4TOTAL, FREET4, T3FREE, THYROIDAB in the last 72 hours. Anemia Panel: No results for input(s): VITAMINB12, FOLATE, FERRITIN, TIBC, IRON, RETICCTPCT in the last 72 hours. Sepsis Labs: No results for  input(s): PROCALCITON, LATICACIDVEN in the last 168 hours.  Recent Results (from the past 240 hour(s))  Body fluid culture     Status: None   Collection Time: 09/23/19  5:27 PM   Specimen: Synovial, Right Knee; Body Fluid  Result Value Ref Range Status   Specimen Description   Final    JOINT FLUID Performed at [facility] [address], [place], [place] [address]    Special Requests   Final    RIGHT KNEE Performed at [facility] [address] [place], [place] [address]    Gram Stain   Final    WBC PRESENT,BOTH PMN AND MONONUCLEAR NO ORGANISMS SEEN CALLED T.Delmar Landau  [id number] @2024  BY V.WILKINS Performed at [facility], [place] [address] [place], [place] [address]    Culture   Final    NO GROWTH 3 DAYS Performed at [facility], [place] [address], [place], [place] [address]    Report Status 09/27/2019 FINAL  Final         Radiology Studies: No results found.      Scheduled Meds: . acetaminophen  500 mg Oral BID  . allopurinol  100 mg Oral Daily  . carvedilol  12.5 mg Oral BID WC  . diclofenac Sodium  2 g Topical QID  . heparin  5,000 Units Subcutaneous Q8H  . isosorbide-hydrALAZINE  2 tablet Oral TID  . lidocaine  1 patch Transdermal Q24H  . polyethylene glycol  17 g Oral Daily  . predniSONE  40 mg Oral Q breakfast  . sodium chloride flush  3 mL Intravenous Q12H  . sodium chloride flush  3 mL Intravenous Q12H   Continuous Infusions: . sodium chloride    . sodium chloride       LOS: 12 days     Cordelia Poche, MD [facility] 10/02/2019, 10:50 AM  If 7PM-7AM, please contact night-coverage [email or web address]

## 2019-10-03 ENCOUNTER — Telehealth: Payer: Self-pay | Admitting: Physician Assistant

## 2019-10-03 ENCOUNTER — Other Ambulatory Visit: Payer: Self-pay | Admitting: Medical

## 2019-10-03 ENCOUNTER — Inpatient Hospital Stay (HOSPITAL_COMMUNITY): Payer: BLUE CROSS/BLUE SHIELD

## 2019-10-03 DIAGNOSIS — I5041 Acute combined systolic (congestive) and diastolic (congestive) heart failure: Secondary | ICD-10-CM

## 2019-10-03 DIAGNOSIS — N170 Acute kidney failure with tubular necrosis: Secondary | ICD-10-CM

## 2019-10-03 DIAGNOSIS — I5023 Acute on chronic systolic (congestive) heart failure: Secondary | ICD-10-CM

## 2019-10-03 LAB — RENAL FUNCTION PANEL
Albumin: 3.3 g/dL — ABNORMAL LOW (ref 3.5–5.0)
Anion gap: 8 (ref 5–15)
BUN: 55 mg/dL — ABNORMAL HIGH (ref 8–23)
CO2: 31 mmol/L (ref 22–32)
Calcium: 9 mg/dL (ref 8.9–10.3)
Chloride: 96 mmol/L — ABNORMAL LOW (ref 98–111)
Creatinine, Ser: 1.45 mg/dL — ABNORMAL HIGH (ref 0.61–1.24)
GFR calc Af Amer: 60 mL/min — ABNORMAL LOW (ref >60)
GFR calc non Af Amer: 52 mL/min — ABNORMAL LOW (ref >60)
Glucose, Bld: 129 mg/dL — ABNORMAL HIGH (ref 70–99)
Phosphorus: 4.3 mg/dL (ref 2.5–4.6)
Potassium: 5.5 mmol/L — ABNORMAL HIGH (ref 3.5–5.1)
Sodium: 135 mmol/L (ref 135–145)

## 2019-10-03 MED ORDER — ALLOPURINOL 100 MG PO TABS: 100.0000 mg | ORAL_TABLET | Freq: Every day | ORAL | Status: DC

## 2019-10-03 MED ORDER — DICLOFENAC SODIUM 1 % EX GEL: 2.0000 g | Freq: Four times a day (QID) | CUTANEOUS | Status: AC

## 2019-10-03 MED ORDER — FUROSEMIDE 40 MG PO TABS: 40.0000 mg | ORAL_TABLET | Freq: Every day | ORAL | Status: DC

## 2019-10-03 MED ORDER — FUROSEMIDE 40 MG PO TABS
40.0000 mg | ORAL_TABLET | Freq: Every day | ORAL | Status: DC
  Administered 2019-10-03: 40 mg via ORAL
  Filled 2019-10-03: qty 1

## 2019-10-03 MED ORDER — SODIUM ZIRCONIUM CYCLOSILICATE 10 G PO PACK
10.0000 g | PACK | Freq: Once | ORAL | Status: AC
  Administered 2019-10-03: 14:00:00 10 g via ORAL
  Filled 2019-10-03: qty 1

## 2019-10-03 MED ORDER — CARVEDILOL 25 MG PO TABS: 25.0000 mg | ORAL_TABLET | Freq: Two times a day (BID) | ORAL | Status: DC

## 2019-10-03 MED ORDER — ISOSORB DINITRATE-HYDRALAZINE 20-37.5 MG PO TABS: 2.0000 | ORAL_TABLET | Freq: Three times a day (TID) | ORAL | Status: DC

## 2019-10-03 MED ORDER — LIDOCAINE 5 % EX PTCH: 1.0000 | MEDICATED_PATCH | CUTANEOUS | 0 refills | Status: DC

## 2019-10-03 NOTE — TOC Progression Note (Signed)
Transition of Care [facility]) - Progression Note    Patient Details  Name: Deontrey Massi MRN: [id number] Date of Birth: 1955-10-26  Transition of Care [facility]) CM/SW Contact  Keniah Klemmer, Olegario Messier, RN Phone Number: 10/03/2019, 1:49 PM  Clinical Narrative:  Arneta Cliche d/c summary to [facility]-await response from [facility] for rm#, nsg report tel#.   Per patient request faxed info to [facility] rehab rep Stanton Kidney Smith-faxed clinicals fax#[phone]. Patient has auth, & a bed @ [facility]-rep Chris-no covid needed.    Expected Discharge Plan: Skilled Nursing Facility Barriers to Discharge: Continued Medical Work up  Expected Discharge Plan and Services Expected Discharge Plan: Skilled Nursing Facility   Discharge Planning Services: CM Consult   Living arrangements for the past 2 months: Single Family Home Expected Discharge Date: 10/03/19                                     Social Determinants of Health (SDOH) Interventions    Readmission Risk Interventions No flowsheet data found.

## 2019-10-03 NOTE — Discharge Summary (Signed)
Physician Discharge Summary   Patient ID: Dakarri Kessinger MRN: [id number] DOB/AGE: [birth year] 64 y.o.  Admit date: 09/19/2019 Discharge date: 10/03/2019  Primary Care Physician:  Patient, No Pcp Per   Recommendations for Outpatient Follow-up:  1. Follow up with PCP in 1-2 weeks 2. Please obtain BMP/CBC in one week   Home Health: Patient being discharged to skilled nursing facility Equipment/Devices:   Discharge Condition: stable CODE STATUS: FULL  Diet recommendation: Low-sodium heart healthy diet   Discharge Diagnoses:    Acute combined systolic and diastolic CHF Acute on chronic hypoxic and hypercarbic respiratory failure Nonischemic cardiomyopathy Acute kidney injury on CKD stage IIIa Severe pulmonary hypertension Morbid obesity Essential hypertension   Consults: Cardiology    Allergies:   Allergies  Allergen Reactions  . Morphine And Related     Patient reports "stomach tightness" in the past related to morphine use and does not remember what other reactions it caused.     DISCHARGE MEDICATIONS: Allergies as of 10/03/2019      Reactions   Morphine And Related    Patient reports "stomach tightness" in the past related to morphine use and does not remember what other reactions it caused.      Medication List    STOP taking these medications   hydrochlorothiazide 12.5 MG tablet Commonly known as: HYDRODIURIL   Norvasc 10 MG tablet Generic drug: amLODipine     TAKE these medications   allopurinol 100 MG tablet Commonly known as: ZYLOPRIM Take 1 tablet (100 mg total) by mouth daily. Start taking on: October 04, 2019   carvedilol 25 MG tablet Commonly known as: COREG Take 1 tablet (25 mg total) by mouth 2 (two) times daily with a meal.   co-enzyme Q-10 30 MG capsule Take 30 mg by mouth daily.   diclofenac Sodium 1 % Gel Commonly known as: VOLTAREN Apply 2 g topically 4 (four) times daily.   furosemide 40 MG tablet Commonly known as: LASIX Take 1  tablet (40 mg total) by mouth daily. Start taking on: October 04, 2019   Garlic 10 MG Caps Take 2 capsules by mouth in the morning and at bedtime.   isosorbide-hydrALAZINE 20-37.5 MG tablet Commonly known as: BIDIL Take 2 tablets by mouth 3 (three) times daily.   lidocaine 5 % Commonly known as: LIDODERM Place 1 patch onto the skin daily. Remove & Discard patch within 12 hours or as directed by MD Start taking on: October 04, 2019   Magnesium 500 MG Tabs Take 3 tablets by mouth daily.   PROBIOTIC PO Take 1 capsule by mouth daily.   TURMERIC PO Take 1 capsule by mouth every other day.   VITAMIN A PO Take 1 tablet by mouth every other day.   Vitamin B12 3000 MCG/ML Liqd Place 1 mL under the tongue daily.        Brief H and P: For complete details please refer to admission H and P, but in brief Bexton Haak is a 64 y.o. male with a history of morbid obesity. He presented secondary to shortness of breath on exertion in addition to peripheral edema.  He is found to have biventricular heart failure.  He was started on aggressive diuresis.  Cardiology on board and plan for heart cardiac cath on 4/2.  Nephrology was consulted secondary to kidney disease in setting of heart failure.   Hospital Course:   Acute combined systolic and diastolic CHF, new diagnosis -Patient presented with shortness of breath on exertion, peripheral edema.  Patient was started on IV aggressive diuresis -Cardiology was consulted, of 20 to 25%, severe global reduction in LV systolic function, grade 2 diastolic dysfunction, severely reduced right ventricular systolic function -Underwent cardiac catheterization without evidence of obstructive disease -Net -32 L, creatinine continues to improve, Lasix was briefly held and restarted 40 mg p.o. daily -Cardiology recommended Coreg 25 mg p.o. twice daily, BiDil 2 tabs 3 times daily, Lasix 40 mg daily, recommended renal panel in 1 week and outpatient follow-up with  cardiology.  Acute kidney injury on CKD stage IIIa -Creatinine now improving, creatinine 2.07 on admission, peak creatinine of 2.1. -Patient was placed on IV diuresis with Lasix, received 1 dose of acetazolamide for developing alkalosis. -Nephrology was consulted, Foley was placed due to urinary incontinence, removed on 3/30 -Nephrology signed off on 4/4, creatinine improving, 1.45 at the time of discharge.   Hyperkalemia Likely secondary to kidney disease. Mild. No symptoms. Received Lokelma in the past. Potassium 5.5, will give Lokelma, restarted on Lasix  Acute respiratory failure with hypoxia Chronic respiratory failure with hypercapnia Secondary to heart failure exacerbation initially.  Weaning oxygen to room air as able. May have underlying chronic hypoxia in setting of OHS -Will need a sleep study outpatient  Diarrhea Resolved  Elevated bilirubin, ascites Possibly due to passive congestion, has right ventricular failure.  Right upper quadrant ultrasound showed moderate ascites, no abdominal pain.  No paracentesis was performed.  Hyperbilirubinemia resolved  Essential hypertension Continue BiDil, Coreg, Lasix  Acute gout Initially right knee flare, status post arthrocentesis centesis with improvement of symptoms, now complaining of left elbow symptoms.  X-ray negative. Prednisone discontinued, continue Voltaren gel   Morbid obesity Body mass index is 39.56 kg/m.    Day of Discharge S: Complaining of left elbow pain, states he had hit his elbow.  No chest pain or shortness of breath.  BP 132/73 (BP Location: Right Arm)   Pulse 72   Temp 98 F (36.7 C) (Oral)   Resp 18   Ht 6\' 1"  (1.854 m)   Wt (!) 139 kg   SpO2 98%   BMI 40.43 kg/m   Physical Exam: General: Alert and awake oriented x3 not in any acute distress. HEENT: anicteric sclera, pupils reactive to light and accommodation CVS: S1-S2 clear no murmur rubs or gallops Chest: clear to auscultation  bilaterally, no wheezing rales or rhonchi Abdomen: soft nontender, nondistended, normal bowel sounds Extremities: no cyanosis, clubbing or edema noted bilaterally.  Range of movement normal with left arm Neuro: Cranial nerves II-XII intact, no focal neurological deficits    Get Medicines reviewed and adjusted: Please take all your medications with you for your next visit with your Primary MD  Please request your Primary MD to go over all hospital tests and procedure/radiological results at the follow up. Please ask your Primary MD to get all Hospital records sent to his/her office.  If you experience worsening of your admission symptoms, develop shortness of breath, life threatening emergency, suicidal or homicidal thoughts you must seek medical attention immediately by calling 911 or calling your MD immediately  if symptoms less severe.  You must read complete instructions/literature along with all the possible adverse reactions/side effects for all the Medicines you take and that have been prescribed to you. Take any new Medicines after you have completely understood and accept all the possible adverse reactions/side effects.   Do not drive when taking pain medications.   Do not take more than prescribed Pain, Sleep and Anxiety Medications  Special Instructions: If you have smoked or chewed Tobacco  in the last 2 yrs please stop smoking, stop any regular Alcohol  and or any Recreational drug use.  Wear Seat belts while driving.  Please note  You were cared for by a hospitalist during your hospital stay. Once you are discharged, your primary care physician will handle any further medical issues. Please note that NO REFILLS for any discharge medications will be authorized once you are discharged, as it is imperative that you return to your primary care physician (or establish a relationship with a primary care physician if you do not have one) for your aftercare needs so that they can  reassess your need for medications and monitor your lab values.   The results of significant diagnostics from this hospitalization (including imaging, microbiology, ancillary and laboratory) are listed below for reference.      Procedures/Studies:  DG Elbow 2 Views Left  Result Date: 10/03/2019 CLINICAL DATA:  Struck left elbow. Posterior elbow pain. EXAM: LEFT ELBOW - 2 VIEW COMPARISON:  09/30/2019 FINDINGS: The joint spaces are maintained. No acute fracture is identified. No elbow joint effusion. IMPRESSION: No acute bony findings. Electronically Signed   By: Rudie Meyer M.D.   On: 10/03/2019 10:15   DG ELBOW COMPLETE LEFT (3+VIEW)  Result Date: 09/30/2019 CLINICAL DATA:  Elbow pain for 3 days after having fluid drained. EXAM: LEFT ELBOW - COMPLETE 3+ VIEW COMPARISON:  None. FINDINGS: No fracture. Small supracondylar process the medial distal humeral metaphysis. No concerning bone lesion. Elbow joint normally spaced and aligned. No arthropathic changes. No joint effusion. Soft tissues are unremarkable. IMPRESSION: No fracture, joint abnormality or joint effusion. Electronically Signed   By: Amie Portland M.D.   On: 09/30/2019 10:17   DG Knee 1-2 Views Right  Result Date: 09/22/2019 CLINICAL DATA:  Right knee pain and swelling EXAM: RIGHT KNEE - 1-2 VIEW COMPARISON:  None. FINDINGS: Frontal and lateral views of the right knee demonstrate moderate joint effusion. There is 3 compartment osteoarthritis greatest in the lateral and patellofemoral compartments, moderate in severity. No fracture, subluxation, or dislocation. Diffuse subcutaneous edema. IMPRESSION: 1. Moderate 3 compartment osteoarthritis. 2. Moderate knee effusion. 3. Diffuse subcutaneous edema. Electronically Signed   By: Sharlet Salina M.D.   On: 09/22/2019 15:07   CARDIAC CATHETERIZATION  Result Date: 09/28/2019  LV end diastolic pressure is moderately elevated.  Hemodynamic findings consistent with severe pulmonary  hypertension.  1. Normal coronary anatomy 2. Moderately elevated LV filling pressures. 3. Severely elevated right heart filling pressures 4. Severe pulmonary HTN 5. Preserved cardiac output 6. Chronic severe respiratory failure. PH 7.36, PCO2 71, Bicarb 40. P02 152. Suspect chronic hypoventilation.   US RENAL  Result Date: 09/21/2019 CLINICAL DATA:  Acute renal failure EXAM: RENAL / URINARY TRACT ULTRASOUND COMPLETE COMPARISON:  None. FINDINGS: Right Kidney: Renal measurements: 10.5 x 5.2 x 5.9 cm = volume: 167 mL . Echogenicity within normal limits. No mass or hydronephrosis visualized. Left Kidney: Renal measurements: 11.7 x 4.8 x 5.5 cm = volume: 164 mL. Echogenicity within normal limits. No mass or hydronephrosis visualized. Bladder: Appears normal for degree of bladder distention. Other: Ascites seen in the abdomen and pelvis. IMPRESSION: No acute findings.  No hydronephrosis. Ascites. Electronically Signed   By: Charlett Nose M.D.   On: 09/21/2019 02:35   DG Chest Portable 1 View  Result Date: 09/19/2019 CLINICAL DATA:  Fluid retention, edema EXAM: PORTABLE CHEST 1 VIEW COMPARISON:  None FINDINGS: Cardiomegaly. Right lower lobe  airspace opacity with probable layering effusion. No confluent opacity on the left. No acute bony abnormality. IMPRESSION: Layering right pleural effusion with right lower lobe airspace opacity which could reflect atelectasis or pneumonia. Cardiomegaly. Electronically Signed   By: Charlett Nose M.D.   On: 09/19/2019 21:33   ECHOCARDIOGRAM COMPLETE  Result Date: 09/20/2019    ECHOCARDIOGRAM REPORT   Patient Name:   JAQUELLE ZAMBRANO Date of Exam: 09/20/2019 Medical Rec #:  [id number]   Height: Accession #:    [id number]  Weight: Date of Birth:  [birth year]   BSA: Patient Age:    63 years    BP:           142/81 mmHg Patient Gender: M           HR:           94 bpm. Exam Location:  Inpatient Procedure: 2D Echo, Cardiac Doppler and Color Doppler Indications:    CHF-Acute Systolic 428.21   History:        Patient has no prior history of Echocardiogram examinations.                 CHF; Risk Factors:Hypertension and Former Smoker.  Sonographer:    Renella Cunas RDCS Referring Phys: [id number] VASUNDHRA RATHORE IMPRESSIONS  1. Severe global reduction in LV systolic function; moderate LVE; grade 2 diastolic dysfunction; biatrial enlargement; severe RV dysfunction; mild TR with severe pulmonary hypertension.  2. Left ventricular ejection fraction, by estimation, is 20 to 25%. The left ventricle has severely decreased function. The left ventricle demonstrates global hypokinesis. The left ventricular internal cavity size was moderately dilated. Left ventricular diastolic parameters are consistent with Grade II diastolic dysfunction (pseudonormalization). Elevated left atrial pressure.  3. Right ventricular systolic function is severely reduced. The right ventricular size is normal. There is severely elevated pulmonary artery systolic pressure.  4. Left atrial size was mildly dilated.  5. Right atrial size was moderately dilated.  6. The mitral valve is normal in structure. Trivial mitral valve regurgitation. No evidence of mitral stenosis.  7. The aortic valve is tricuspid. Aortic valve regurgitation is not visualized. No aortic stenosis is present.  8. The inferior vena cava is dilated in size with <50% respiratory variability, suggesting right atrial pressure of 15 mmHg. FINDINGS  Left Ventricle: Left ventricular ejection fraction, by estimation, is 20 to 25%. The left ventricle has severely decreased function. The left ventricle demonstrates global hypokinesis. The left ventricular internal cavity size was moderately dilated. There is no left ventricular hypertrophy. Left ventricular diastolic parameters are consistent with Grade II diastolic dysfunction (pseudonormalization). Elevated left atrial pressure. Right Ventricle: The right ventricular size is normal.Right ventricular systolic function is severely  reduced. There is severely elevated pulmonary artery systolic pressure. The tricuspid regurgitant velocity is 3.71 m/s, and with an assumed right atrial  pressure of 15 mmHg, the estimated right ventricular systolic pressure is 70.1 mmHg. Left Atrium: Left atrial size was mildly dilated. Right Atrium: Right atrial size was moderately dilated. Pericardium: There is no evidence of pericardial effusion. Mitral Valve: The mitral valve is normal in structure. Normal mobility of the mitral valve leaflets. Trivial mitral valve regurgitation. No evidence of mitral valve stenosis. Tricuspid Valve: The tricuspid valve is normal in structure. Tricuspid valve regurgitation is mild . No evidence of tricuspid stenosis. Aortic Valve: The aortic valve is tricuspid. Aortic valve regurgitation is not visualized. No aortic stenosis is present. Pulmonic Valve: The pulmonic valve was normal in structure. Pulmonic valve regurgitation is  mild. No evidence of pulmonic stenosis. Aorta: The aortic root is normal in size and structure. Venous: The inferior vena cava is dilated in size with less than 50% respiratory variability, suggesting right atrial pressure of 15 mmHg. IAS/Shunts: There is left bowing of the interatrial septum, suggestive of elevated right atrial pressure. No atrial level shunt detected by color flow Doppler. Additional Comments: Severe global reduction in LV systolic function; moderate LVE; grade 2 diastolic dysfunction; biatrial enlargement; severe RV dysfunction; mild TR with severe pulmonary hypertension. There is pleural effusion in the left lateral region.  LEFT VENTRICLE PLAX 2D LVIDd:         6.00 cm      Diastology LVIDs:         5.50 cm      LV e' lateral:   4.68 cm/s LV PW:         0.90 cm      LV E/e' lateral: 17.4 LV IVS:        0.90 cm      LV e' medial:    4.19 cm/s LVOT diam:     2.10 cm      LV E/e' medial:  19.4 LV SV:         43 LVOT Area:     3.46 cm  LV Volumes (MOD) LV vol d, MOD A2C: 175.0 ml LV vol  d, MOD A4C: 198.0 ml LV vol s, MOD A2C: 150.0 ml LV vol s, MOD A4C: 148.0 ml LV SV MOD A2C:     25.0 ml LV SV MOD A4C:     198.0 ml LV SV MOD BP:      44.3 ml RIGHT VENTRICLE RV S prime:     9.65 cm/s TAPSE (M-mode): 1.3 cm LEFT ATRIUM             RIGHT ATRIUM LA diam:        5.30 cm RA Area:     30.10 cm LA Vol (A2C):   62.5 ml RA Volume:   106.00 ml LA Vol (A4C):   30.2 ml LA Biplane Vol: 46.3 ml  AORTIC VALVE LVOT Vmax:   80.90 cm/s LVOT Vmean:  50.300 cm/s LVOT VTI:    0.123 m  AORTA Ao Root diam: 3.10 cm MITRAL VALVE               TRICUSPID VALVE MV Area (PHT): 7.16 cm    TR Peak grad:   55.1 mmHg MV Decel Time: 106 msec    TR Vmax:        371.00 cm/s MV E velocity: 81.40 cm/s MV A velocity: 36.80 cm/s  SHUNTS MV E/A ratio:  2.21        Systemic VTI:  0.12 m                            Systemic Diam: 2.10 cm Kirk Ruths MD Electronically signed by Kirk Ruths MD Signature Date/Time: 09/20/2019/1:00:43 PM    Final    US Abdomen Limited RUQ  Result Date: 09/20/2019 CLINICAL DATA:  Elevated LFTs. EXAM: ULTRASOUND ABDOMEN LIMITED RIGHT UPPER QUADRANT COMPARISON:  No prior. FINDINGS: Gallbladder: Not visualized Common bile duct: Diameter: Not visualized Liver: Nodular hepatic contour suggesting the possibility of cirrhosis. No definite mass noted. Portal vein not well visualized. Other: Moderate ascites. This exam was extremely limited due to patient's body habitus and overlying bowel gas. IMPRESSION: 1. Very limited exam due to patient's  body habitus and overlying bowel gas. Gallbladder and common bile duct not visualized. 2. Very nodular hepatic contour suggesting cirrhosis. Moderate ascites. Electronically Signed   By: Maisie Fus  Register   On: 09/20/2019 07:35       LAB RESULTS: Basic Metabolic Panel: Recent Labs  Lab 10/02/19 0513 10/02/19 0513 10/03/19 0517  NA 136  --  135  K 5.2*  --  5.5*  CL 97*  --  96*  CO2 29  --  31  GLUCOSE 135*  --  129*  BUN 59*  --  55*  CREATININE 1.61*   --  1.45*  CALCIUM 9.1  --  9.0  PHOS 4.3   < > 4.3   < > = values in this interval not displayed.   Liver Function Tests: Recent Labs  Lab 10/02/19 0513 10/03/19 0517  ALBUMIN 3.3* 3.3*   No results for input(s): LIPASE, AMYLASE in the last 168 hours. No results for input(s): AMMONIA in the last 168 hours. CBC: Recent Labs  Lab 09/28/19 1242 09/29/19 0752  WBC  --  6.1  HGB 16.0 13.8  HCT 47.0 43.5  MCV  --  88.8  PLT  --  323   Cardiac Enzymes: No results for input(s): CKTOTAL, CKMB, CKMBINDEX, TROPONINI in the last 168 hours. BNP: Invalid input(s): POCBNP CBG: No results for input(s): GLUCAP in the last 168 hours.     Disposition and Follow-up: Discharge Instructions    (HEART FAILURE PATIENTS) Call MD:  Anytime you have any of the following symptoms: 1) 3 pound weight gain in 24 hours or 5 pounds in 1 week 2) shortness of breath, with or without a dry hacking cough 3) swelling in the hands, feet or stomach 4) if you have to sleep on extra pillows at night in order to breathe.   Complete by: As directed    Diet - low sodium heart healthy   Complete by: As directed    Increase activity slowly   Complete by: As directed        DISPOSITION: Skilled nursing facility   DISCHARGE FOLLOW-UP Follow-up Information    Primary care physician. Schedule an appointment as soon as possible for a visit.        [facility] Follow up on 10/10/2019.   Specialty: Cardiology Why: Blood work at [facility] 7:30 AM to 4:30 PM Contact information: [address], [address] [place] [address] [phone]       Laurann Montana, PA-C Follow up on 10/17/2019.   Specialties: Cardiology, Radiology Why: hospital follow-up 4/21 at 2:15 PM Contact information: [address] [place] [address] [phone]            Time coordinating discharge:    Signed:   Thad Ranger M.D. [facility] 10/03/2019, 1:41 PM

## 2019-10-03 NOTE — Telephone Encounter (Signed)
New Message  Per Cadence, TOC Visit on 10/17/19 at 2:15 pm with Ronie Spies.

## 2019-10-03 NOTE — TOC Transition Note (Signed)
Transition of Care [facility]) - CM/SW Discharge Note   Patient Details  Name: Jack Chavez MRN: [id number] Date of Birth: 1955-09-17  Transition of Care [facility]) CM/SW Contact:  Lanier Clam, RN Phone Number: 10/03/2019, 2:31 PM   Clinical Narrative: [facility],going to rm 518B,nsg call report to [phone], [address] nurse-scheduled for [facility] 3:30p pick up. No further CM needs.      Final next level of care: Skilled Nursing Facility Barriers to Discharge: No Barriers Identified   Patient Goals and CMS Choice Patient states their goals for this hospitalization and ongoing recovery are:: go to rehab CMS Medicare.gov Compare Post Acute Care list provided to:: Patient Choice offered to / list presented to : Patient  Discharge Placement PASRR number recieved: 09/27/19            Patient chooses bed at: [facility] Patient to be transferred to facility by: [facility] Name of family member notified: patient Patient and family notified of of transfer: 10/03/19  Discharge Plan and Services   Discharge Planning Services: CM Consult                                 Social Determinants of Health (SDOH) Interventions     Readmission Risk Interventions No flowsheet data found.

## 2019-10-03 NOTE — Progress Notes (Signed)
Physical Therapy Treatment Patient Details Name: Jack Chavez MRN: [id number] DOB: [birth year] Today's Date: 10/03/2019    History of Present Illness 64 year old with past medical history significant for hypertension morbid obesity who presents complaining of worsening shortness of breath on exertion and peripheral edema. Acute dx of Acute exacerbation of CHF and R knee pain.    PT Comments    Pt ambulated in hallway and improving with mobility however continues to report L elbow pain and mild right knee pain.    Follow Up Recommendations  SNF     Equipment Recommendations  Rolling walker with 5" wheels    Recommendations for Other Services       Precautions / Restrictions Precautions Precautions: Fall Precaution Comments: monitor O2 stats    Mobility  Bed Mobility Overal bed mobility: Needs Assistance Bed Mobility: Supine to Sit     Supine to sit: Min guard;HOB elevated     General bed mobility comments: increased time and effort  Transfers Overall transfer level: Needs assistance Equipment used: Rolling walker (2 wheeled) Transfers: Sit to/from Stand Sit to Stand: Min guard;From elevated surface         General transfer comment: pt with difficulty using L UE to self assist due to elbow pain so elevated bed (also with right knee pain)  Ambulation/Gait Ambulation/Gait assistance: Min guard Gait Distance (Feet): 80 Feet Assistive device: Rolling walker (2 wheeled) Gait Pattern/deviations: Step-through pattern;Decreased stride length Gait velocity: decreased   General Gait Details: lowered RW for pt to be able to have full elbow extension for better pain control; SPO2 monitored  however continuous pulse monitor on finger falling off; Spo2 86% however with different monitor 96% during ambulation on 2L O2 (RN aware)   Stairs             Wheelchair Mobility    Modified Rankin (Stroke Patients Only)       Balance                                             Cognition Arousal/Alertness: Awake/alert Behavior During Therapy: WFL for tasks assessed/performed Overall Cognitive Status: Within Functional Limits for tasks assessed                                        Exercises      General Comments        Pertinent Vitals/Pain Pain Assessment: Faces Faces Pain Scale: Hurts little more Pain Location: left median lower arm numbness due to inner elbow pain Pain Descriptors / Indicators: Numbness    Home Living                      Prior Function            PT Goals (current goals can now be found in the care plan section) Progress towards PT goals: Progressing toward goals    Frequency    Min 3X/week      PT Plan Current plan remains appropriate    Co-evaluation              AM-PAC PT "6 Clicks" Mobility   Outcome Measure  Help needed turning from your back to your side while in a flat bed without using bedrails?: A Little Help needed moving  from lying on your back to sitting on the side of a flat bed without using bedrails?: A Little Help needed moving to and from a bed to a chair (including a wheelchair)?: A Little Help needed standing up from a chair using your arms (e.g., wheelchair or bedside chair)?: A Lot Help needed to walk in hospital room?: A Lot Help needed climbing 3-5 steps with a railing? : Total 6 Click Score: 14    End of Session Equipment Utilized During Treatment: Gait belt;Oxygen Activity Tolerance: Patient tolerated treatment well Patient left: in chair;with call bell/phone within reach;with chair alarm set;with nursing/sitter in room Nurse Communication: Mobility status PT Visit Diagnosis: Muscle weakness (generalized) (M62.81);Other abnormalities of gait and mobility (R26.89)     Time: 8675-4492 PT Time Calculation (min) (ACUTE ONLY): 21 min  Charges:  $Gait Training: 8-22 mins            Jack Chavez, DPT Acute Rehabilitation  Services Office: [phone]  York Ram E 10/03/2019, 1:41 PM

## 2019-10-03 NOTE — Progress Notes (Signed)
Progress Note  Patient Name: Gaylin Bulthuis Date of Encounter: 10/03/2019  Primary Cardiologist: Verne Carrow, MD   Subjective   Patient out out 2L urine overnight although weight is slightly up today. No chest pain. Patient trying to get up and about the room more. Still on supplemental O2.  Inpatient Medications    Scheduled Meds: . acetaminophen  500 mg Oral BID  . allopurinol  100 mg Oral Daily  . carvedilol  25 mg Oral BID WC  . diclofenac Sodium  2 g Topical QID  . heparin  5,000 Units Subcutaneous Q8H  . isosorbide-hydrALAZINE  2 tablet Oral TID  . lidocaine  1 patch Transdermal Q24H  . polyethylene glycol  17 g Oral Daily  . sodium chloride flush  3 mL Intravenous Q12H  . sodium chloride flush  3 mL Intravenous Q12H   Continuous Infusions: . sodium chloride    . sodium chloride     PRN Meds: sodium chloride, sodium chloride, acetaminophen, sodium chloride flush, sodium chloride flush   Vital Signs    Vitals:   10/02/19 1100 10/02/19 1330 10/02/19 2105 10/03/19 0759  BP: (!) 149/103 124/67 139/81 129/78  Pulse: 100 100 76 94  Resp:   18 18  Temp:  97.6 F (36.4 C) 98.6 F (37 C) 98.1 F (36.7 C)  TempSrc:  Oral Oral Oral  SpO2:  98% 98% 98%  Weight:    (!) 139 kg  Height:        Intake/Output Summary (Last 24 hours) at 10/03/2019 5643 Last data filed at 10/03/2019 0500 Gross per 24 hour  Intake 910 ml  Output 2000 ml  Net -1090 ml   Last 3 Weights 10/03/2019 10/02/2019 10/01/2019  Weight (lbs) 306 lb 7 oz 299 lb 13.2 oz 302 lb 0.5 oz  Weight (kg) 139 kg 136 kg 137 kg      Telemetry    NSR HR 70-80s; transient first degree AV block - Personally Reviewed  ECG    No new - Personally Reviewed  Physical Exam   GEN: No acute distress.   Neck: No JVD Cardiac: RRR, no murmurs, rubs, or gallops.  Respiratory: Clear to auscultation bilaterally; 2L O2 GI: Soft, nontender, non-distended  MS: Trace edema; No deformity. Neuro:  Nonfocal  Psych:  Normal affect   Labs    High Sensitivity Troponin:  No results for input(s): TROPONINIHS in the last 720 hours.    Chemistry Recent Labs  Lab 10/01/19 0522 10/01/19 0522 10/01/19 1242 10/02/19 0513 10/03/19 0517  NA 137   < > 136 136 135  K 5.4*   < > 4.7 5.2* 5.5*  CL 95*   < > 95* 97* 96*  CO2 32   < > 32 29 31  GLUCOSE 123*   < > 117* 135* 129*  BUN 56*   < > 59* 59* 55*  CREATININE 1.67*   < > 1.56* 1.61* 1.45*  CALCIUM 9.2   < > 9.2 9.1 9.0  ALBUMIN 3.4*  --   --  3.3* 3.3*  GFRNONAA 44*   < > 47* 45* 52*  GFRAA 50*   < > 55* 53* 60*  ANIONGAP 10   < > 9 10 8    < > = values in this interval not displayed.     Hematology Recent Labs  Lab 09/28/19 1237 09/28/19 1242 09/29/19 0752  WBC  --   --  6.1  RBC  --   --  4.90  HGB 16.3 16.0 13.8  HCT 48.0 47.0 43.5  MCV  --   --  88.8  MCH  --   --  28.2  MCHC  --   --  31.7  RDW  --   --  15.3  PLT  --   --  323    BNPNo results for input(s): BNP, PROBNP in the last 168 hours.   DDimer No results for input(s): DDIMER in the last 168 hours.   Radiology    No results found.  Cardiac Studies   Echo 09/20/19 1. Severe global reduction in LV systolic function; moderate LVE; grade 2  diastolic dysfunction; biatrial enlargement; severe RV dysfunction; mild  TR with severe pulmonary hypertension.  2. Left ventricular ejection fraction, by estimation, is 20 to 25%. The  left ventricle has severely decreased function. The left ventricle  demonstrates global hypokinesis. The left ventricular internal cavity size  was moderately dilated. Left  ventricular diastolic parameters are consistent with Grade II diastolic  dysfunction (pseudonormalization). Elevated left atrial pressure.  3. Right ventricular systolic function is severely reduced. The right  ventricular size is normal. There is severely elevated pulmonary artery  systolic pressure.  4. Left atrial size was mildly dilated.  5. Right atrial size was  moderately dilated.  6. The mitral valve is normal in structure. Trivial mitral valve  regurgitation. No evidence of mitral stenosis.  7. The aortic valve is tricuspid. Aortic valve regurgitation is not  visualized. No aortic stenosis is present.  8. The inferior vena cava is dilated in size with <50% respiratory  variability, suggesting right atrial pressure of 15 mmHg.   Cardiac cath 09/28/19  LV end diastolic pressure is moderately elevated.  Hemodynamic findings consistent with severe pulmonary hypertension.  1. Normal coronary anatomy 2. Moderately elevated LV filling pressures. 3. Severely elevated right heart filling pressures 4. Severe pulmonary HTN 5. Preserved cardiac output 6. Chronic severe respiratory failure. PH 7.36, PCO2 71, Bicarb 40. P02 152. Suspect chronic hypoventilation.  Patient Profile     64 y.o. male pmh of HTN, systolic CHF, arthritis who is being evaluated for new onset systolic CHF.  Assessment & Plan    Acutecombinedsystolicand distolicCHF - New onset this admission - EF 20-25% with G2DD; echo showed generalized hypokinesis -cath 4/2 showed normal cors and moderately elevated filling pressures, severe Pulmonary HTN - Has been on IV diuretics. Last dose of diuretics was 4/4. Held for rising creatinine and stable volume status. - Has put out net total 32L since admission and weight down 375>306lbs -creatinine 1.54 >1.67>1.61>1.45. Volume status is slightly up with trace edema on exam. Weight up today. Would start oral lasix today.  - continue to try and wean O2 - MD to see  Nonischemic CM - thought to be secondary to HTN - BiDil 2 tabs TID.  - ARB/Entresto held for kidney function but can consider if renal function is stable - continue Coreg>>increased to 25mg  BID  AKI/CKD stage 3 - on admission creatinine was 2.07 - Creatininebaseline around1.5 - has been improving with diuresis, Cr 1.61>1.45  Severe Pulmonary HTN - likely  secondary to obesity hypoventilation syndrome, sleep apnea - will need sleep study after discharge  Morbid obesity - recommend weight loss  HTN - Bidil 2tabsTID - coreg increased to 25mg  BID - pressures better controlled   For questions or updates, please contact [facility] Please consult [email or web address] for contact info under        Signed, Cintya Daughety , PA-C  10/03/2019, 8:22 AM

## 2019-10-03 NOTE — TOC Transition Note (Signed)
Transition of Care [facility]) - CM/SW Discharge Note   Patient Details  Name: Chistian Kasler MRN: [id number] Date of Birth: 1955-10-18  Transition of [facility]) CM/SW Contact:  Lanier Clam, RN Phone Number: 10/03/2019, 1:57 PM   Clinical Narrative: Per patient request faxed clinicals to [facility] rep Carron Brazen aware, & reviewing. [facility] rep Thayer Ohm has auth,& a bed, no covid needed. Noted attending will d/c today. Informed patient that he can continue to follow up w/[facility] for SNF if needed while @ [facility]-patient voiced understanding. Asked patient if he will inform his sister, & mother of his d/c plan to [facility] said he will. Will fax d/c summary to [facility], & await rm,tel# for nsg report,then call [facility].       Final next level of care: Skilled Nursing Facility Barriers to Discharge: No Barriers Identified   Patient Goals and CMS Choice Patient states their goals for this hospitalization and ongoing recovery are:: go to rehab CMS Medicare.gov Compare Post Acute Care list provided to:: Patient Choice offered to / list presented to : Patient  Discharge Placement PASRR number recieved: 09/27/19            Patient chooses bed at: [facility] Patient to be transferred to facility by: [facility] Name of family member notified: patient Patient and family notified of of transfer: 10/03/19  Discharge Plan and Services   Discharge Planning Services: CM Consult                                 Social Determinants of Health (SDOH) Interventions     Readmission Risk Interventions No flowsheet data found.

## 2019-10-04 NOTE — Telephone Encounter (Signed)
1st TCM Call attempt: No answer so I left a message on the pts VM asking him to call us back at [phone] and to ask to s/w a triage nurse concerning a TCM Call.Marland Kitchen

## 2019-10-05 NOTE — Telephone Encounter (Signed)
Patient discharged to skilled nursing facility.

## 2019-10-08 ENCOUNTER — Telehealth: Payer: Self-pay | Admitting: Cardiovascular Disease

## 2019-10-08 ENCOUNTER — Telehealth: Payer: Self-pay

## 2019-10-08 NOTE — Telephone Encounter (Signed)
Patient contacted regarding discharge from [facility] on 10/03/19.  Patient understands to follow up with provider Ronie Spies on 10/17/19 at 2:15 at [facility]. Patient understands discharge instructions? yes Patient understands medications and regiment? yes Patient understands to bring all medications to this visit? yes  Ask patient:  Are you enrolled in My Chart (yes or no)  If no ask patient if they would like to enroll.    For nonsurgery patients please delete the surgery questions.

## 2019-10-08 NOTE — Telephone Encounter (Signed)
Jack Chavez with [facility] in [place], [place] is requesting to have orders for blood work faxed over to [phone] (attention: Jack Chavez). Please return call to confirm.

## 2019-10-08 NOTE — Telephone Encounter (Signed)
Spoke with Kathie Rhodes at the [facility] in [place].  She wanted to confirm what labs were needed per the DC papers they received.  The patient will have a BMET drawn at the facility and results will be faxed to [phone].  Attn: Dayna. The patient had high potassium on last hospital lab.  Per Kathie Rhodes the facility has drawn metabolic panels since he has been there and she does know the results off hand but potassium is still high.   She will fax all of their recent results for patient when Wednesday's bmet results are back.  Lab appointment at [facility] lab cancelled.

## 2019-10-10 ENCOUNTER — Other Ambulatory Visit: Payer: BLUE CROSS/BLUE SHIELD

## 2019-10-16 NOTE — Progress Notes (Signed)
Cardiology Office Note   Date:  10/18/2019   ID:  Jack Chavez, DOB March 12, 1956, MRN [id number]  PCP:  Patient, No Pcp Per  Cardiologist:  Dr. Clifton James, MD   Chief Complaint  Patient presents with  . Hospitalization Follow-up      History of Present Illness: Jack Chavez is a 64 y.o. male who presents for hospital follow-up, seen for Dr. Clifton James.  Jack Chavez and has a history of hypertension, systolic CHF and arthritis who was recently hospitalized and seen by cardiology service for new onset systolic CHF exacerbation.  He presented to [facility] on 09/21/2019 with epigastric indigestion followed by by progressive lower extremity edema, ascites and dyspnea. He's had chronic orthopnea ever since having back issues. Workup in the hospital revealed new onset kidney insufficiency of unknown duration, mild hyperkalemia, cirrhotic changes of the liver on [place], ascites, and biventricular heart failure with severe pulmonary HTN. 2D echo showed EF 20-25%, global HK, moderately dilated LV, grade 2 DD, severely reduced RV function with severe pulmonary THN, mild LAE, moderate RAE, dilated IVC. He received Lokelma for his K of 5.4. BNP 1446, TSH wnl, Covid and HIV negative.  He then underwent R/LHC on 09/28/2019 which showed normal cors. Plan at hospital discharge was for Lasix 40 mg p.o. daily, BiDil 2 tabs 3 times daily, no ARB at this time however if renal function remains stable then consider Entresto, carvedilol 25 twice daily, cath with normal course, outpatient sleep study given pulmonary hypertension  Today Jack Chavez presents with his sister from SNF. He has been doing well since hospital discharge. Reports his PT has been interrupted by an episode of gout. He states he takes allopurinol and colchicine and the facility ran out of his medication which prompted his flare. As above, her had issues with CKD during his course, limiting medication titration. He comes today with paperwork from the SNF. Looks  like recheck BMET shows a creatinine of 1.42. He states that his BP was dropping in the afternoons. Somewhere he was placed on amlodipine>>we will discontinue this.  He looks good on exam today He denies SOB, LE edema, chest pain, palpitations, dizziness or syncope. Continues to sleep with pillows but states this has not changed. His weight has been stable. Overall looks good.   Past Medical History:  Diagnosis Date  . HTN (hypertension)   . Morbid obesity (HCC)     Past Surgical History:  Procedure Laterality Date  . KNEE SURGERY    . RIGHT/LEFT HEART CATH AND CORONARY ANGIOGRAPHY N/A 09/28/2019   Procedure: RIGHT/LEFT HEART CATH AND CORONARY ANGIOGRAPHY;  Surgeon: Swazil[place], Peter M, MD;  Location: [facility] INVASIVE CV LAB;  Service: Cardiovascular;  Laterality: N/A;  . SHOULDER SURGERY       Current Outpatient Medications  Medication Sig Dispense Refill  . allopurinol (ZYLOPRIM) 100 MG tablet Take 1 tablet (100 mg total) by mouth daily.    . carvedilol (COREG) 25 MG tablet Take 1 tablet (25 mg total) by mouth 2 (two) times daily with a meal.    . co-enzyme Q-10 30 MG capsule Take 30 mg by mouth daily.    . Cyanocobalamin (VITAMIN B12) 3000 MCG/ML LIQD Place 1 mL under the tongue daily.    . diclofenac Sodium (VOLTAREN) 1 % GEL Apply 2 g topically 4 (four) times daily.    . furosemide (LASIX) 40 MG tablet Take 1 tablet (40 mg total) by mouth daily. 30 tablet   . Garlic 10 MG  CAPS Take 2 capsules by mouth in the morning and at bedtime.    . isosorbide-hydrALAZINE (BIDIL) 20-37.5 MG tablet Take 2 tablets by mouth 3 (three) times daily.    Marland Kitchen lidocaine (LIDODERM) 5 % Place 1 patch onto the skin daily. Remove & Discard patch within 12 hours or as directed by MD 30 patch 0  . Magnesium 500 MG TABS Take 3 tablets by mouth daily.    . Probiotic Product (PROBIOTIC PO) Take 1 capsule by mouth daily.    . TURMERIC PO Take 1 capsule by mouth every other day.    Marland Kitchen VITAMIN A PO Take 1 tablet by mouth every  other day.     No current facility-administered medications for this visit.    Allergies:   Morphine and related    Social History:  The patient  reports that he has quit smoking. His smoking use included cigars. He has never used smokeless tobacco. He reports current alcohol use. He reports that he does not use drugs.   Family History:  The patient's family history includes Diabetes in his father and paternal grandmother.    ROS:  Please see the history of present illness. Otherwise, review of systems are positive for none.  All other systems are reviewed and negative.    PHYSICAL EXAM: VS:  There were no vitals taken for this visit. , BMI There is no height or weight on file to calculate BMI.   General: Overweight, NAD Neck: Negative for carotid bruits. No JVD Lungs:Clear to ausculation bilaterally. No wheezes, rales, or rhonchi. Breathing is unlabored. Cardiovascular: RRR with S1 S2. No murmurs Extremities: Mild BLE edema. Radial pulses 2+ bilaterally Neuro: Alert and oriented. No focal deficits. No facial asymmetry. MAE spontaneously. Psych: Responds to questions appropriately with normal affect.     EKG:  EKG is not ordered today.   Recent Labs: 09/19/2019: B Natriuretic Peptide 1,446.6 09/21/2019: TSH 0.710 09/24/2019: ALT 12 09/25/2019: Magnesium 2.0 09/29/2019: Hemoglobin 13.8; Platelets 323 10/03/2019: BUN 55; Creatinine, Ser 1.45; Potassium 5.5; Sodium 135   Lipid Panel    Component Value Date/Time   CHOL 150 09/22/2019 0556   TRIG 81 09/22/2019 0556   HDL 33 (L) 09/22/2019 0556   CHOLHDL 4.5 09/22/2019 0556   VLDL 16 09/22/2019 0556   LDLCALC 101 (H) 09/22/2019 0556      Wt Readings from Last 3 Encounters:  10/03/19 (!) 306 lb 7 oz (139 kg)    Other studies Reviewed: Additional studies/ records that were reviewed today include:   Review of the above records demonstrates:  Echo 09/20/19 1. Severe global reduction in LV systolic function; moderate LVE;  grade 2  diastolic dysfunction; biatrial enlargement; severe RV dysfunction; mild  TR with severe pulmonary hypertension.  2. Left ventricular ejection fraction, by estimation, is 20 to 25%. The  left ventricle has severely decreased function. The left ventricle  demonstrates global hypokinesis. The left ventricular internal cavity size  was moderately dilated. Left  ventricular diastolic parameters are consistent with Grade II diastolic  dysfunction (pseudonormalization). Elevated left atrial pressure.  3. Right ventricular systolic function is severely reduced. The right  ventricular size is normal. There is severely elevated pulmonary artery  systolic pressure.  4. Left atrial size was mildly dilated.  5. Right atrial size was moderately dilated.  6. The mitral valve is normal in structure. Trivial mitral valve  regurgitation. No evidence of mitral stenosis.  7. The aortic valve is tricuspid. Aortic valve regurgitation is not  visualized. No aortic stenosis is present.  8. The inferior vena cava is dilated in size with <50% respiratory  variability, suggesting right atrial pressure of 15 mmHg.   Cardiac cath 01/02/57  LV end diastolic pressure is moderately elevated.  Hemodynamic findings consistent with severe pulmonary hypertension.  1. Normal coronary anatomy 2. Moderately elevated LV filling pressures. 3. Severely elevated right heart filling pressures 4. Severe pulmonary HTN 5. Preserved cardiac output 6. Chronic severe respiratory failure. PH 7.36, PCO2 71, Bicarb 40. P02 152. Suspect chronic hypoventilation.  ASSESSMENT AND PLAN:  1. Acutecombinedsystolicand distolicCHF: -New during recent hospital admission with EF at 20-25% with G2DD; echo showed generalized hypokinesis -[facility] 09/28/19 showed normal cors and moderately elevated filling pressures, severe pulmonary HTN -Diuresed during hospital course with transition to p.o. Lasix 40 mg daily at  discharge>>tolerating well  -Weight down 375lbs on admisison>306lbs at discharge>>301lbs today  -Creatinine,1.45 at discharge >>1.42 at SNF -Recheck BMET today  -Unable to add ACEI/ARB/ARNI due to renal function and low BP -Continue current regimen -Recheck echo in 2 months   2. Nonischemic CM: -Felt to be secondary to hypertension -Continue BiDil 2 tabs 3 times daily -If creatinine stabilizes consider Entresto -Continue carvedilol 25 mg p.o. twice daily -Discontinue amlodipine   3. CKD stage III: -Creatinine at hospital discharge, 1.5>>1.42 at SNF  4. Severe pulmonary HTN: -Likely secondary to obesity hypoventilation syndrome, sleep apnea -Needs sleep study after discharge>>>will wait until after home from SNF  5. Morbid obesity -Recommend weight loss  6. HTN: -Low now>>>102/62 -Continue Bidil 2tabsTID, Coreg 25mg  BID -Discontinue amlodipine    Current medicines are reviewed at length with the patient today.  The patient does not have concerns regarding medicines.  The following changes have been made:  Discontinue amlodipine   Labs/ tests ordered today include: BMET  No orders of the defined types were placed in this encounter.   Disposition:   FU with Dr. Angelena Form  in 6 weeks  Signed, Kathyrn Drown, NP  10/18/2019 11:28 AM    [address], [place], [place]  [address] Phone: [phone]; Fax: [phone]

## 2019-10-17 ENCOUNTER — Other Ambulatory Visit: Payer: BLUE CROSS/BLUE SHIELD

## 2019-10-17 ENCOUNTER — Ambulatory Visit: Payer: BLUE CROSS/BLUE SHIELD | Admitting: Physician Assistant

## 2019-10-18 ENCOUNTER — Other Ambulatory Visit: Payer: Self-pay

## 2019-10-18 ENCOUNTER — Ambulatory Visit: Payer: BLUE CROSS/BLUE SHIELD | Admitting: Cardiology

## 2019-10-18 ENCOUNTER — Encounter: Payer: Self-pay | Admitting: Cardiology

## 2019-10-18 ENCOUNTER — Ambulatory Visit (INDEPENDENT_AMBULATORY_CARE_PROVIDER_SITE_OTHER): Payer: BLUE CROSS/BLUE SHIELD | Admitting: Cardiology

## 2019-10-18 ENCOUNTER — Telehealth: Payer: Self-pay | Admitting: Cardiology

## 2019-10-18 VITALS — BP 102/62 | HR 78 | Ht 73.0 in | Wt 301.0 lb

## 2019-10-18 DIAGNOSIS — I5041 Acute combined systolic (congestive) and diastolic (congestive) heart failure: Secondary | ICD-10-CM

## 2019-10-18 DIAGNOSIS — I10 Essential (primary) hypertension: Secondary | ICD-10-CM | POA: Diagnosis not present

## 2019-10-18 DIAGNOSIS — I130 Hypertensive heart and chronic kidney disease with heart failure and stage 1 through stage 4 chronic kidney disease, or unspecified chronic kidney disease: Secondary | ICD-10-CM

## 2019-10-18 DIAGNOSIS — I420 Dilated cardiomyopathy: Secondary | ICD-10-CM | POA: Diagnosis not present

## 2019-10-18 NOTE — Patient Instructions (Signed)
Medication Instructions:   Your physician recommends that you continue on your current medications as directed. Please refer to the Current Medication list given to you today.  *If you need a refill on your cardiac medications before your next appointment, please call your pharmacy*  Lab Work:  You will have labs drawn today: BMET  Testing/Procedures:  None ordered today  Follow-Up: At [facility], you and your health needs are our priority.  As part of our continuing mission to provide you with exceptional heart care, we have created designated Provider Care Teams.  These Care Teams include your primary Cardiologist (physician) and Advanced Practice Providers (APPs -  Physician Assistants and Nurse Practitioners) who all work together to provide you with the care you need, when you need it.  We recommend signing up for the patient portal called "MyChart".  Sign up information is provided on this After Visit Summary.  MyChart is used to connect with patients for Virtual Visits (Telemedicine).  Patients are able to view lab/test results, encounter notes, upcoming appointments, etc.  Non-urgent messages can be sent to your provider as well.   To learn more about what you can do with MyChart, go to [email or web address].    Your next appointment:    On 12/03/19 at 11:20AM with Verne Carrow, MD

## 2019-10-19 LAB — BASIC METABOLIC PANEL
BUN/Creatinine Ratio: 14 (ref 10–24)
BUN: 19 mg/dL (ref 8–27)
CO2: 27 mmol/L (ref 20–29)
Calcium: 8.8 mg/dL (ref 8.6–10.2)
Chloride: 100 mmol/L (ref 96–106)
Creatinine, Ser: 1.33 mg/dL — ABNORMAL HIGH (ref 0.76–1.27)
GFR calc Af Amer: 66 mL/min/{1.73_m2} (ref >59)
GFR calc non Af Amer: 57 mL/min/{1.73_m2} — ABNORMAL LOW (ref >59)
Glucose: 97 mg/dL (ref 65–99)
Potassium: 4.6 mmol/L (ref 3.5–5.2)
Sodium: 141 mmol/L (ref 134–144)

## 2019-11-01 ENCOUNTER — Telehealth: Payer: Self-pay | Admitting: Cardiovascular Disease

## 2019-11-01 NOTE — Telephone Encounter (Signed)
Clydie Braun from [facility] calling requesting Dr. Clifton James to follow the patient's care and sign their orders for physical therapy.

## 2019-11-01 NOTE — Telephone Encounter (Signed)
I spoke to Clydie Braun from [facility] and informed her that Dr Clifton James will follow patient and address future PT orders.

## 2019-11-02 ENCOUNTER — Other Ambulatory Visit: Payer: Self-pay

## 2019-11-02 ENCOUNTER — Telehealth: Payer: Self-pay

## 2019-11-02 MED ORDER — ISOSORB DINITRATE-HYDRALAZINE 20-37.5 MG PO TABS: 2.0000 | ORAL_TABLET | Freq: Three times a day (TID) | ORAL | 3 refills | Status: DC

## 2019-11-02 MED ORDER — ISOSORBIDE DINITRATE 40 MG PO TABS: 40.0000 mg | ORAL_TABLET | Freq: Three times a day (TID) | ORAL | 3 refills | Status: AC

## 2019-11-02 MED ORDER — HYDRALAZINE HCL 50 MG PO TABS
75.0000 mg | ORAL_TABLET | Freq: Three times a day (TID) | ORAL | 3 refills | Status: DC
Start: 2019-11-02 — End: 2021-02-11

## 2019-11-02 NOTE — Telephone Encounter (Signed)
Pt's sister is calling stating that pt needs a prior auth on his medication isosorbide-hydralazine (Bidil). She called the first time and requested a refill and I sent this into pt's pharmacy. The sister called back and stated that the pharmacy received the Rx, but medication needed a prior auth. Larita Fife, LPN, could you please look into this matter. Thanks

## 2019-11-02 NOTE — Telephone Encounter (Signed)
This medication can be slit into its two components, hydralazine and isosorbide dinitrate and the two separate components are in-expensive.  Spoke with sister Efraim Kaufmann, who is in agreement with plan to change from Bidil to hydralazine 75mg  TID and isosorbide dinitrate 40mg  three times a day  Rx sent to pharmacy

## 2019-11-02 NOTE — Telephone Encounter (Addendum)
**Note De-Identified Jack Chavez Obfuscation** I have started a Isosorbide-Hydralazine (Bidil) PA through covermymeds. Key: [id number]  I have advised the pts sister Jack Chavez of the following message received through covermymeds: Your information has been submitted to [facility]. [employer] [place] will review the request and notify you/your MD of the determination decision, typically within 72 hours of receiving all information.  She is upset that it could take up to 72 hours for a decision from [facility] and states that they paid a lot of money yesterday for a 2 day supply of Bidil and cannot afford to pay for more and that the pt needs this medication ASAP as today is Friday and will run out tonight. She states that she does not want him going a day without his CHF medications.  She wants to know if the pt can be prescribed a less expensive medication to replace Bidil.  I have advised her that I will reach out to Dr Clifton James, Our pharmacy team and Georgie Chard, NP for advisement.

## 2019-11-02 NOTE — Telephone Encounter (Signed)
Thank you Melissa! I will call the pts sister back and make her aware.

## 2019-11-02 NOTE — Telephone Encounter (Signed)
Pt's medication was sent to pt's pharmacy as requested. Confirmation received.  °

## 2019-11-05 ENCOUNTER — Encounter (HOSPITAL_COMMUNITY): Payer: Self-pay | Admitting: *Deleted

## 2019-11-05 ENCOUNTER — Other Ambulatory Visit: Payer: Self-pay

## 2019-11-05 ENCOUNTER — Emergency Department (HOSPITAL_COMMUNITY): Payer: BLUE CROSS/BLUE SHIELD

## 2019-11-05 ENCOUNTER — Observation Stay (HOSPITAL_COMMUNITY)
Admission: EM | Admit: 2019-11-05 | Discharge: 2019-11-06 | Disposition: A | Discharge status: Home / Self Care | Payer: BLUE CROSS/BLUE SHIELD | Attending: Family Medicine | Admitting: Family Medicine

## 2019-11-05 DIAGNOSIS — N289 Disorder of kidney and ureter, unspecified: Secondary | ICD-10-CM

## 2019-11-05 DIAGNOSIS — I130 Hypertensive heart and chronic kidney disease with heart failure and stage 1 through stage 4 chronic kidney disease, or unspecified chronic kidney disease: Secondary | ICD-10-CM | POA: Diagnosis not present

## 2019-11-05 DIAGNOSIS — Z885 Allergy status to narcotic agent status: Secondary | ICD-10-CM | POA: Insufficient documentation

## 2019-11-05 DIAGNOSIS — Z833 Family history of diabetes mellitus: Secondary | ICD-10-CM | POA: Diagnosis not present

## 2019-11-05 DIAGNOSIS — Z791 Long term (current) use of non-steroidal anti-inflammatories (NSAID): Secondary | ICD-10-CM | POA: Insufficient documentation

## 2019-11-05 DIAGNOSIS — Z7901 Long term (current) use of anticoagulants: Secondary | ICD-10-CM | POA: Diagnosis not present

## 2019-11-05 DIAGNOSIS — Z20822 Contact with and (suspected) exposure to covid-19: Secondary | ICD-10-CM | POA: Insufficient documentation

## 2019-11-05 DIAGNOSIS — N183 Chronic kidney disease, stage 3 unspecified: Secondary | ICD-10-CM | POA: Diagnosis not present

## 2019-11-05 DIAGNOSIS — R42 Dizziness and giddiness: Principal | ICD-10-CM | POA: Insufficient documentation

## 2019-11-05 DIAGNOSIS — I10 Essential (primary) hypertension: Secondary | ICD-10-CM

## 2019-11-05 DIAGNOSIS — R55 Syncope and collapse: Secondary | ICD-10-CM

## 2019-11-05 DIAGNOSIS — E1122 Type 2 diabetes mellitus with diabetic chronic kidney disease: Secondary | ICD-10-CM | POA: Insufficient documentation

## 2019-11-05 DIAGNOSIS — Z87891 Personal history of nicotine dependence: Secondary | ICD-10-CM | POA: Insufficient documentation

## 2019-11-05 DIAGNOSIS — E662 Morbid (severe) obesity with alveolar hypoventilation: Secondary | ICD-10-CM | POA: Insufficient documentation

## 2019-11-05 DIAGNOSIS — I272 Pulmonary hypertension, unspecified: Secondary | ICD-10-CM | POA: Insufficient documentation

## 2019-11-05 DIAGNOSIS — I420 Dilated cardiomyopathy: Secondary | ICD-10-CM | POA: Insufficient documentation

## 2019-11-05 DIAGNOSIS — I5042 Chronic combined systolic (congestive) and diastolic (congestive) heart failure: Secondary | ICD-10-CM | POA: Insufficient documentation

## 2019-11-05 DIAGNOSIS — E876 Hypokalemia: Secondary | ICD-10-CM | POA: Insufficient documentation

## 2019-11-05 DIAGNOSIS — Z79899 Other long term (current) drug therapy: Secondary | ICD-10-CM | POA: Insufficient documentation

## 2019-11-05 DIAGNOSIS — I509 Heart failure, unspecified: Secondary | ICD-10-CM

## 2019-11-05 LAB — CBC WITH DIFFERENTIAL/PLATELET
Abs Immature Granulocytes: 0.02 10*3/uL (ref 0.00–0.07)
Basophils Absolute: 0.1 10*3/uL (ref 0.0–0.1)
Basophils Relative: 1 %
Eosinophils Absolute: 1.8 10*3/uL — ABNORMAL HIGH (ref 0.0–0.5)
Eosinophils Relative: 27 %
HCT: 47.2 % (ref 39.0–52.0)
Hemoglobin: 14.7 g/dL (ref 13.0–17.0)
Immature Granulocytes: 0 %
Lymphocytes Relative: 15 %
Lymphs Abs: 1 10*3/uL (ref 0.7–4.0)
MCH: 27.8 pg (ref 26.0–34.0)
MCHC: 31.1 g/dL (ref 30.0–36.0)
MCV: 89.2 fL (ref 80.0–100.0)
Monocytes Absolute: 0.5 10*3/uL (ref 0.1–1.0)
Monocytes Relative: 8 %
Neutro Abs: 3.1 10*3/uL (ref 1.7–7.7)
Neutrophils Relative %: 49 %
Platelets: 292 10*3/uL (ref 150–400)
RBC: 5.29 MIL/uL (ref 4.22–5.81)
RDW: 15.9 % — ABNORMAL HIGH (ref 11.5–15.5)
WBC: 6.5 10*3/uL (ref 4.0–10.5)
nRBC: 0 % (ref 0.0–0.2)

## 2019-11-05 LAB — BASIC METABOLIC PANEL
Anion gap: 10 (ref 5–15)
BUN: 31 mg/dL — ABNORMAL HIGH (ref 8–23)
CO2: 27 mmol/L (ref 22–32)
Calcium: 8.9 mg/dL (ref 8.9–10.3)
Chloride: 104 mmol/L (ref 98–111)
Creatinine, Ser: 1.76 mg/dL — ABNORMAL HIGH (ref 0.61–1.24)
GFR calc Af Amer: 47 mL/min — ABNORMAL LOW (ref >60)
GFR calc non Af Amer: 41 mL/min — ABNORMAL LOW (ref >60)
Glucose, Bld: 107 mg/dL — ABNORMAL HIGH (ref 70–99)
Potassium: 4.7 mmol/L (ref 3.5–5.1)
Sodium: 141 mmol/L (ref 135–145)

## 2019-11-05 LAB — BRAIN NATRIURETIC PEPTIDE: B Natriuretic Peptide: 99.7 pg/mL (ref 0.0–100.0)

## 2019-11-05 MED ORDER — ACETAMINOPHEN 325 MG PO TABS: 650.0000 mg | ORAL_TABLET | Freq: Four times a day (QID) | ORAL | Status: DC | PRN

## 2019-11-05 MED ORDER — DICLOFENAC SODIUM 1 % EX GEL: 2.0000 g | Freq: Four times a day (QID) | CUTANEOUS | Status: DC

## 2019-11-05 MED ORDER — ONDANSETRON HCL 4 MG PO TABS: 4.0000 mg | ORAL_TABLET | Freq: Four times a day (QID) | ORAL | Status: DC | PRN

## 2019-11-05 MED ORDER — SODIUM CHLORIDE 0.9% FLUSH
3.0000 mL | Freq: Two times a day (BID) | INTRAVENOUS | Status: DC
  Administered 2019-11-06 (×2): 3 mL via INTRAVENOUS

## 2019-11-05 MED ORDER — CARVEDILOL 12.5 MG PO TABS
25.0000 mg | ORAL_TABLET | Freq: Two times a day (BID) | ORAL | Status: DC
  Administered 2019-11-06: 13:00:00 25 mg via ORAL
  Filled 2019-11-05: qty 2

## 2019-11-05 MED ORDER — ACETAMINOPHEN 650 MG RE SUPP: 650.0000 mg | Freq: Four times a day (QID) | RECTAL | Status: DC | PRN

## 2019-11-05 MED ORDER — VITAMIN B12 3000 MCG/ML SL LIQD: 1.0000 mL | Freq: Every day | SUBLINGUAL | Status: DC

## 2019-11-05 MED ORDER — ISOSORBIDE DINITRATE 20 MG PO TABS
40.0000 mg | ORAL_TABLET | Freq: Three times a day (TID) | ORAL | Status: DC
  Administered 2019-11-06 (×2): 40 mg via ORAL
  Filled 2019-11-05 (×3): qty 2

## 2019-11-05 MED ORDER — ONDANSETRON HCL 4 MG/2ML IJ SOLN: 4.0000 mg | Freq: Four times a day (QID) | INTRAMUSCULAR | Status: DC | PRN

## 2019-11-05 MED ORDER — COLCHICINE 0.6 MG PO TABS
0.6000 mg | ORAL_TABLET | Freq: Two times a day (BID) | ORAL | Status: DC
  Administered 2019-11-06 (×2): 0.6 mg via ORAL
  Filled 2019-11-05 (×2): qty 1

## 2019-11-05 MED ORDER — VITAMIN B-12 1000 MCG PO TABS
1000.0000 ug | ORAL_TABLET | Freq: Every day | ORAL | Status: DC
  Administered 2019-11-06: 1000 ug via ORAL
  Filled 2019-11-05: qty 1

## 2019-11-05 MED ORDER — MAGNESIUM 500 MG PO TABS: 3.0000 | ORAL_TABLET | Freq: Every day | ORAL | Status: DC

## 2019-11-05 MED ORDER — AMLODIPINE BESYLATE 5 MG PO TABS: 5.0000 mg | ORAL_TABLET | Freq: Every day | ORAL | Status: DC

## 2019-11-05 MED ORDER — HYDRALAZINE HCL 25 MG PO TABS
75.0000 mg | ORAL_TABLET | Freq: Three times a day (TID) | ORAL | Status: DC
  Administered 2019-11-06 (×2): 75 mg via ORAL
  Filled 2019-11-05 (×2): qty 3

## 2019-11-05 MED ORDER — ALLOPURINOL 100 MG PO TABS
100.0000 mg | ORAL_TABLET | Freq: Every day | ORAL | Status: DC
  Administered 2019-11-06: 13:00:00 100 mg via ORAL
  Filled 2019-11-05: qty 1

## 2019-11-05 MED ORDER — MAGNESIUM OXIDE 400 (241.3 MG) MG PO TABS
1200.0000 mg | ORAL_TABLET | Freq: Every day | ORAL | Status: DC
  Administered 2019-11-06: 1200 mg via ORAL
  Filled 2019-11-05: qty 3

## 2019-11-05 MED ORDER — ENOXAPARIN SODIUM 80 MG/0.8ML ~~LOC~~ SOLN
70.0000 mg | Freq: Every day | SUBCUTANEOUS | Status: DC
  Administered 2019-11-06: 70 mg via SUBCUTANEOUS
  Filled 2019-11-05: qty 0.7

## 2019-11-05 MED ORDER — FUROSEMIDE 40 MG PO TABS
40.0000 mg | ORAL_TABLET | Freq: Every day | ORAL | Status: DC
  Administered 2019-11-06: 13:00:00 40 mg via ORAL
  Filled 2019-11-05: qty 1

## 2019-11-05 NOTE — ED Notes (Signed)
Messaged pharmacy to verify meds unable to give at scheduled time.

## 2019-11-05 NOTE — ED Provider Notes (Signed)
[facility]-EMERGENCY DEPT Provider Note   CSN: [id number] Arrival date & time: 11/05/19  1514     History Chief Complaint  Patient presents with  . Dizziness    Jack Chavez is a 64 y.o. male with a past medical history of CHF with an EF of 25% seen on echo 2 months ago, morbid obesity, hypertension presenting to the ED with a chief complaint of lightheadedness.  This morning woke up in his usual state of health.  Approximately 3 hours ago started experiencing lightheadedness while he was trying to use the bathroom.  He denied any chest pain, headache, vision changes, numbness in arms or legs at the time or currently.  States that his symptoms have gradually improved without intervention since arrival to the ED.  He was concerned because EMS checked his blood pressure was found to have systolic blood pressure of 70.  States that during his recent admission 2 months ago "my blood pressure went up and down all the time."  He did take his morning antihypertensive medication and denies any missed doses.  States that the swelling in his leg has significantly improved since his admission.  He denies any numbness in arms or legs, vomiting, abdominal pain, changes to activity or appetite, history of CVA, chest pain, injuries or falls, headache.  HPI     Past Medical History:  Diagnosis Date  . HTN (hypertension)   . Morbid obesity [facility]     Patient Active Problem List   Diagnosis Date Noted  . DCM (dilated cardiomyopathy) (HCC)   . ARF (acute renal failure) (HCC)   . Effusion, right knee 09/23/2019  . Acute systolic CHF (congestive heart failure), NYHA class 4 (HCC)   . Acute exacerbation of CHF (congestive heart failure) (HCC) 09/20/2019  . Renal insufficiency 09/20/2019  . Hypokalemia 09/20/2019  . Serum total bilirubin elevated 09/20/2019  . Hypertension 09/20/2019    Past Surgical History:  Procedure Laterality Date  . KNEE SURGERY    . RIGHT/LEFT HEART  CATH AND CORONARY ANGIOGRAPHY N/A 09/28/2019   Procedure: RIGHT/LEFT HEART CATH AND CORONARY ANGIOGRAPHY;  Surgeon: Swaziland, Peter M, MD;  Location: [facility] INVASIVE CV LAB;  Service: Cardiovascular;  Laterality: N/A;  . SHOULDER SURGERY         Family History  Problem Relation Age of Onset  . Diabetes Father   . Diabetes Paternal Grandmother     Social History   Tobacco Use  . Smoking status: Former Smoker    Types: Cigars  . Smokeless tobacco: Never Used  . Tobacco comment: Occasional cigars in the 1980s  Substance Use Topics  . Alcohol use: Yes    Comment: occasional - just few drinks a month  . Drug use: Never    Home Medications Prior to Admission medications   Medication Sig Start Date End Date Taking? Authorizing Provider  allopurinol (ZYLOPRIM) 100 MG tablet Take 1 tablet (100 mg total) by mouth daily. 10/04/19  Yes Rai, Ripudeep K, MD  amLODipine (NORVASC) 5 MG tablet Take 5 mg by mouth daily.   Yes [provider]  carvedilol (COREG) 25 MG tablet Take 1 tablet (25 mg total) by mouth 2 (two) times daily with a meal. 10/03/19  Yes Rai, Ripudeep K, MD  colchicine 0.6 MG tablet Take 0.6 mg by mouth 2 (two) times daily. 11/01/19  Yes [provider]  Cyanocobalamin (VITAMIN B12) 3000 MCG/ML LIQD Place 1 mL under the tongue daily.   Yes [provider]  diclofenac Sodium (VOLTAREN) 1 % GEL Apply 2 g topically 4 (four) times daily. 10/03/19  Yes Rai, Ripudeep K, MD  furosemide (LASIX) 40 MG tablet Take 1 tablet (40 mg total) by mouth daily. 10/04/19  Yes Rai, Ripudeep K, MD  Garlic 10 MG CAPS Take 2 capsules by mouth in the morning and at bedtime.   Yes [provider]  hydrALAZINE (APRESOLINE) 50 MG tablet Take 1.5 tablets (75 mg total) by mouth 3 (three) times daily. 11/02/19  Yes Kathleene Hazel, MD  isosorbide dinitrate (ISORDIL) 40 MG tablet Take 1 tablet (40 mg total) by mouth 3 (three) times daily. 11/02/19  Yes Kathleene Hazel, MD    Magnesium 500 MG TABS Take 3 tablets by mouth daily.   Yes [provider]  Probiotic Product (PROBIOTIC PO) Take 1 capsule by mouth daily.   Yes [provider]  TURMERIC PO Take 1 capsule by mouth every other day.   Yes [provider]  VITAMIN A PO Take 1 tablet by mouth every other day.   Yes [provider]  lidocaine (LIDODERM) 5 % Place 1 patch onto the skin daily. Remove & Discard patch within 12 hours or as directed by MD Patient not taking: Reported on 11/05/2019 10/04/19   Cathren Harsh, MD    Allergies    Diclofenac and Morphine and related  Review of Systems   Review of Systems  Constitutional: Negative for appetite change, chills and fever.  HENT: Negative for ear pain, rhinorrhea, sneezing and sore throat.   Eyes: Negative for photophobia and visual disturbance.  Respiratory: Negative for cough, chest tightness, shortness of breath and wheezing.   Cardiovascular: Negative for chest pain and palpitations.  Gastrointestinal: Negative for abdominal pain, blood in stool, constipation, diarrhea, nausea and vomiting.  Genitourinary: Negative for dysuria, hematuria and urgency.  Musculoskeletal: Negative for myalgias.  Skin: Negative for rash.  Neurological: Positive for dizziness and light-headedness. Negative for weakness.    Physical Exam Updated Vital Signs BP (!) 141/82   Pulse 82   Temp 98 F (36.7 C) (Oral)   Resp 15   SpO2 94%   Physical Exam Vitals and nursing note reviewed.  Constitutional:      General: He is not in acute distress.    Appearance: He is well-developed. He is obese.  HENT:     Head: Normocephalic and atraumatic.     Nose: Nose normal.  Eyes:     General: No scleral icterus.       Right eye: No discharge.        Left eye: No discharge.     Conjunctiva/sclera: Conjunctivae normal.     Pupils: Pupils are equal, round, and reactive to light.  Cardiovascular:     Rate and Rhythm: Normal rate and regular  rhythm.     Heart sounds: Normal heart sounds. No murmur. No friction rub. No gallop.   Pulmonary:     Effort: Pulmonary effort is normal. No respiratory distress.     Breath sounds: Normal breath sounds.  Abdominal:     General: Bowel sounds are normal. There is no distension.     Palpations: Abdomen is soft.     Tenderness: There is no abdominal tenderness. There is no guarding.  Musculoskeletal:        General: Normal range of motion.     Cervical back: Normal range of motion and neck supple.     Comments: No lower extremity edema, erythema or calf  tenderness noted bilaterally.  Skin:    General: Skin is warm and dry.     Findings: No rash.  Neurological:     General: No focal deficit present.     Mental Status: He is alert and oriented to person, place, and time.     Cranial Nerves: No cranial nerve deficit.     Sensory: No sensory deficit.     Motor: No weakness or abnormal muscle tone.     Coordination: Coordination normal.     Comments: Pupils reactive. No facial asymmetry noted. Cranial nerves appear grossly intact. Sensation intact to light touch on face, BUE and BLE. Strength 5/5 in BUE and BLE. Normal finger to nose and heel to shin noted bilaterally.     ED Results / Procedures / Treatments   Labs (all labs ordered are listed, but only abnormal results are displayed) Labs Reviewed  BASIC METABOLIC PANEL - Abnormal; Notable for the following components:      Result Value   Glucose, Bld 107 (*)    BUN 31 (*)    Creatinine, Ser 1.76 (*)    GFR calc non Af Amer 41 (*)    GFR calc Af Amer 47 (*)    All other components within normal limits  CBC WITH DIFFERENTIAL/PLATELET - Abnormal; Notable for the following components:   RDW 15.9 (*)    Eosinophils Absolute 1.8 (*)    All other components within normal limits  BRAIN NATRIURETIC PEPTIDE    EKG EKG Interpretation  Date/Time:  Monday Nov 05 2019 16:44:25 EDT Ventricular Rate:  82 PR Interval:    QRS  Duration: 110 QT Interval:  402 QTC Calculation: 470 R Axis:   -60 Text Interpretation: Sinus rhythm Borderline prolonged PR interval Biatrial enlargement Left ventricular hypertrophy No significant change since last tracing Confirmed by Deno Etienne [phone]) on 11/05/2019 4:50:04 PM   Radiology DG Chest 2 View  Result Date: 11/05/2019 CLINICAL DATA:  Dizziness and hypotension EXAM: CHEST - 2 VIEW COMPARISON:  09/19/2019 FINDINGS: Cardiac shadow is stable. Previously seen right basilar effusion and infiltrate as resolved in the interval. No focal abnormality is seen. Degenerative change of the thoracic spine is noted. IMPRESSION: Resolution of previously seen right-sided infiltrate and effusion. Electronically Signed   By: Inez Catalina M.D.   On: 11/05/2019 16:49    Procedures Procedures (including critical care time)  Medications Ordered in ED Medications - No data to display  ED Course  I have reviewed the triage vital signs and the nursing notes.  Pertinent labs & imaging results that were available during my care of the patient were reviewed by me and considered in my medical decision making (see chart for details).  Clinical Course as of Nov 04 2049  Mon Nov 05, 2019  1719 Up from 1.3 two weeks ago.  Creatinine(!): 1.76 [HK]  2048 Spoke to cardiologist who states patient can be admitted for tele monitoring but no other recommendations from a cardiology perspective, as he is not a candidate for ICD placement. Hospitalist updated on care.   [HK]    Clinical Course User Index [HK] Delia Heady, PA-C   MDM Rules/Calculators/A&P                      [phone] M with a past medical history of CHF with an EF of 25% seen on echo 2 months ago, obesity, hypertension presenting to the ED with a chief complaint of lightheadedness while trying to use the  bathroom prior to arrival.  Denied any prior or current chest pain, headache, vision changes, numbness in arms or legs.  His symptoms gradually  improved without intervention.  He was concerned because EMS checked his blood pressure was found to have systolic blood pressure of 70.  On exam patient has no neurological deficits.  He denies chest pain.  Abdomen is soft, nontender nondistended.  Blood pressure here within normal limits.  Lab work significant for creatinine of 1.7 which is slightly elevated from 2 weeks ago when it was 1.3.  Chest x-ray with improvement in his effusions.  EKG without any changes from prior tracings.  CBC and BNP are unremarkable. Due to patient's marked decrease in EF on recent echo feel that he will need to be admitted for observation for this near syncopal episode.  All imaging, if done today, including plain films, CT scans, and ultrasounds, independently reviewed by me, and interpretations confirmed via formal radiology reads.  Portions of this note were generated with Scientist, clinical (histocompatibility and immunogenetics). Dictation errors may occur despite best attempts at proofreading.  Final Clinical Impression(s) / ED Diagnoses Final diagnoses:  Near syncope  Chronic heart failure, unspecified heart failure type [facility])    Rx / DC Orders ED Discharge Orders    None       Dietrich Pates, PA-C 11/05/19 2051    Melene Plan, DO 11/05/19 2125

## 2019-11-05 NOTE — H&P (Signed)
History and Physical    Jack Chavez WJX:[id number] DOB: Jun 13, 1956 DOA: 11/05/2019  PCP: Patient, No Pcp Per  Patient coming from: Home  I have personally briefly reviewed patient's old medical records in [facility] Health Link  Chief Complaint: Dizziness  HPI: Jack Chavez is a 64 y.o. male with medical history significant of HTN, HFrEF 25% due to NICM / DCM seen on 2d echo end of March.  L and R heart cath showed normal coronaries, severe PAH, "preserved cardiac output".  Pt presents to ED with lightheadedness, onset 3h ago while trying to use bathroom, lasted about 1h, completely resolved at this time.  SBP 70s according to EMS but in ED BP running normal to high.   ED Course: ED workup neg.  Cardiology notes: 1) not candidate yet for AICD placement, needs to have reduced EF for 3 months first 2) life vests controversial currently, doubts that life vest would be recommended. 3) doubts v.fib or v.tach episode 4) doesn't even feel pt really needs to be hospitalized right now  EDP disagrees and insists on overnight obs.   Review of Systems: As per HPI, otherwise all review of systems negative.  Past Medical History:  Diagnosis Date  . HTN (hypertension)   . Morbid obesity (HCC)     Past Surgical History:  Procedure Laterality Date  . KNEE SURGERY    . RIGHT/LEFT HEART CATH AND CORONARY ANGIOGRAPHY N/A 09/28/2019   Procedure: RIGHT/LEFT HEART CATH AND CORONARY ANGIOGRAPHY;  Surgeon: Swaziland, Peter M, MD;  Location: [facility] INVASIVE CV LAB;  Service: Cardiovascular;  Laterality: N/A;  . SHOULDER SURGERY       reports that he has quit smoking. His smoking use included cigars. He has never used smokeless tobacco. He reports current alcohol use. He reports that he does not use drugs.  Allergies  Allergen Reactions  . Diclofenac Swelling  . Morphine And Related     Patient reports "stomach tightness" in the past related to morphine use and does not remember what other reactions it  caused.    Family History  Problem Relation Age of Onset  . Diabetes Father   . Diabetes Paternal Grandmother      Prior to Admission medications   Medication Sig Start Date End Date Taking? Authorizing Provider  allopurinol (ZYLOPRIM) 100 MG tablet Take 1 tablet (100 mg total) by mouth daily. 10/04/19  Yes Rai, Ripudeep K, MD  amLODipine (NORVASC) 5 MG tablet Take 5 mg by mouth daily.   Yes [provider]  carvedilol (COREG) 25 MG tablet Take 1 tablet (25 mg total) by mouth 2 (two) times daily with a meal. 10/03/19  Yes Rai, Ripudeep K, MD  colchicine 0.6 MG tablet Take 0.6 mg by mouth 2 (two) times daily. 11/01/19  Yes [provider]  Cyanocobalamin (VITAMIN B12) 3000 MCG/ML LIQD Place 1 mL under the tongue daily.   Yes [provider]  diclofenac Sodium (VOLTAREN) 1 % GEL Apply 2 g topically 4 (four) times daily. 10/03/19  Yes Rai, Ripudeep K, MD  furosemide (LASIX) 40 MG tablet Take 1 tablet (40 mg total) by mouth daily. 10/04/19  Yes Rai, Ripudeep K, MD  Garlic 10 MG CAPS Take 2 capsules by mouth in the morning and at bedtime.   Yes [provider]  hydrALAZINE (APRESOLINE) 50 MG tablet Take 1.5 tablets (75 mg total) by mouth 3 (three) times daily. 11/02/19  Yes Kathleene Hazel, MD  isosorbide dinitrate (ISORDIL) 40 MG tablet Take  1 tablet (40 mg total) by mouth 3 (three) times daily. 11/02/19  Yes Kathleene Hazel, MD  Magnesium 500 MG TABS Take 3 tablets by mouth daily.   Yes [provider]  Probiotic Product (PROBIOTIC PO) Take 1 capsule by mouth daily.   Yes [provider]  TURMERIC PO Take 1 capsule by mouth every other day.   Yes [provider]  VITAMIN A PO Take 1 tablet by mouth every other day.   Yes [provider]    Physical Exam: Vitals:   11/05/19 1900 11/05/19 1930 11/05/19 2030 11/05/19 2100  BP: 136/81 (!) 150/85 (!) 148/75 (!) 150/78  Pulse: 79 81 80 79  Resp: 12 13 20 15   Temp:        TempSrc:      SpO2: 97% 94% 95% 95%    Constitutional: NAD, calm, comfortable Eyes: PERRL, lids and conjunctivae normal ENMT: Mucous membranes are moist. Posterior pharynx clear of any exudate or lesions.Normal dentition.  Neck: normal, supple, no masses, no thyromegaly Respiratory: clear to auscultation bilaterally, no wheezing, no crackles. Normal respiratory effort. No accessory muscle use.  Cardiovascular: Regular rate and rhythm, no murmurs / rubs / gallops. No extremity edema. 2+ pedal pulses. No carotid bruits.  Abdomen: no tenderness, no masses palpated. No hepatosplenomegaly. Bowel sounds positive.  Musculoskeletal: no clubbing / cyanosis. No joint deformity upper and lower extremities. Good ROM, no contractures. Normal muscle tone.  Skin: no rashes, lesions, ulcers. No induration Neurologic: CN 2-12 grossly intact. Sensation intact, DTR normal. Strength 5/5 in all 4.  Psychiatric: Normal judgment and insight. Alert and oriented x 3. Normal mood.    Labs on Admission: I have personally reviewed following labs and imaging studies  CBC: Recent Labs  Lab 11/05/19 1559  WBC 6.5  NEUTROABS 3.1  HGB 14.7  HCT 47.2  MCV 89.2  PLT 292   Basic Metabolic Panel: Recent Labs  Lab 11/05/19 1559  NA 141  K 4.7  CL 104  CO2 27  GLUCOSE 107*  BUN 31*  CREATININE 1.76*  CALCIUM 8.9   GFR: CrCl cannot be calculated (Unknown ideal weight.). Liver Function Tests: No results for input(s): AST, ALT, ALKPHOS, BILITOT, PROT, ALBUMIN in the last 168 hours. No results for input(s): LIPASE, AMYLASE in the last 168 hours. No results for input(s): AMMONIA in the last 168 hours. Coagulation Profile: No results for input(s): INR, PROTIME in the last 168 hours. Cardiac Enzymes: No results for input(s): CKTOTAL, CKMB, CKMBINDEX, TROPONINI in the last 168 hours. BNP (last 3 results) No results for input(s): PROBNP in the last 8760 hours. HbA1C: No results for input(s): HGBA1C in the  last 72 hours. CBG: No results for input(s): GLUCAP in the last 168 hours. Lipid Profile: No results for input(s): CHOL, HDL, LDLCALC, TRIG, CHOLHDL, LDLDIRECT in the last 72 hours. Thyroid Function Tests: No results for input(s): TSH, T4TOTAL, FREET4, T3FREE, THYROIDAB in the last 72 hours. Anemia Panel: No results for input(s): VITAMINB12, FOLATE, FERRITIN, TIBC, IRON, RETICCTPCT in the last 72 hours. Urine analysis:    Component Value Date/Time   COLORURINE YELLOW 09/20/2019 0733   APPEARANCEUR CLEAR 09/20/2019 0733   LABSPEC 1.009 09/20/2019 0733   PHURINE 5.0 09/20/2019 0733   GLUCOSEU NEGATIVE 09/20/2019 0733   HGBUR SMALL (A) 09/20/2019 0733   BILIRUBINUR NEGATIVE 09/20/2019 0733   KETONESUR NEGATIVE 09/20/2019 0733   PROTEINUR 100 (A) 09/20/2019 0733   NITRITE NEGATIVE 09/20/2019 0733   LEUKOCYTESUR NEGATIVE 09/20/2019 09/22/2019  Radiological Exams on Admission: DG Chest 2 View  Result Date: 11/05/2019 CLINICAL DATA:  Dizziness and hypotension EXAM: CHEST - 2 VIEW COMPARISON:  09/19/2019 FINDINGS: Cardiac shadow is stable. Previously seen right basilar effusion and infiltrate as resolved in the interval. No focal abnormality is seen. Degenerative change of the thoracic spine is noted. IMPRESSION: Resolution of previously seen right-sided infiltrate and effusion. Electronically Signed   By: Inez Catalina M.D.   On: 11/05/2019 16:49    EKG: Independently reviewed.  Assessment/Plan Principal Problem:   Dizziness Active Problems:   Renal insufficiency   Hypertension   DCM (dilated cardiomyopathy) ([place])    1. Dizziness - 1. Resolved 2. EDP requests overnight obs despite cardiology recs 3. Will put on tele monitor overnight 4. In discussion with cards: 1. Repeat 2d echo not indicated 2. No further IP cards work up planned at this point 2. HTN - 1. Cont home BP meds 2. Except, STOP the amlodipine that was supposed to be stopped anyhow after last cards office visit (see  their note) but wasn't 1. Low BP from amlodipine could be causing worsening of renal function as well as dizziness 2. But BP is normal to high right now so cont other home BP meds for now. 3. Renal insufficiency - 1. Creat of 1.7 today slightly worse than baseline 1.5, possibly due to hypotension earlier due to over treated HTN (pt continues to be on amlodipine despite cards wanting him off of this). 4. DCM - 1. Following with cards 2. If low EF persists for 3 months, then pt becomes candidate for AICD  DVT prophylaxis: Lovenox Code Status: Full Family Communication: No family in room Disposition Plan: Home, likely in AM Consults called: Spoke with cards on phone Dr. Sonia Side Admission status: Place in [place]     Hermie Reagor, [place] Hospital[facility]  How to contact the [facility] Attending or Consulting provider [place] or covering provider during after hours [place], for this patient?  1. Check the care team in [facility] and look for a) attending/consulting TR[facility] provider listed and b) the [facility] team listed 2. Log into [email or web address]  Amion Physician Scheduling and messaging for groups and whole hospitals  On call and physician scheduling software for group practices, residents, hospitalists and other medical providers for call, clinic, rotation and shift schedules. OnCall [employer] is a hospital-wide system for scheduling doctors and paging doctors on call. EasyPlot is for scientific plotting and data analysis.  [email or web address]  and use [place]'s universal password to access. If you do not have the password, please contact the hospital operator.  3. Locate the [facility] provider you are looking for under [facility] and page to a number that you can be directly reached. 4. If you still have difficulty reaching the provider, please page the [facility] (Director on Call) for the Hospitalists listed on amion for assistance.  11/05/2019, 9:11 PM

## 2019-11-05 NOTE — ED Triage Notes (Signed)
Pt arrived via [facility] dizziness and hypotension. Pt denies falls or change in medication. Pt reports recent hospitalist admission for "fluid overload and low blood pressure" . Per EMS A/OX4 and ambulatory with cane.   20G left had 500 NS    Hx CHF, HTN, Kidney Disease, Liver, gout Arthritis

## 2019-11-06 ENCOUNTER — Other Ambulatory Visit: Payer: Self-pay | Admitting: Student

## 2019-11-06 DIAGNOSIS — R42 Dizziness and giddiness: Secondary | ICD-10-CM

## 2019-11-06 DIAGNOSIS — I5042 Chronic combined systolic (congestive) and diastolic (congestive) heart failure: Secondary | ICD-10-CM | POA: Diagnosis not present

## 2019-11-06 DIAGNOSIS — I272 Pulmonary hypertension, unspecified: Secondary | ICD-10-CM

## 2019-11-06 DIAGNOSIS — I959 Hypotension, unspecified: Secondary | ICD-10-CM | POA: Diagnosis not present

## 2019-11-06 DIAGNOSIS — I428 Other cardiomyopathies: Secondary | ICD-10-CM | POA: Diagnosis not present

## 2019-11-06 DIAGNOSIS — N183 Chronic kidney disease, stage 3 unspecified: Secondary | ICD-10-CM

## 2019-11-06 LAB — SARS CORONAVIRUS 2 BY RT PCR (HOSPITAL ORDER, PERFORMED IN ~~LOC~~ HOSPITAL LAB): SARS Coronavirus 2: NEGATIVE

## 2019-11-06 LAB — CBG MONITORING, ED: Glucose-Capillary: 109 mg/dL — ABNORMAL HIGH (ref 70–99)

## 2019-11-06 NOTE — ED Notes (Signed)
Pt alert, resting, call bell within reach.  Pt expressed no needs at this time. Will continue to monitor.

## 2019-11-06 NOTE — Progress Notes (Signed)
Ordered 30-Day Event Monitor for further evaluation of dizziness/near syncope.

## 2019-11-06 NOTE — ED Notes (Signed)
Pt sister just called, states she is on the way to transport her brother home

## 2019-11-06 NOTE — ED Notes (Signed)
Pt ambulated over 30 feet.  Pt ambulated with assist device.  Pt's O2 remained around 93%.  Pt's respirations fluctuated from 17 to 33 per minute  the length of the ambulation.  Pt stated that he felt ok.

## 2019-11-06 NOTE — ED Notes (Signed)
Discharge paperwork reviewed with pt. Pt requested that RN contact his sister, listed in chart, to come and pick him up.  Sister, Efraim Kaufmann, called and HIPPA compliant message left.

## 2019-11-06 NOTE — Discharge Summary (Signed)
Physician Discharge Summary  Jack Chavez:[id number] DOB: Nov 24, 1955 DOA: 11/05/2019  PCP: Patient, No Pcp Per  Admit date: 11/05/2019 Discharge date: 11/06/2019  Time spent: 35 minutes  Recommendations for Outpatient Follow-up:  1. Needs Chem-12 and CBC 1 week 2. Suggest diet compatible with gout-have advised patient to cut down fructose and also red meat 3. Needs outpatient event monitor which will be scheduled by cardiologist 4. He will need outpatient stop bang or [facility] and may be sleep testing  Discharge Diagnoses:  Principal Problem:   Dizziness Active Problems:   Renal insufficiency   Hypertension   DCM (dilated cardiomyopathy) (HCC)   Discharge Condition: Improved  Diet recommendation: Heart healthy  There were no vitals filed for this visit.  History of present illness:  63 HFrEF recent admission 09/21/2019 AKI-Echo = pulmonary HTN EF 20-25% low RV function severe pulmonary HTN-RL HC 4-21-DC BiDil 2 tab twice daily Lasix-follow-up [facility] cardiology OV =?  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  ICD candidate versus not  Patient gives me further history that he was at skilled rehab for 2 weeks and the medication amlodipine was continued at the rehab facility despite him questioning it-he also felt like he was becoming dehydrated Ultimately he came to the emergency room for these issues  In the emergency room he was started back on some of his medications and cardiology was consulted for med management-he was not a candidate for ICD and will need close follow-up with event monitor and repeat echo in about 2 to 3 months to determine if  secondary prevention measures of AICD should be placed  Cardiology signed off on his care and felt he was stable to go home with event monitor being set up as above  Prior to discharge orthostatics were performed and he was ambulated and felt stable to get home and do his ADLs --He does have therapy services coming out to his home on discharge  Discharge Exam: Vitals:   11/06/19 0800 11/06/19 1211  BP: 139/82 (!) 143/81  Pulse: 85 85  Resp: 17 15  Temp:    SpO2: 94% 96%    General: Awake alert pleasant thick neck Mallampati 4 slightly swollen eyes no JVD no bruit S1-S2 no murmur Chest clinically clear no added sound no rales no rhonchi Abdomen obese patient has hernia umbilical which is slightly tender but not obstructed or strangulated Neurologically moving all 4 limbs equally power 5/5 Right knee is slightly swollen darker and more warm than the left consistent with his history of gout and he states this is better Euthymic and pleasant  Discharge Instructions   Discharge Instructions    Diet - low sodium heart healthy   Complete by: As directed    Discharge instructions   Complete by: As directed    Make sure u look at your meds carefully and NO AMLODIPINE Cardiology will schedule an event monitor for u and they will call   Increase activity slowly   Complete by: As directed      Allergies as of 11/06/2019      Reactions   Diclofenac Swelling   Morphine And Related    Patient reports "stomach tightness" in the past related to morphine use and does  not remember what other reactions it caused.      Medication List    STOP taking these medications   amLODipine 5 MG tablet Commonly known as: NORVASC     TAKE these medications   allopurinol 100 MG tablet Commonly known as: ZYLOPRIM Take 1 tablet (100 mg total) by mouth daily.   carvedilol 25 MG tablet Commonly known as: COREG Take 1 tablet (25 mg total) by mouth 2 (two) times daily with a meal.   colchicine  0.6 MG tablet Take 0.6 mg by mouth 2 (two) times daily.   diclofenac Sodium 1 % Gel Commonly known as: VOLTAREN Apply 2 g topically 4 (four) times daily.   furosemide 40 MG tablet Commonly known as: LASIX Take 1 tablet (40 mg total) by mouth daily.   Garlic 10 MG Caps Take 2 capsules by mouth in the morning and at bedtime.   hydrALAZINE 50 MG tablet Commonly known as: APRESOLINE Take 1.5 tablets (75 mg total) by mouth 3 (three) times daily.   isosorbide dinitrate 40 MG tablet Commonly known as: ISORDIL Take 1 tablet (40 mg total) by mouth 3 (three) times daily.   Magnesium 500 MG Tabs Take 3 tablets by mouth daily.   PROBIOTIC PO Take 1 capsule by mouth daily.   TURMERIC PO Take 1 capsule by mouth every other day.   VITAMIN A PO Take 1 tablet by mouth every other day.   Vitamin B12 3000 MCG/ML Liqd Place 1 mL under the tongue daily.      Allergies  Allergen Reactions  . Diclofenac Swelling  . Morphine And Related     Patient reports "stomach tightness" in the past related to morphine use and does not remember what other reactions it caused.   Follow-up Information    Burnell Blanks, MD Follow up.   Specialty: Cardiology Why: Hospital follow-up scheduled for 6/72021 at 11:20am. Please arrive 15 minutes early for check-in. If this date/time does not work for you, please call our office to reschedule.  Contact information: [address] [place] [address] [phone]            The results of significant diagnostics from this hospitalization (including imaging, microbiology, ancillary and laboratory) are listed below for reference.    Significant Diagnostic Studies: DG Chest 2 View  Result Date: 11/05/2019 CLINICAL DATA:  Dizziness and hypotension EXAM: CHEST - 2 VIEW COMPARISON:  09/19/2019 FINDINGS: Cardiac shadow is stable. Previously seen right basilar effusion and infiltrate as resolved in the interval. No focal abnormality  is seen. Degenerative change of the thoracic spine is noted. IMPRESSION: Resolution of previously seen right-sided infiltrate and effusion. Electronically Signed   By: Inez Catalina M.D.   On: 11/05/2019 16:49    Microbiology: Recent Results (from the past 240 hour(s))  SARS Coronavirus 2 by RT PCR (hospital order, performed in [facility] hospital lab) Nasopharyngeal Nasopharyngeal Swab     Status: None   Collection Time: 11/06/19  2:53 AM   Specimen: Nasopharyngeal Swab  Result Value Ref Range Status   SARS Coronavirus 2 NEGATIVE NEGATIVE Final    Comment: (NOTE) SARS-CoV-2 target nucleic acids are NOT DETECTED. The SARS-CoV-2 RNA is generally detectable in upper and lower respiratory specimens during the acute phase of infection. The lowest concentration of SARS-CoV-2 viral copies this assay can detect is 250 copies / mL. A negative result does not preclude SARS-CoV-2 infection and should not be used as the sole basis for treatment or  other patient management decisions.  A negative result may occur with improper specimen collection / handling, submission of specimen other than nasopharyngeal swab, presence of viral mutation(s) within the areas targeted by this assay, and inadequate number of viral copies (<250 copies / mL). A negative result must be combined with clinical observations, patient history, and epidemiological information. Fact Sheet for Patients:   BoilerBrush.com.cy Fact Sheet for Healthcare Providers: https://pope.com/ This test is not yet approved or cleared  by the [place] FDA and has been authorized for detection and/or diagnosis of SARS-CoV-2 by FDA under an Emergency Use Authorization (EUA).  This EUA will remain in effect (meaning this test can be used) for the duration of the COVID-19 declaration under Section 564(b)(1) of the Act, 21 U.S.C. section 360bbb-3(b)(1), unless the authorization is terminated  or revoked sooner. Performed at [facility], [address] [place], [place] [address]      Labs: Basic Metabolic Panel: Recent Labs  Lab 11/05/19 1559  NA 141  K 4.7  CL 104  CO2 27  GLUCOSE 107*  BUN 31*  CREATININE 1.76*  CALCIUM 8.9   Liver Function Tests: No results for input(s): AST, ALT, ALKPHOS, BILITOT, PROT, ALBUMIN in the last 168 hours. No results for input(s): LIPASE, AMYLASE in the last 168 hours. No results for input(s): AMMONIA in the last 168 hours. CBC: Recent Labs  Lab 11/05/19 1559  WBC 6.5  NEUTROABS 3.1  HGB 14.7  HCT 47.2  MCV 89.2  PLT 292   Cardiac Enzymes: No results for input(s): CKTOTAL, CKMB, CKMBINDEX, TROPONINI in the last 168 hours. BNP: BNP (last 3 results) Recent Labs    09/19/19 2100 11/05/19 1559  BNP 1,446.6* 99.7    ProBNP (last 3 results) No results for input(s): PROBNP in the last 8760 hours.  CBG: Recent Labs  Lab 11/06/19 0538  GLUCAP 109*       Signed:  Rhetta Mura MD   [facility] 11/06/2019, 1:57 PM

## 2019-11-06 NOTE — ED Notes (Signed)
Pt provided breakfast tray.

## 2019-11-06 NOTE — ED Notes (Addendum)
Updated sister on patient's condition. BP this morning was 76/38 prior to calling ems. Patient had not yet taken his bidil.

## 2019-11-06 NOTE — Consult Note (Addendum)
Cardiology Consultation:   Jack Chavez ID: Jack Chavez MRN: [id number]; DOB: 1956/02/11  Admit date: 11/05/2019 Date of Consult: 11/06/2019  Primary Care Provider: Patient, No Pcp Per Primary Cardiologist: Verne Carrow, MD  Primary Electrophysiologist:  None    Jack Chavez Profile:   Jack Chavez is a 64 y.o. male with a history of chronic combined CHF/non-ischemic cardiomyopathy, severe pulmonary hypertension, hypertension, CKD stage III, arthritis, and morbid obesity who is being seen today for the evaluation of syncope at the request of Dr. Mahala Menghini.  History of Present Illness:   Jack Chavez is a 64 year old male with the above history who is followed by Dr. Clifton James. Jack Chavez admitted from 09/19/2019 to 10/03/2019 for new onset acute combined CHF after presenting with dyspnea, lower extremity edema, ascites, and epigastric pain. Echo showed LVEF of 20-25% with moderately dilated LV, global hypokinesis, and grade 2 diastolic dysfunction. Also noted to have severely reduced RV function and severe pulmonary hypertension. He was aggressively diuresed and underwent right/left heart catheterization on 09/28/2019 which showed normal coronaries as well as moderately elevated LV filling pressures, severely elevated right heart filling pressure, and severe pulmonary hypertension but preserved cardiac output. Also noted to have chronic severe respiratory failure suspected to be due to chronic hypoventilation. Discharged on Lasix, Coreg, and Bidil. No ACEi/ARB/ARNI due to renal function and soft BP. Jack Chavez was discharged to SNF. Jack Chavez seen for follow-up on 10/18/2019 by Georgie Chard, NP, at which time he was doing relatively well since hospital discharge. However, he had noticed a drop in BP in the afternoons. Amlodipine was discontinued.    Jack Chavez presented to the ED yesterday via EMS for further evaluation of dizziness and hypotension. He states he came home from SNF on Thursday and had been doing well  until yesterday afternoon when he developed sudden onset of dizziness. This occurred after Jack Chavez got up and had a bowel movement. He ended up calling 911 and reports BP was 77/33 when they arrived. No palpitations or syncope with the dizziness. No other associated symptoms. He notes a couple episodes of dizziness while working with PT in the SNF but none since he has come home. Jack Chavez notes that he has still being taking the Amlodipine even though it was discontinued at last follow-up visit (he states this is because a SNF form was note completed indicating that Amlodipine should be stopped). He also states he thinks he may have been a little dehydrated. Other than this one event, Jack Chavez has been doing well. No chest pain. No significant shortness of breath with exertion. Stable orthopnea and no PND. Lower extremity edema has resolved. No recent fevers or illnesses. No abnormal bleeding or bruising.   In the ED, Jack Chavez mildly hypertensive but vitals stable. EKG showed no acute ischemic changes. BNP normal. Chest x-ray showed resolution of previously seen right-sided infiltrate and effusion. WBC 6.5, Hgb 14.7, Plts 292. Na 141, K 4.7, Glucose 107, BUN 31, Cr 1.76. COVID-19 negative.  No recurrent dizziness in the ED.   Past Medical History:  Diagnosis Date  . HTN (hypertension)   . Morbid obesity (HCC)     Past Surgical History:  Procedure Laterality Date  . KNEE SURGERY    . RIGHT/LEFT HEART CATH AND CORONARY ANGIOGRAPHY N/A 09/28/2019   Procedure: RIGHT/LEFT HEART CATH AND CORONARY ANGIOGRAPHY;  Surgeon: Swaziland, Peter M, MD;  Location: [facility] INVASIVE CV LAB;  Service: Cardiovascular;  Laterality: N/A;  . SHOULDER SURGERY       Home Medications:  Prior  to Admission medications   Medication Sig Start Date End Date Taking? Authorizing Provider  allopurinol (ZYLOPRIM) 100 MG tablet Take 1 tablet (100 mg total) by mouth daily. 10/04/19  Yes Rai, Ripudeep K, MD  amLODipine (NORVASC) 5 MG tablet  Take 5 mg by mouth daily.   Yes [provider]  carvedilol (COREG) 25 MG tablet Take 1 tablet (25 mg total) by mouth 2 (two) times daily with a meal. 10/03/19  Yes Rai, Ripudeep K, MD  colchicine 0.6 MG tablet Take 0.6 mg by mouth 2 (two) times daily. 11/01/19  Yes [provider]  Cyanocobalamin (VITAMIN B12) 3000 MCG/ML LIQD Place 1 mL under the tongue daily.   Yes [provider]  diclofenac Sodium (VOLTAREN) 1 % GEL Apply 2 g topically 4 (four) times daily. 10/03/19  Yes Rai, Ripudeep K, MD  furosemide (LASIX) 40 MG tablet Take 1 tablet (40 mg total) by mouth daily. 10/04/19  Yes Rai, Ripudeep K, MD  Garlic 10 MG CAPS Take 2 capsules by mouth in the morning and at bedtime.   Yes [provider]  hydrALAZINE (APRESOLINE) 50 MG tablet Take 1.5 tablets (75 mg total) by mouth 3 (three) times daily. 11/02/19  Yes Kathleene Hazel, MD  isosorbide dinitrate (ISORDIL) 40 MG tablet Take 1 tablet (40 mg total) by mouth 3 (three) times daily. 11/02/19  Yes Kathleene Hazel, MD  Magnesium 500 MG TABS Take 3 tablets by mouth daily.   Yes [provider]  Probiotic Product (PROBIOTIC PO) Take 1 capsule by mouth daily.   Yes [provider]  TURMERIC PO Take 1 capsule by mouth every other day.   Yes [provider]  VITAMIN A PO Take 1 tablet by mouth every other day.   Yes [provider]    Inpatient Medications: Scheduled Meds: . allopurinol  100 mg Oral Daily  . carvedilol  25 mg Oral BID WC  . colchicine  0.6 mg Oral BID  . diclofenac Sodium  2 g Topical QID  . enoxaparin (LOVENOX) injection  70 mg Subcutaneous QHS  . furosemide  40 mg Oral Daily  . hydrALAZINE  75 mg Oral TID  . isosorbide dinitrate  40 mg Oral TID  . magnesium oxide  1,200 mg Oral Daily  . sodium chloride flush  3 mL Intravenous Q12H  . vitamin B-12  1,000 mcg Oral Daily   Continuous Infusions:  PRN Meds: acetaminophen **OR** acetaminophen,  ondansetron **OR** ondansetron (ZOFRAN) IV  Allergies:    Allergies  Allergen Reactions  . Diclofenac Swelling  . Morphine And Related     Jack Chavez reports "stomach tightness" in the past related to morphine use and does not remember what other reactions it caused.    Social History:   Social History   Socioeconomic History  . Marital status: Single    Spouse name: Not on file  . Number of children: Not on file  . Years of education: Not on file  . Highest education level: Not on file  Occupational History  . Not on file  Tobacco Use  . Smoking status: Former Smoker    Types: Cigars  . Smokeless tobacco: Never Used  . Tobacco comment: Occasional cigars in the 1980s  Substance and Sexual Activity  . Alcohol use: Yes    Comment: occasional - just few drinks a month  . Drug use: Never  . Sexual activity: Not on file  Other Topics Concern  . Not on file  Social History Narrative  . Not on file   Social Determinants of Health   Financial Resource Strain:   . Difficulty of Paying Living Expenses:   Food Insecurity:   . Worried About Charity fundraiser in the Last Year:   . Arboriculturist in the Last Year:   Transportation Needs:   . Film/video editor (Medical):   Marland Kitchen Lack of Transportation (Non-Medical):   Physical Activity:   . Days of Exercise per Week:   . Minutes of Exercise per Session:   Stress:   . Feeling of Stress :   Social Connections:   . Frequency of Communication with Friends and Family:   . Frequency of Social Gatherings with Friends and Family:   . Attends Religious Services:   . Active Member of Clubs or Organizations:   . Attends Archivist Meetings:   Marland Kitchen Marital Status:   Intimate Partner Violence:   . Fear of Current or Ex-Partner:   . Emotionally Abused:   Marland Kitchen Physically Abused:   . Sexually Abused:     Family History:    Family History  Problem Relation Age of Onset  . Diabetes Father   . Diabetes Paternal Grandmother       ROS:  Please see the history of present illness.  All other ROS reviewed and negative.     Physical Exam/Data:   Vitals:   11/06/19 0541 11/06/19 0600 11/06/19 0700 11/06/19 0800  BP: 128/76 127/80 140/63 139/82  Pulse: 84 86 81 85  Resp: 14   17  Temp:      TempSrc:      SpO2: 95% 91% 95% 94%   No intake or output data in the 24 hours ending 11/06/19 0942 Last 3 Weights 10/18/2019 10/03/2019 10/02/2019  Weight (lbs) 301 lb 306 lb 7 oz 299 lb 13.2 oz  Weight (kg) 136.533 kg 139 kg 136 kg     There is no height or weight on file to calculate BMI.  General: 64 y.o. obese African-American male resting comfortably in no acute distress. HEENT: Normocephalic and atraumatic. Sclera clear.  Neck: Supple. JVD difficult to assess due to body habitus.  Heart: RRR. Distinct S1 and S2. No murmurs, gallops, or rubs. Radial and distal pedal pulses 2+ and equal bilaterally. Lungs: No increased work of breathing. Clear to ausculation bilaterally. No wheezes, rhonchi, or rales.  Abdomen: Soft, non-distended, and non-tender to palpation. Bowel sounds present. MSK: Normal strength and tone for age. Extremities: No lower extremity edema.    Skin: Warm and dry. Neuro: Alert and oriented x3. No focal deficits. Psych: Normal affect. Responds appropriately.   EKG:  The EKG was personally reviewed and demonstrates:  Normal sinus rhythm, rate 82 bpm, with non-specific ST/T changes in inferior leads. Borderline 1st degree AV block.  Telemetry:  Telemetry was personally reviewed and demonstrates:  Normal sinus rhythm with rates in the 80's to 90's.  Relevant CV Studies:  Echocardiogram 09/20/2019: Impressions: 1. Severe global reduction in LV systolic function; moderate LVE; grade 2  diastolic dysfunction; biatrial enlargement; severe RV dysfunction; mild  TR with severe pulmonary hypertension.  2. Left ventricular ejection fraction, by estimation, is 20 to 25%. The  left ventricle has severely  decreased function. The left ventricle  demonstrates global hypokinesis. The left ventricular internal cavity size  was moderately dilated. Left  ventricular diastolic parameters are consistent with Grade II diastolic  dysfunction (pseudonormalization). Elevated left atrial pressure.  3. Right ventricular systolic  function is severely reduced. The right  ventricular size is normal. There is severely elevated pulmonary artery  systolic pressure.  4. Left atrial size was mildly dilated.  5. Right atrial size was moderately dilated.  6. The mitral valve is normal in structure. Trivial mitral valve  regurgitation. No evidence of mitral stenosis.  7. The aortic valve is tricuspid. Aortic valve regurgitation is not  visualized. No aortic stenosis is present.  8. The inferior vena cava is dilated in size with <50% respiratory  variability, suggesting right atrial pressure of 15 mmHg.  _______________  Right/Left Heart Catheterization 09/28/2019:  LV end diastolic pressure is moderately elevated.  Hemodynamic findings consistent with severe pulmonary hypertension.   1. Normal coronary anatomy 2. Moderately elevated LV filling pressures. 3. Severely elevated right heart filling pressures 4. Severe pulmonary HTN 5. Preserved cardiac output 6. Chronic severe respiratory failure. PH 7.36, PCO2 71, Bicarb 40. P02 152. Suspect chronic hypoventilation.   Laboratory Data:  High Sensitivity Troponin:  No results for input(s): TROPONINIHS in the last 720 hours.   Chemistry Recent Labs  Lab 11/05/19 1559  NA 141  K 4.7  CL 104  CO2 27  GLUCOSE 107*  BUN 31*  CREATININE 1.76*  CALCIUM 8.9  GFRNONAA 41*  GFRAA 47*  ANIONGAP 10    No results for input(s): PROT, ALBUMIN, AST, ALT, ALKPHOS, BILITOT in the last 168 hours. Hematology Recent Labs  Lab 11/05/19 1559  WBC 6.5  RBC 5.29  HGB 14.7  HCT 47.2  MCV 89.2  MCH 27.8  MCHC 31.1  RDW 15.9*  PLT 292   BNP Recent Labs    Lab 11/05/19 1559  BNP 99.7    DDimer No results for input(s): DDIMER in the last 168 hours.   Radiology/Studies:  DG Chest 2 View  Result Date: 11/05/2019 CLINICAL DATA:  Dizziness and hypotension EXAM: CHEST - 2 VIEW COMPARISON:  09/19/2019 FINDINGS: Cardiac shadow is stable. Previously seen right basilar effusion and infiltrate as resolved in the interval. No focal abnormality is seen. Degenerative change of the thoracic spine is noted. IMPRESSION: Resolution of previously seen right-sided infiltrate and effusion. Electronically Signed   By: Alcide Clever M.D.   On: 11/05/2019 16:49    Assessment and Plan:   Dizziness/ Hypotension - Jack Chavez presented after an episode of dizziness/hypotension. Occurred after having a BM. BP reportedly in the 70's/30's when EMS arrived but has been on the hypertensive side while in the ED.  - Orthostatic vital signs negative. - Telemetry reveals no concerning arrhythmias.  - Recent Echo showed LVEF of 20-25%. Normal coronaries on recent cath. - Amlodipine was discontinued at most recent office visit but Jack Chavez continued to take this. Will stop Amlodipine.  - Creatinine slightly elevated from baseline so Jack Chavez may be a little dehydrated. Also may be vasovagal component given this occurred following a bowel movement.  - Will continue other medications for CHF. Advised Jack Chavez to keep a log of BP/heart rate and bring to follow-up visit.  - Do not think any additional inpatient cardiac work-up is needed at this time. However,  Will order 30 day Event Monitor given reduced EF. Jack Chavez already as follow-up with Dr. Clifton James scheduled for 12/03/2019.  Chronic Combined CHF/Non-Ischemic Cardiomyopathy - Recent Echo on 09/20/2019 showed LVEF of 20-25% with moderately dilated LV, global hypokinesis, and grade 2 diastolic dysfunction. Also noted to have severely reduced RV function and severe pulmonary hypertension. Right/left heart catheterization on 09/28/2019 showed  normal coronaries as well as  moderately elevated LV filling pressures, severely elevated right heart filling pressure, and severe pulmonary hypertension but preserved cardiac output. - BNP normal. Chest x-ray shows no edema.  - Appears euvolemic on exam.  - Continue Lasix 40mg  daily.  - Continue Coreg 25mg  twice daily, Hydralazine 75mg  three times daily, and Isordil 40mg  three times daily.  - No ACEi/ARB/ARNI due to renal function and soft BP at times. - Continue fluid and sodium restrictions.  Severe Pulmonary Hypertension - Felt to be secondary to obesity hypoventilation syndrome and sleep apnea. - Needs outpatient sleep study.  CKD Stage III - Creatinine 1.76 on presentation. Recent baseline in 1.3 to 1.5 range. - Possibly some pre-renal AKI due to soft BP with continuation of Amlodipine. May have also been a little dehydrated. - Recommend oral hydration.   For questions or updates, please contact [facility] Please consult [email or web address] for contact info under     Signed, , PA-C  11/06/2019 9:42 AM  Jack Chavez seen and examined with PA-C.  Agree as above, with the following exceptions and changes as noted below.  Jack Chavez is a pleasant 64 year old gentleman with a nonischemic cardiomyopathy and ejection fraction of 20 to 25% who experienced near syncope with hypotension and lightheadedness.  This occurred with a bowel movement.  He was intended to stop his amlodipine, however due to some confusion with instructions this was not stopped by his rehab facility.  He is feeling well now, is hypertensive, sense of current presyncope or palpitations. Gen: NAD, CV: RRR, no murmurs, Lungs: clear, Abd: soft, Extrem: Warm, well perfused, no edema, Neuro/Psych: alert and oriented x 3, normal mood and affect. All available labs, radiology testing, previous records reviewed.  We will stop his amlodipine which may have contributed to hypotension.  I am concerned about a  gentleman with an ejection fraction of 20 to 25% episode of near syncope suggesting compromised cardiac output.  This occurred during a bowel movement.  We will need to exclude arrhythmia in the setting of his cardiomyopathy, and will order an event monitor for him.  Fortunately his blood pressure appears to have recovered.  Could consider cardiac MRI if not already performed elsewhere for further work-up of his cardiomyopathy in the setting of near syncope.  Follow-up has been arranged with his primary cardiologist for further evaluation.  Discussed with Dr. 01/06/2020.  Marjie Skiff, MD 11/06/19 2:12 PM

## 2019-11-07 ENCOUNTER — Telehealth: Payer: Self-pay | Admitting: Cardiovascular Disease

## 2019-11-07 NOTE — Telephone Encounter (Signed)
His sister called stating patient was recently in the hospital.  The nurse recommended he would a good candidate to go somewhere for his OT/PT instead of doing it at home. If this is possible his sister would like a referral.

## 2019-11-07 NOTE — Telephone Encounter (Signed)
The patient's daughter states that the patient was in inpatient rehab for a little bit over a month until last Thursday. At rehab they forgot to discontinue his amlodipine and his blood pressure dropped on Monday so he was taken to [facility] ER. It was recommended that he see PT/OT. The daughter states that since the patient has heart failure she would like to know if Dr. Clifton James thinks that the patient would benefit more from outpatient therapy or at home PT/OT.

## 2019-11-08 ENCOUNTER — Telehealth: Payer: Self-pay | Admitting: *Deleted

## 2019-11-08 ENCOUNTER — Encounter: Payer: Self-pay | Admitting: *Deleted

## 2019-11-08 NOTE — Telephone Encounter (Signed)
Called patient's wife back with Dr. Gibson Ramp recommendations. Patient's wife stated her questions was if Dr. Clifton James thinks patient is able to do outpatient PT/OT or should he do Home PT/OT. She does not want patient to do too much too soon. Patient does not have a PCP to refer to. Patient's wife knows patient needs PT/OT, but she does not know what the next step is as far as getting PT/OT. Informed her that our office refers a lot of patient's to cardiac rehab. Will forward to Dr. Clifton James and his nurse for advisement.

## 2019-11-08 NOTE — Telephone Encounter (Signed)
Which would they prefer? I think either would be beneficial for him.  Jack Chavez

## 2019-11-08 NOTE — Telephone Encounter (Signed)
[employer] 30 day cardiac event monitor to be shipped to [facility] c/o  [facility] [address] [place], [place] [address].  Letter with instructions to be mailed to same address.  Instructions will be included in the monitor kit as well.

## 2019-11-08 NOTE — Telephone Encounter (Signed)
I spoke with patient's sister. She reports patient was at rehab facility for 7 weeks. Was discharged on 5/6 with home PT/OT planned to start on 5/10.  Patient was in the hospital on 5/10 so PT/OT was postponed.  Discharge summary from 5/11 notes patient does have therapy services coming out to his home at discharge. Patient's sister reports during most recent hospitalization the physician told her patient may benefit from going to an outpatient PT/OT office or "Heart Stride" type program in place of home therapy. Sister reports home PT/OT has been placed on hold until type of therapy needed is determined   Sister would like to do what is best for patient and she is asking if Jack Chavez feels he is strong enough to attend therapy outside of the home or if patient should have home PT/OT at this time.  She is able to transport patient to therapy if Jack Chavez thinks this is the type of therapy patient needs. She reports at this time patient is able to ambulate for short distances in the home.

## 2019-11-11 NOTE — Telephone Encounter (Signed)
I answered this in a MyChart message. Jack Chavez

## 2019-11-12 ENCOUNTER — Ambulatory Visit: Payer: BLUE CROSS/BLUE SHIELD | Admitting: Cardiovascular Disease

## 2019-11-15 ENCOUNTER — Other Ambulatory Visit: Payer: Self-pay

## 2019-11-15 MED ORDER — FUROSEMIDE 40 MG PO TABS: 40.0000 mg | ORAL_TABLET | Freq: Every day | ORAL | 3 refills | Status: DC

## 2019-11-15 MED ORDER — CARVEDILOL 25 MG PO TABS: 25.0000 mg | ORAL_TABLET | Freq: Two times a day (BID) | ORAL | 3 refills | Status: DC

## 2019-11-15 NOTE — Telephone Encounter (Signed)
Pt's sister is requesting a refill on allopurinol and colchicine. Pt has an upcoming appt on December 04, 2019 with a new PCP and they will not refill these medications until pt has been seen. They would like to know if Dr. Clifton James would refill these medications until pt is seen by PCP. Please address

## 2019-11-15 NOTE — Telephone Encounter (Signed)
Pt's medications were sent to pt's pharmacy as requested. Confirmation received.  

## 2019-11-16 MED ORDER — COLCHICINE 0.6 MG PO TABS: 0.6000 mg | ORAL_TABLET | Freq: Two times a day (BID) | ORAL | 0 refills | Status: AC

## 2019-11-16 MED ORDER — ALLOPURINOL 100 MG PO TABS: 100.0000 mg | ORAL_TABLET | Freq: Every day | ORAL | 0 refills | Status: AC

## 2019-11-16 NOTE — Telephone Encounter (Signed)
Follow up  Pt's sister calling back to follow up refill request. She said pt is completely out as of today

## 2019-11-16 NOTE — Telephone Encounter (Signed)
Spoke with patients sister, Efraim Kaufmann and made her aware that thirty day rx's have been sent in to requested pharmacy and that further refills will be need to be addressed by PCP. She verbalized her understanding and appreciation for the return call.

## 2019-11-16 NOTE — Telephone Encounter (Signed)
*  STAT* If patient is at the pharmacy, call can be transferred to refill team.   1. Which medications need to be refilled? (please list name of each medication and dose if known)  Please call in today- he is completely out-  Allopurinol and Colchicine  2. Which pharmacy/location (including street and city if local pharmacy) is medication to be sent to? [facility] [address], [place]  3. Do they need a 30 day or 90 day supply?  90 days and refills

## 2019-11-16 NOTE — Telephone Encounter (Signed)
Dr. Clifton James saw this patient as a consult in the hospital in March.  He was seen in office once in April by APP.  He now has a hospital f/u with Dr. Clifton James on June 7.  Will route to MD to see if ok to fill for 30 days.  Pt has new PCP appt on June 8.

## 2019-11-16 NOTE — Telephone Encounter (Signed)
OK to refill. Thanks, chris 

## 2019-11-22 ENCOUNTER — Ambulatory Visit (INDEPENDENT_AMBULATORY_CARE_PROVIDER_SITE_OTHER): Payer: BLUE CROSS/BLUE SHIELD

## 2019-11-22 DIAGNOSIS — R42 Dizziness and giddiness: Secondary | ICD-10-CM | POA: Diagnosis not present

## 2019-11-22 DIAGNOSIS — I421 Obstructive hypertrophic cardiomyopathy: Secondary | ICD-10-CM

## 2019-12-03 ENCOUNTER — Encounter: Payer: Self-pay | Admitting: Cardiovascular Disease

## 2019-12-03 ENCOUNTER — Ambulatory Visit: Payer: BLUE CROSS/BLUE SHIELD | Admitting: Cardiovascular Disease

## 2019-12-03 ENCOUNTER — Other Ambulatory Visit: Payer: Self-pay

## 2019-12-03 ENCOUNTER — Telehealth (HOSPITAL_COMMUNITY): Payer: Self-pay | Admitting: *Deleted

## 2019-12-03 VITALS — BP 124/60 | HR 80 | Ht 73.0 in | Wt 305.8 lb

## 2019-12-03 DIAGNOSIS — I420 Dilated cardiomyopathy: Secondary | ICD-10-CM | POA: Diagnosis not present

## 2019-12-03 DIAGNOSIS — I272 Pulmonary hypertension, unspecified: Secondary | ICD-10-CM

## 2019-12-03 NOTE — Patient Instructions (Signed)
Medication Instructions:  No changes *If you need a refill on your cardiac medications before your next appointment, please call your pharmacy*   Lab Work: none If you have labs (blood work) drawn today and your tests are completely normal, you will receive your results only by: Marland Kitchen MyChart Message (if you have MyChart) OR . A paper copy in the mail If you have any lab test that is abnormal or we need to change your treatment, we will call you to review the results.   Testing/Procedures: Your physician has requested that you have an echocardiogram. Echocardiography is a painless test that uses sound waves to create images of your heart. It provides your doctor with information about the size and shape of your heart and how well your heart's chambers and valves are working. This procedure takes approximately one hour. There are no restrictions for this procedure.  Your physician has recommended that you have a sleep study. This test records several body functions during sleep, including: brain activity, eye movement, oxygen and carbon dioxide blood levels, heart rate and rhythm, breathing rate and rhythm, the flow of air through your mouth and nose, snoring, body muscle movements, and chest and belly movement.   Follow-Up: At [facility], you and your health needs are our priority.  As part of our continuing mission to provide you with exceptional heart care, we have created designated Provider Care Teams.  These Care Teams include your primary Cardiologist (physician) and Advanced Practice Providers (APPs -  Physician Assistants and Nurse Practitioners) who all work together to provide you with the care you need, when you need it.  We recommend signing up for the patient portal called "MyChart".  Sign up information is provided on this After Visit Summary.  MyChart is used to connect with patients for Virtual Visits (Telemedicine).  Patients are able to view lab/test results, encounter notes,  upcoming appointments, etc.  Non-urgent messages can be sent to your provider as well.   To learn more about what you can do with MyChart, go to [email or web address].    Your next appointment:   3 month(s)  The format for your next appointment:   In Person  Provider:   You may see Verne Carrow, MD or one of the following Advanced Practice Providers on your designated Care Team:    Ronie Spies, PA-C  Jacolyn Reedy, PA-C    Other Instructions You have been referred to Cardiopulmonary Rehab

## 2019-12-03 NOTE — Telephone Encounter (Signed)
Received referral from Dr. Clifton James for this pt to participate in cardiac rehab with the diagnosis of systolic heart failure.  Noted upon review of pt demographics and insurance that he has BCBS non participating. [facility] is out of network for this particular plan and [facility] providers are considered in network and he can participate with [facility] at a higher out of pocket cost.  Called to confirm this with the patient for his understanding of his coverage/benefits.  Left message requesting a call back. Contact information provided. Alanson Aly, BSN Cardiac and Emergency planning/management officer

## 2019-12-03 NOTE — Progress Notes (Signed)
Chief Complaint  Patient presents with  . Follow-up    HTN     History of Present Illness: 64 yo male with history of HTN, systolic CHF and arthritis here today for follow up. He was admitted to [facility] March 2021 with acute CHF. Workup in the hospital revealed new onset kidney insufficiency of unknown duration, mild hyperkalemia, cirrhotic changes of the liver on [place], ascites, and biventricular heart failure with severe pulmonary HTN. 2D echo showed EF 20-25%, global HK, moderately dilated LV, grade 2 DD, severely reduced RV function with severe pulmonary THN, mild LAE, moderate RAE, dilated IVC. He received Lokelma for his K of 5.4. BNP 1446, TSH wnl, Covid and HIV negative. Cardiac cath with no evidence of CAD. He had a massive amount of fluid removed while he was admitted (32 liters and 85 lbs).  He was discharged on Coreg, Bidil and Lasix with plans to consider Entresto pending renal function. Admitted to [facility] May 2021 with near syncope while having a bowel movement and was seen by cardiology. Cardiac event monitor is still in progress.    He is here today for follow up. The patient denies any chest pain, dyspnea, palpitations, lower extremity edema, orthopnea, PND, dizziness, near syncope or syncope. Overall feeling well.    Primary Care Physician: Felipa Furnace, NP   Past Medical History:  Diagnosis Date  . Chronic systolic CHF (congestive heart failure), NYHA class 2 (HCC)   . CKD (chronic kidney disease)   . HTN (hypertension)   . Morbid obesity (HCC)   . Non-ischemic cardiomyopathy [facility])     Past Surgical History:  Procedure Laterality Date  . KNEE SURGERY    . RIGHT/LEFT HEART CATH AND CORONARY ANGIOGRAPHY N/A 09/28/2019   Procedure: RIGHT/LEFT HEART CATH AND CORONARY ANGIOGRAPHY;  Surgeon: Swaziland, Peter M, MD;  Location: [facility] INVASIVE CV LAB;  Service: Cardiovascular;  Laterality: N/A;  . SHOULDER SURGERY      Current Outpatient Medications  Medication Sig Dispense Refill  .  allopurinol (ZYLOPRIM) 100 MG tablet Take 1 tablet (100 mg total) by mouth daily. Further refills need to be addressed by PCP. 30 tablet 0  . carvedilol (COREG) 25 MG tablet Take 1 tablet (25 mg total) by mouth 2 (two) times daily with a meal. 180 tablet 3  . colchicine 0.6 MG tablet Take 1 tablet (0.6 mg total) by mouth 2 (two) times daily. Further refills need to be addressed by PCP. 60 tablet 0  . Cyanocobalamin (VITAMIN B12) 3000 MCG/ML LIQD Place 1 mL under the tongue daily.    . diclofenac Sodium (VOLTAREN) 1 % GEL Apply 2 g topically 4 (four) times daily.    . furosemide (LASIX) 40 MG tablet Take 1 tablet (40 mg total) by mouth daily. 90 tablet 3  . Garlic 10 MG CAPS Take 2 capsules by mouth in the morning and at bedtime.    . hydrALAZINE (APRESOLINE) 50 MG tablet Take 1.5 tablets (75 mg total) by mouth 3 (three) times daily. 405 tablet 3  . isosorbide dinitrate (ISORDIL) 40 MG tablet Take 1 tablet (40 mg total) by mouth 3 (three) times daily. 270 tablet 3  . Magnesium 500 MG TABS Take 3 tablets by mouth daily.    . Probiotic Product (PROBIOTIC PO) Take 1 capsule by mouth daily.    . TURMERIC PO Take 1 capsule by mouth every other day.    Marland Kitchen VITAMIN A PO Take 1 tablet by mouth every other day.  No current facility-administered medications for this visit.    Allergies  Allergen Reactions  . Diclofenac Swelling  . Morphine And Related     Patient reports "stomach tightness" in the past related to morphine use and does not remember what other reactions it caused.    Social History   Socioeconomic History  . Marital status: Single    Spouse name: Not on file  . Number of children: Not on file  . Years of education: Not on file  . Highest education level: Not on file  Occupational History  . Not on file  Tobacco Use  . Smoking status: Former Smoker    Types: Cigars  . Smokeless tobacco: Never Used  . Tobacco comment: Occasional cigars in the 1980s  Substance and Sexual  Activity  . Alcohol use: Yes    Comment: occasional - just few drinks a month  . Drug use: Never  . Sexual activity: Not on file  Other Topics Concern  . Not on file  Social History Narrative  . Not on file   Social Determinants of Health   Financial Resource Strain:   . Difficulty of Paying Living Expenses:   Food Insecurity:   . Worried About Charity fundraiser in the Last Year:   . Arboriculturist in the Last Year:   Transportation Needs:   . Film/video editor (Medical):   Marland Kitchen Lack of Transportation (Non-Medical):   Physical Activity:   . Days of Exercise per Week:   . Minutes of Exercise per Session:   Stress:   . Feeling of Stress :   Social Connections:   . Frequency of Communication with Friends and Family:   . Frequency of Social Gatherings with Friends and Family:   . Attends Religious Services:   . Active Member of Clubs or Organizations:   . Attends Archivist Meetings:   Marland Kitchen Marital Status:   Intimate Partner Violence:   . Fear of Current or Ex-Partner:   . Emotionally Abused:   Marland Kitchen Physically Abused:   . Sexually Abused:     Family History  Problem Relation Age of Onset  . Diabetes Father   . Diabetes Paternal Grandmother     Review of Systems:  As stated in the HPI and otherwise negative.   BP 124/60   Pulse 80   Ht 6\' 1"  (1.854 m)   Wt (!) 305 lb 12.8 oz (138.7 kg)   SpO2 96%   BMI 40.35 kg/m   Physical Examination: General: Well developed, well nourished, NAD  HEENT: OP clear, mucus membranes moist  SKIN: warm, dry. No rashes. Neuro: No focal deficits  Musculoskeletal: Muscle strength 5/5 all ext  Psychiatric: Mood and affect normal  Neck: No JVD, no carotid bruits, no thyromegaly, no lymphadenopathy.  Lungs:Clear bilaterally, no wheezes, rhonci, crackles Cardiovascular: Regular rate and rhythm. No murmurs, gallops or rubs. Abdomen:Soft. Bowel sounds present. Non-tender.  Extremities: No lower extremity edema. Pulses are 2 +  in the bilateral DP/PT.  EKG:  EKG is not ordered today. The ekg ordered today demonstrates   Recent Labs: 09/21/2019: TSH 0.710 09/24/2019: ALT 12 09/25/2019: Magnesium 2.0 11/05/2019: B Natriuretic Peptide 99.7; BUN 31; Creatinine, Ser 1.76; Hemoglobin 14.7; Platelets 292; Potassium 4.7; Sodium 141   Lipid Panel    Component Value Date/Time   CHOL 150 09/22/2019 0556   TRIG 81 09/22/2019 0556   HDL 33 (L) 09/22/2019 0556   CHOLHDL 4.5 09/22/2019 0556   VLDL  16 09/22/2019 0556   LDLCALC 101 (H) 09/22/2019 0556     Wt Readings from Last 3 Encounters:  12/03/19 (!) 305 lb 12.8 oz (138.7 kg)  10/18/19 (!) 301 lb (136.5 kg)  10/03/19 (!) 306 lb 7 oz (139 kg)    Assessment and Plan:   1. Chronic systolic CHF/NICM: Weight stable. No volume overload. Continue beta blocker, Bidil and Lasix. Will not add Ace-inh/ARB or Entresto due to CKD. Will repeat echo next month. If no improvement in LVEF, will need to consider EP referral for ICD (consdier cardiac MRI at that time). Referral to cardiac rehab.   2. Severe pulmonary HTN: Likely due to obesity hypoventilation syndrome and sleep apnea.  Will refer for sleep study.   3. Morbid obesity: weight loss recommended  4. CKD, stage III: renal function is stable.   Current medicines are reviewed at length with the patient today.  The patient does not have concerns regarding medicines.  The following changes have been made:  no change  Labs/ tests ordered today include:   Orders Placed This Encounter  Procedures  . AMB referral to cardiac rehabilitation  . ECHOCARDIOGRAM COMPLETE  . Split night study     Disposition:   FU with me in 3 months.    Signed, Verne Carrow, MD 12/03/2019 12:22 PM    [facility] [address] [place], [place], [place]  [address] Phone: [phone]; Fax: [phone]

## 2019-12-04 ENCOUNTER — Telehealth: Payer: Self-pay | Admitting: *Deleted

## 2019-12-04 NOTE — Telephone Encounter (Signed)
-----   Message from [name], RN sent at 12/03/2019 12:04 PM EDT ----- Regarding: sleep study Split night study ordered. Pt aware he will be contacted after authorization. Thank you! Jack Chavez

## 2019-12-05 ENCOUNTER — Telehealth (HOSPITAL_COMMUNITY): Payer: Self-pay

## 2019-12-05 NOTE — Telephone Encounter (Signed)
Pt insurance is active and benefits verified through BCBS Co-pay 0, DED $3,750/$3,750 met, out of pocket 0/0 met, co-insurance 70%. no pre-authorization required. Passport, JenF/BCBS 12/05/2019@2 :04pm, REF# [id number]  Will contact patient to see if he is interested in the Cardiac Rehab Program. If interested, patient will need to complete follow up appt. Once completed, patient will be contacted for scheduling upon review by the RN Navigator.

## 2019-12-06 NOTE — Telephone Encounter (Signed)
Pt returned CR phone call, adv pt [facility] is a non-participating with her insurance. And he will have a 70% co-insurance. Pt stated he would like to go to [facility], will fax referral over.  Closed referral

## 2019-12-21 ENCOUNTER — Other Ambulatory Visit (HOSPITAL_COMMUNITY): Payer: BLUE CROSS/BLUE SHIELD

## 2019-12-26 ENCOUNTER — Other Ambulatory Visit: Payer: Self-pay | Admitting: Student

## 2019-12-26 DIAGNOSIS — I421 Obstructive hypertrophic cardiomyopathy: Secondary | ICD-10-CM

## 2019-12-26 DIAGNOSIS — R42 Dizziness and giddiness: Secondary | ICD-10-CM

## 2020-01-11 ENCOUNTER — Ambulatory Visit (HOSPITAL_COMMUNITY): Payer: BLUE CROSS/BLUE SHIELD | Attending: Cardiovascular Disease

## 2020-01-11 ENCOUNTER — Other Ambulatory Visit: Payer: Self-pay

## 2020-01-11 DIAGNOSIS — I420 Dilated cardiomyopathy: Secondary | ICD-10-CM

## 2020-01-11 DIAGNOSIS — I272 Pulmonary hypertension, unspecified: Secondary | ICD-10-CM | POA: Diagnosis present

## 2020-01-11 LAB — ECHOCARDIOGRAM COMPLETE
Area-P 1/2: 4.8 cm2
S' Lateral: 3.3 cm

## 2020-01-14 ENCOUNTER — Telehealth: Payer: Self-pay | Admitting: Cardiovascular Disease

## 2020-01-14 NOTE — Telephone Encounter (Signed)
Patient returning call for echo results. 

## 2020-01-14 NOTE — Telephone Encounter (Signed)
Kathleene Hazel, MD  01/14/2020 8:53 AM EDT     His heart is back to normal strength. That is great news. Valves ok. Can we let him know? Thanks, chris   The patient has been notified of the result and verbalized understanding.  All questions (if any) were answered. Loa Socks, LPN 02/05/5725 2:03 PM

## 2020-01-28 ENCOUNTER — Telehealth: Payer: Self-pay | Admitting: Cardiovascular Disease

## 2020-01-28 NOTE — Telephone Encounter (Signed)
Spoke with Melissa at cardiac rehab at [facility]. 86/60, little bit more slow to respond.  Lightheaded, dizzy.  Suggested ER to patient.  Does not want to go.  88/62 reclining.  He is symptomatic w low bp.  I let her know I am in agreement w recommendation to go to the ER.  She is going to let him know and also discuss with the person that comes to pick him up today.

## 2020-01-28 NOTE — Telephone Encounter (Signed)
New Message  Melissa from [facility] is calling to speak with triage nurse about patient's blood pressure. Transferred to triage.

## 2020-01-29 NOTE — Telephone Encounter (Signed)
Left message for patient to call back  

## 2020-01-29 NOTE — Telephone Encounter (Signed)
He is on Coreg 25 mg BID. Can we have him cut it back to 12.5 mg BID? Follow BP for several days. We may need to cut back on his hydralazine as well if BP remains low. Jack Chavez

## 2020-01-30 MED ORDER — CARVEDILOL 25 MG PO TABS: 12.5000 mg | ORAL_TABLET | Freq: Two times a day (BID) | ORAL | 3 refills | Status: DC

## 2020-01-30 NOTE — Telephone Encounter (Signed)
Patient returned call

## 2020-01-30 NOTE — Addendum Note (Signed)
Addended by: [name] on: 01/30/2020 04:40 PM   Modules accepted: Orders

## 2020-01-30 NOTE — Telephone Encounter (Signed)
Spoke with patient. His BP has come up some 110/68 today.  He monitors at home.  Feeling better. Will cut coreg in 1/2 to 12.5 mg twice a day and monitor BPs.  Adv to call back if getting readings SBP<100.

## 2020-03-26 ENCOUNTER — Ambulatory Visit: Payer: BLUE CROSS/BLUE SHIELD | Admitting: Cardiovascular Disease

## 2020-03-26 NOTE — Progress Notes (Deleted)
No chief complaint on file.    History of Present Illness: 64 yo male with history of HTN, systolic CHF and arthritis here today for follow up. He was admitted to [facility] March 2021 with acute CHF. Workup in the hospital revealed new onset kidney insufficiency of unknown duration, mild hyperkalemia, cirrhotic changes of the liver on [place], ascites, and biventricular heart failure with severe pulmonary HTN. 2D echo showed EF 20-25%, global HK, moderately dilated LV, grade 2 DD, severely reduced RV function with severe pulmonary THN, mild LAE, moderate RAE, dilated IVC. He received Lokelma for his K of 5.4. BNP 1446, TSH wnl, Covid and HIV negative. Cardiac cath with no evidence of CAD. He had a massive amount of fluid removed while he was admitted (32 liters and 85 lbs).  He was discharged on Coreg, Bidil and Lasix with plans to consider Entresto pending renal function. Admitted to [facility] May 2021 with near syncope while having a bowel movement and was seen by cardiology. Cardiac event monitor June 2021 with 4 runs of NSVT, longest run 6 beats. Echo July 2021 with LVEF=60-65%, no valve disease.   He is here today for follow up. The patient denies any chest pain, dyspnea, palpitations, lower extremity edema, orthopnea, PND, dizziness, near syncope or syncope.   Primary Care Physician: Felipa Furnace, NP   Past Medical History:  Diagnosis Date  . Chronic systolic CHF (congestive heart failure), NYHA class 2 (HCC)   . CKD (chronic kidney disease)   . HTN (hypertension)   . Morbid obesity (HCC)   . Non-ischemic cardiomyopathy [facility])     Past Surgical History:  Procedure Laterality Date  . KNEE SURGERY    . RIGHT/LEFT HEART CATH AND CORONARY ANGIOGRAPHY N/A 09/28/2019   Procedure: RIGHT/LEFT HEART CATH AND CORONARY ANGIOGRAPHY;  Surgeon: Swaziland, Peter M, MD;  Location: [facility] INVASIVE CV LAB;  Service: Cardiovascular;  Laterality: N/A;  . SHOULDER SURGERY      Current Outpatient Medications  Medication  Sig Dispense Refill  . allopurinol (ZYLOPRIM) 100 MG tablet Take 1 tablet (100 mg total) by mouth daily. Further refills need to be addressed by PCP. 30 tablet 0  . carvedilol (COREG) 25 MG tablet Take 0.5 tablets (12.5 mg total) by mouth 2 (two) times daily with a meal. 90 tablet 3  . colchicine 0.6 MG tablet Take 1 tablet (0.6 mg total) by mouth 2 (two) times daily. Further refills need to be addressed by PCP. 60 tablet 0  . Cyanocobalamin (VITAMIN B12) 3000 MCG/ML LIQD Place 1 mL under the tongue daily.    . diclofenac Sodium (VOLTAREN) 1 % GEL Apply 2 g topically 4 (four) times daily.    . furosemide (LASIX) 40 MG tablet Take 1 tablet (40 mg total) by mouth daily. 90 tablet 3  . Garlic 10 MG CAPS Take 2 capsules by mouth in the morning and at bedtime.    . hydrALAZINE (APRESOLINE) 50 MG tablet Take 1.5 tablets (75 mg total) by mouth 3 (three) times daily. 405 tablet 3  . isosorbide dinitrate (ISORDIL) 40 MG tablet Take 1 tablet (40 mg total) by mouth 3 (three) times daily. 270 tablet 3  . Magnesium 500 MG TABS Take 3 tablets by mouth daily.    . Probiotic Product (PROBIOTIC PO) Take 1 capsule by mouth daily.    . TURMERIC PO Take 1 capsule by mouth every other day.    Marland Kitchen VITAMIN A PO Take 1 tablet by mouth every other day.  No current facility-administered medications for this visit.    Allergies  Allergen Reactions  . Diclofenac Swelling  . Morphine And Related     Patient reports "stomach tightness" in the past related to morphine use and does not remember what other reactions it caused.    Social History   Socioeconomic History  . Marital status: Single    Spouse name: Not on file  . Number of children: Not on file  . Years of education: Not on file  . Highest education level: Not on file  Occupational History  . Not on file  Tobacco Use  . Smoking status: Former Smoker    Types: Cigars  . Smokeless tobacco: Never Used  . Tobacco comment: Occasional cigars in the 1980s   Substance and Sexual Activity  . Alcohol use: Yes    Comment: occasional - just few drinks a month  . Drug use: Never  . Sexual activity: Not on file  Other Topics Concern  . Not on file  Social History Narrative  . Not on file   Social Determinants of Health   Financial Resource Strain:   . Difficulty of Paying Living Expenses: Not on file  Food Insecurity:   . Worried About Programme researcher, broadcasting/film/video in the Last Year: Not on file  . Ran Out of Food in the Last Year: Not on file  Transportation Needs:   . Lack of Transportation (Medical): Not on file  . Lack of Transportation (Non-Medical): Not on file  Physical Activity:   . Days of Exercise per Week: Not on file  . Minutes of Exercise per Session: Not on file  Stress:   . Feeling of Stress : Not on file  Social Connections:   . Frequency of Communication with Friends and Family: Not on file  . Frequency of Social Gatherings with Friends and Family: Not on file  . Attends Religious Services: Not on file  . Active Member of Clubs or Organizations: Not on file  . Attends Banker Meetings: Not on file  . Marital Status: Not on file  Intimate Partner Violence:   . Fear of Current or Ex-Partner: Not on file  . Emotionally Abused: Not on file  . Physically Abused: Not on file  . Sexually Abused: Not on file    Family History  Problem Relation Age of Onset  . Diabetes Father   . Diabetes Paternal Grandmother     Review of Systems:  As stated in the HPI and otherwise negative.   There were no vitals taken for this visit.  Physical Examination: General: Well developed, well nourished, NAD  HEENT: OP clear, mucus membranes moist  SKIN: warm, dry. No rashes. Neuro: No focal deficits  Musculoskeletal: Muscle strength 5/5 all ext  Psychiatric: Mood and affect normal  Neck: No JVD, no carotid bruits, no thyromegaly, no lymphadenopathy.  Lungs:Clear bilaterally, no wheezes, rhonci, crackles Cardiovascular: Regular  rate and rhythm. No murmurs, gallops or rubs. Abdomen:Soft. Bowel sounds present. Non-tender.  Extremities: No lower extremity edema. Pulses are 2 + in the bilateral DP/PT.  EKG:  EKG is not *** ordered today. The ekg ordered today demonstrates   Recent Labs: 09/21/2019: TSH 0.710 09/24/2019: ALT 12 09/25/2019: Magnesium 2.0 11/05/2019: B Natriuretic Peptide 99.7; BUN 31; Creatinine, Ser 1.76; Hemoglobin 14.7; Platelets 292; Potassium 4.7; Sodium 141   Lipid Panel    Component Value Date/Time   CHOL 150 09/22/2019 0556   TRIG 81 09/22/2019 0556   HDL 33 (  L) 09/22/2019 0556   CHOLHDL 4.5 09/22/2019 0556   VLDL 16 09/22/2019 0556   LDLCALC 101 (H) 09/22/2019 0556     Wt Readings from Last 3 Encounters:  12/03/19 (!) 305 lb 12.8 oz (138.7 kg)  10/18/19 (!) 301 lb (136.5 kg)  10/03/19 (!) 306 lb 7 oz (139 kg)    Assessment and Plan:   1. Chronic systolic CHF/NICM: He is doing well with stable weight and no LE edema. LV function is now normal. Will continue Coreg, Isordil and hydralazine.    2. Severe pulmonary HTN: Likely due to obesity hypoventilation syndrome and sleep apnea.  *** sleep study done ?    3. Morbid obesity: Weight loss is recommended.   4. CKD, stage III  Current medicines are reviewed at length with the patient today.  The patient does not have concerns regarding medicines.  The following changes have been made:  no change  Labs/ tests ordered today include:   No orders of the defined types were placed in this encounter.    Disposition:   FU with me in 3 months.    Signed, Verne Carrow, MD 03/26/2020 9:53 AM    [facility] [address] [place], [place], [place]  [address] Phone: [phone]; Fax: [phone]

## 2020-05-12 IMAGING — US US ABDOMEN LIMITED
1 series · 14 of 22 positions shown · non-contrast
Comparison: No prior.

CLINICAL DATA: Elevated LFTs.

EXAM:
ULTRASOUND ABDOMEN LIMITED RIGHT UPPER QUADRANT

[Series 1: us abdomen limited · 14 of 22 slices shown]
[im 1/22]
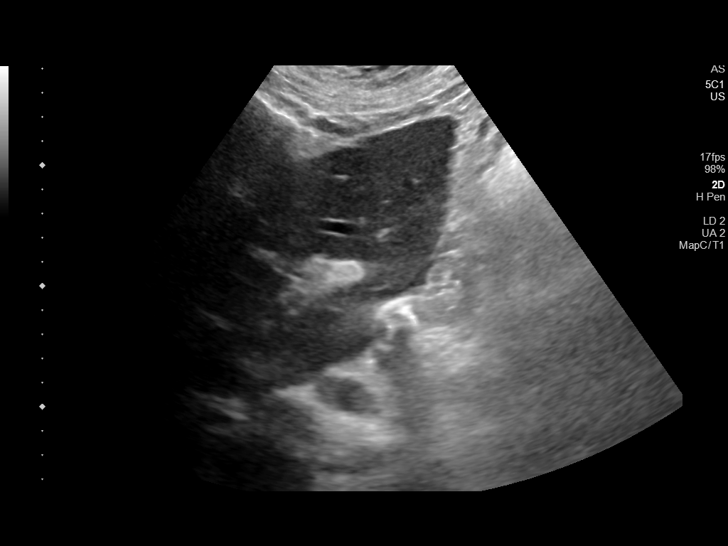
[im 3/22]
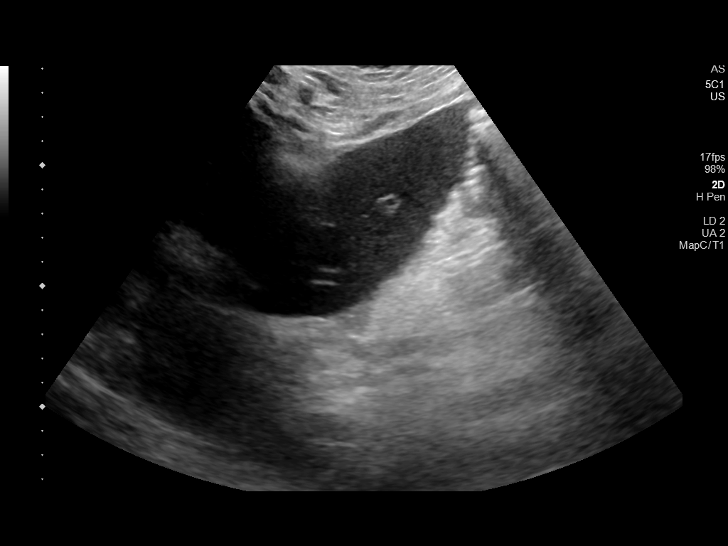
[im 4/22]
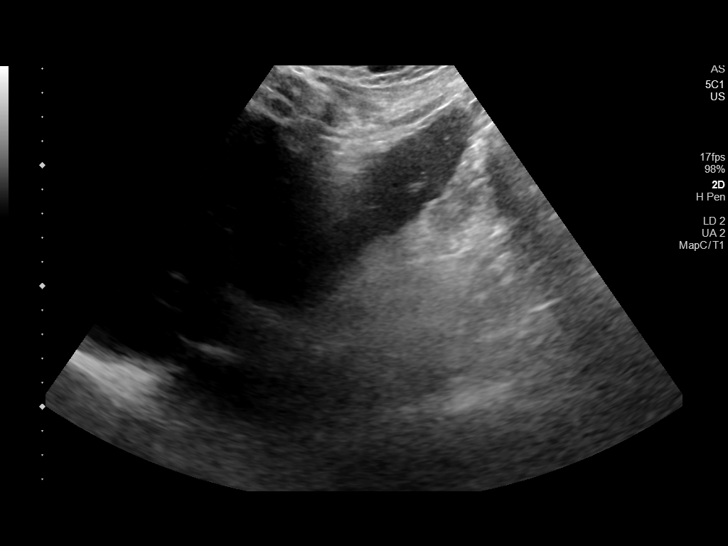
[im 6/22]
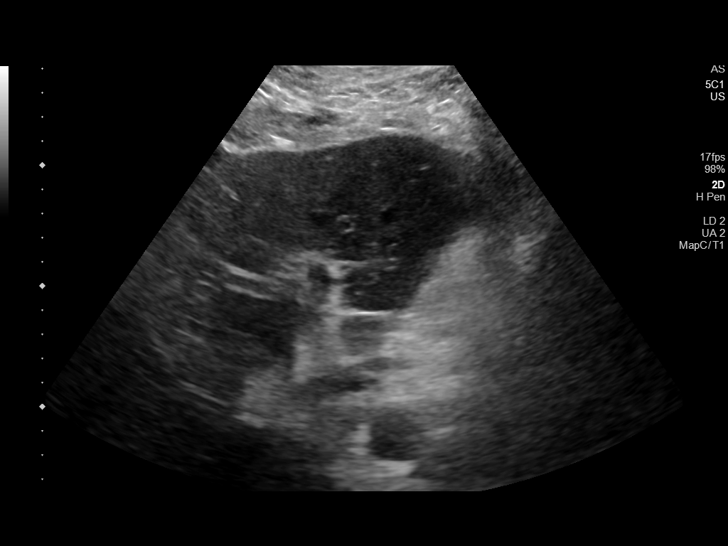
[im 8/22]
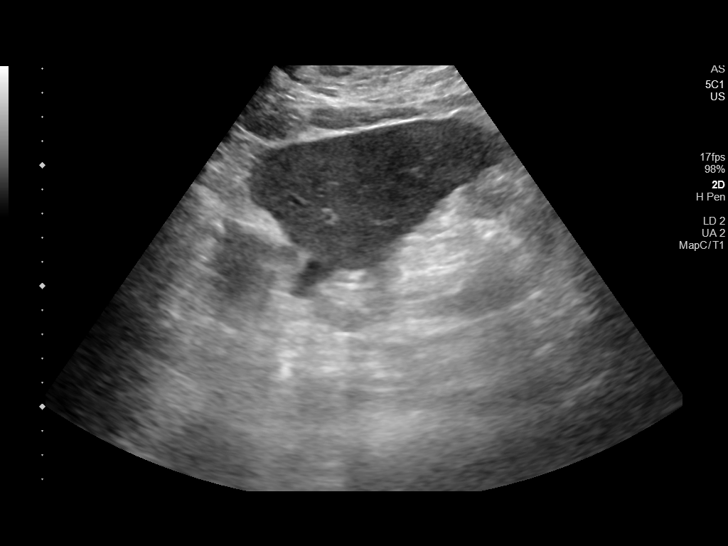
[im 9/22]
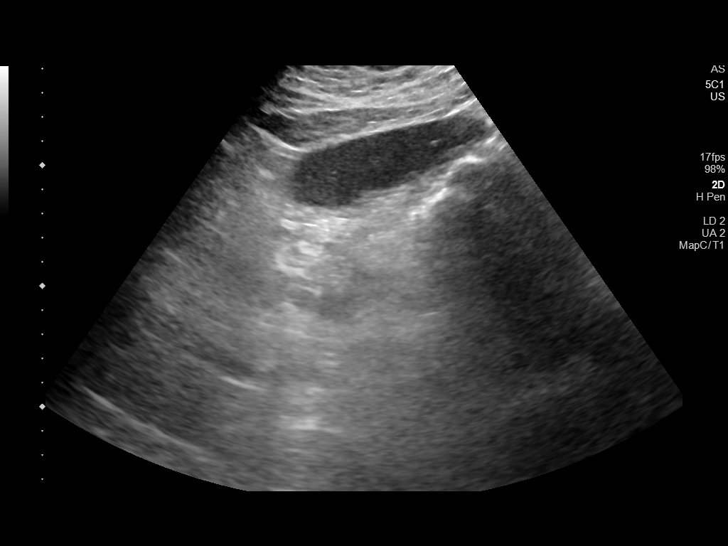
[im 11/22]
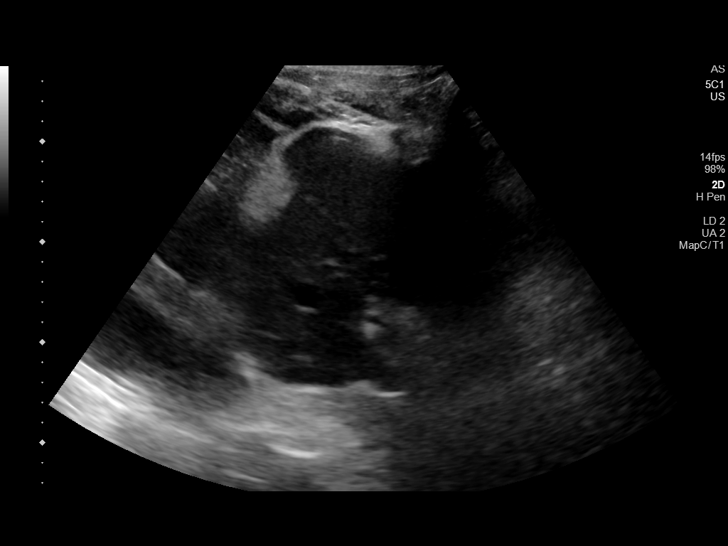
[im 12/22]
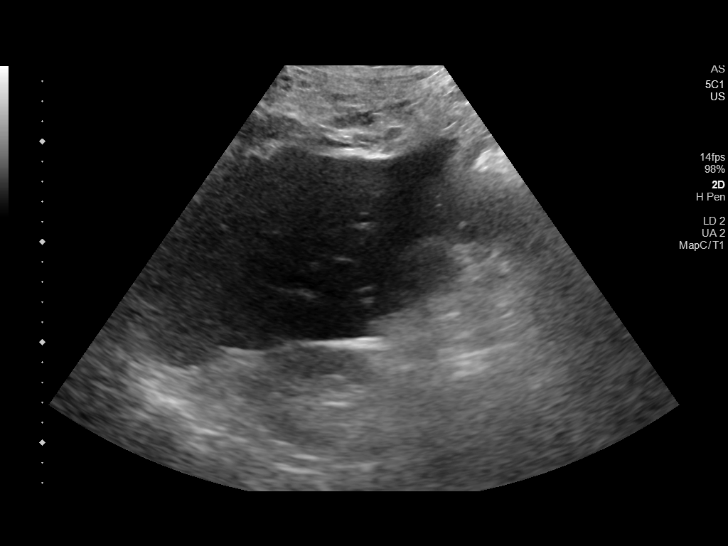
[im 14/22]
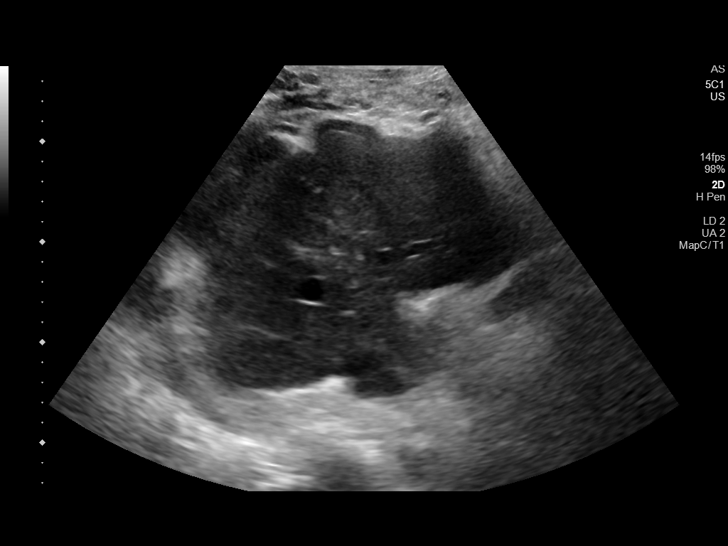
[im 15/22]
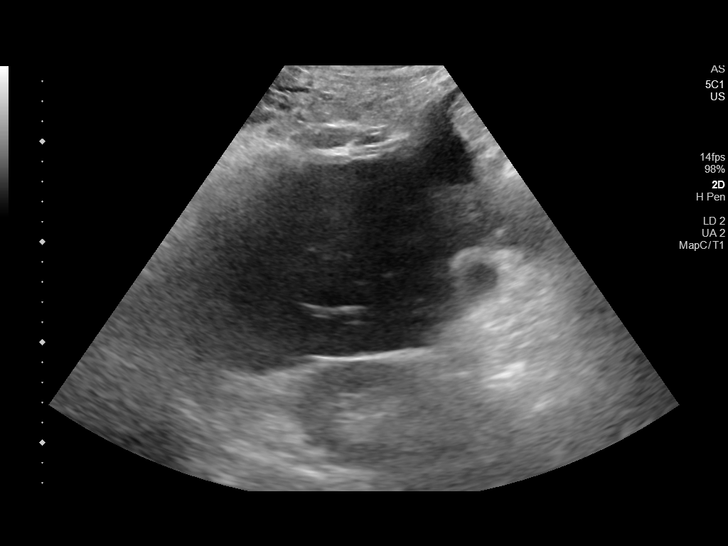
[im 17/22]
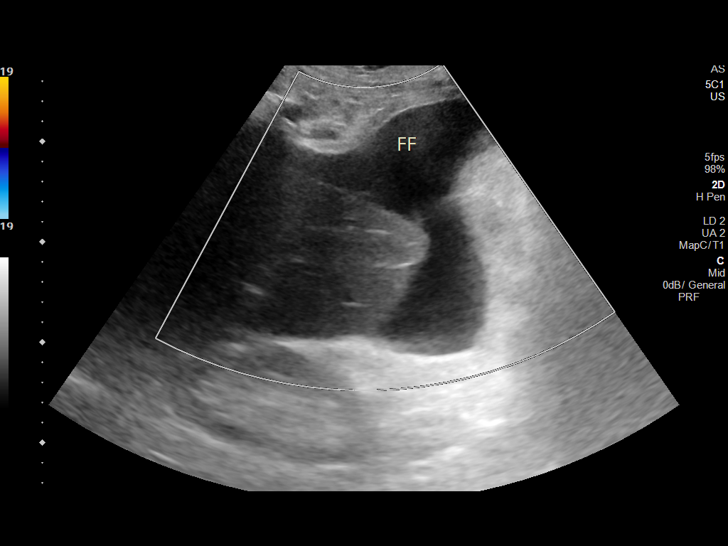
[im 19/22]
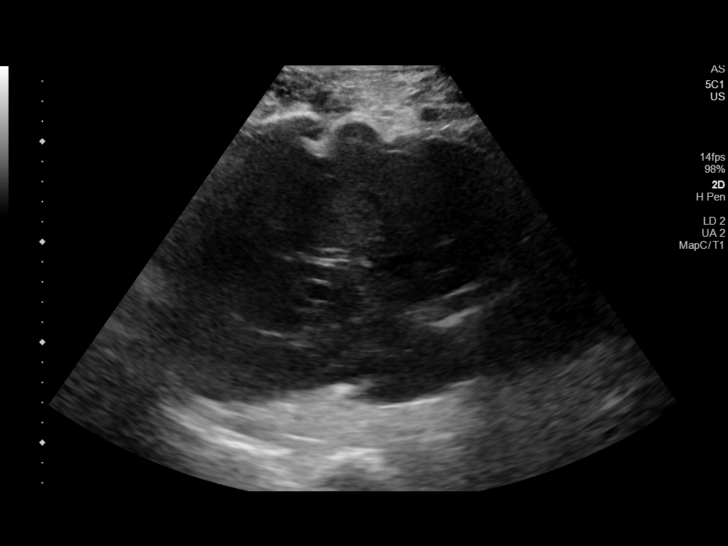
[im 20/22]
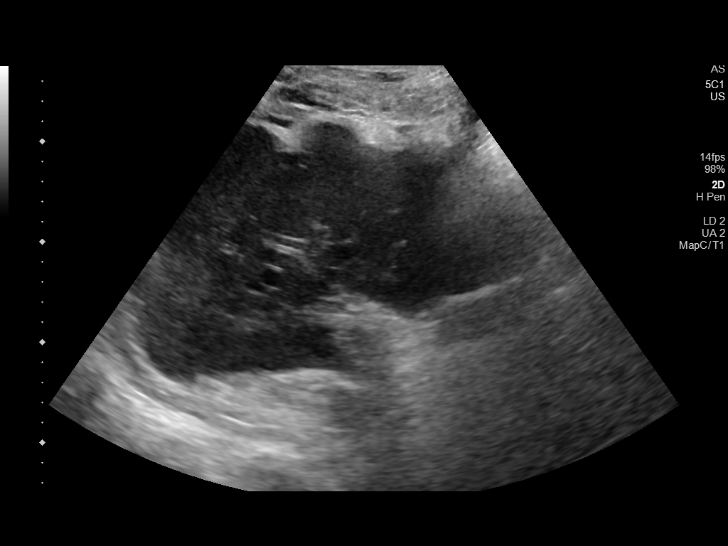
[im 22/22]
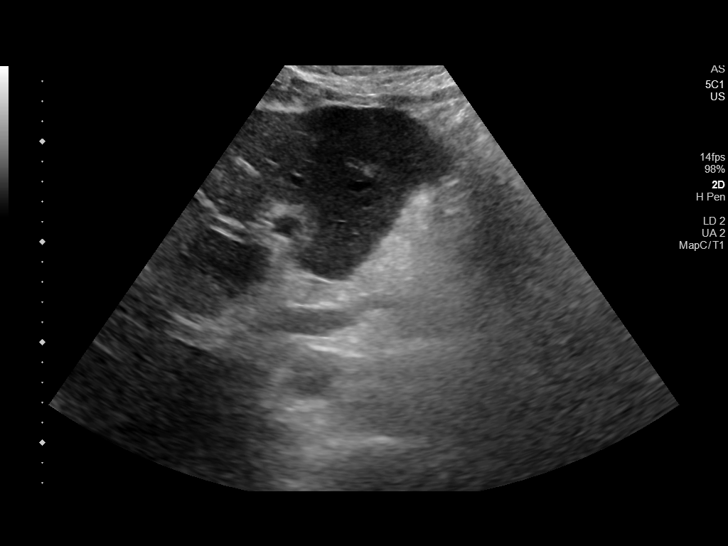

[14 of 22 positions shown; findings below may reference images not displayed]

FINDINGS: Gallbladder:

Not visualized

Common bile duct:

Diameter: Not visualized

Liver:

Nodular hepatic contour suggesting the possibility of cirrhosis. No
definite mass noted. Portal vein not well visualized.

Other: Moderate ascites. This exam was extremely limited due to
patient's body habitus and overlying bowel gas.
IMPRESSION: 1. Very limited exam due to patient's body habitus and overlying
bowel gas. Gallbladder and common bile duct not visualized.

2. Very nodular hepatic contour suggesting cirrhosis. Moderate
ascites.

## 2020-06-27 IMAGING — CR DG CHEST 2V
1 series · 1 of 1 positions shown · non-contrast
Comparison: 09/19/2019

CLINICAL DATA: Dizziness and hypotension

EXAM:
CHEST - 2 VIEW

[w chest lat]
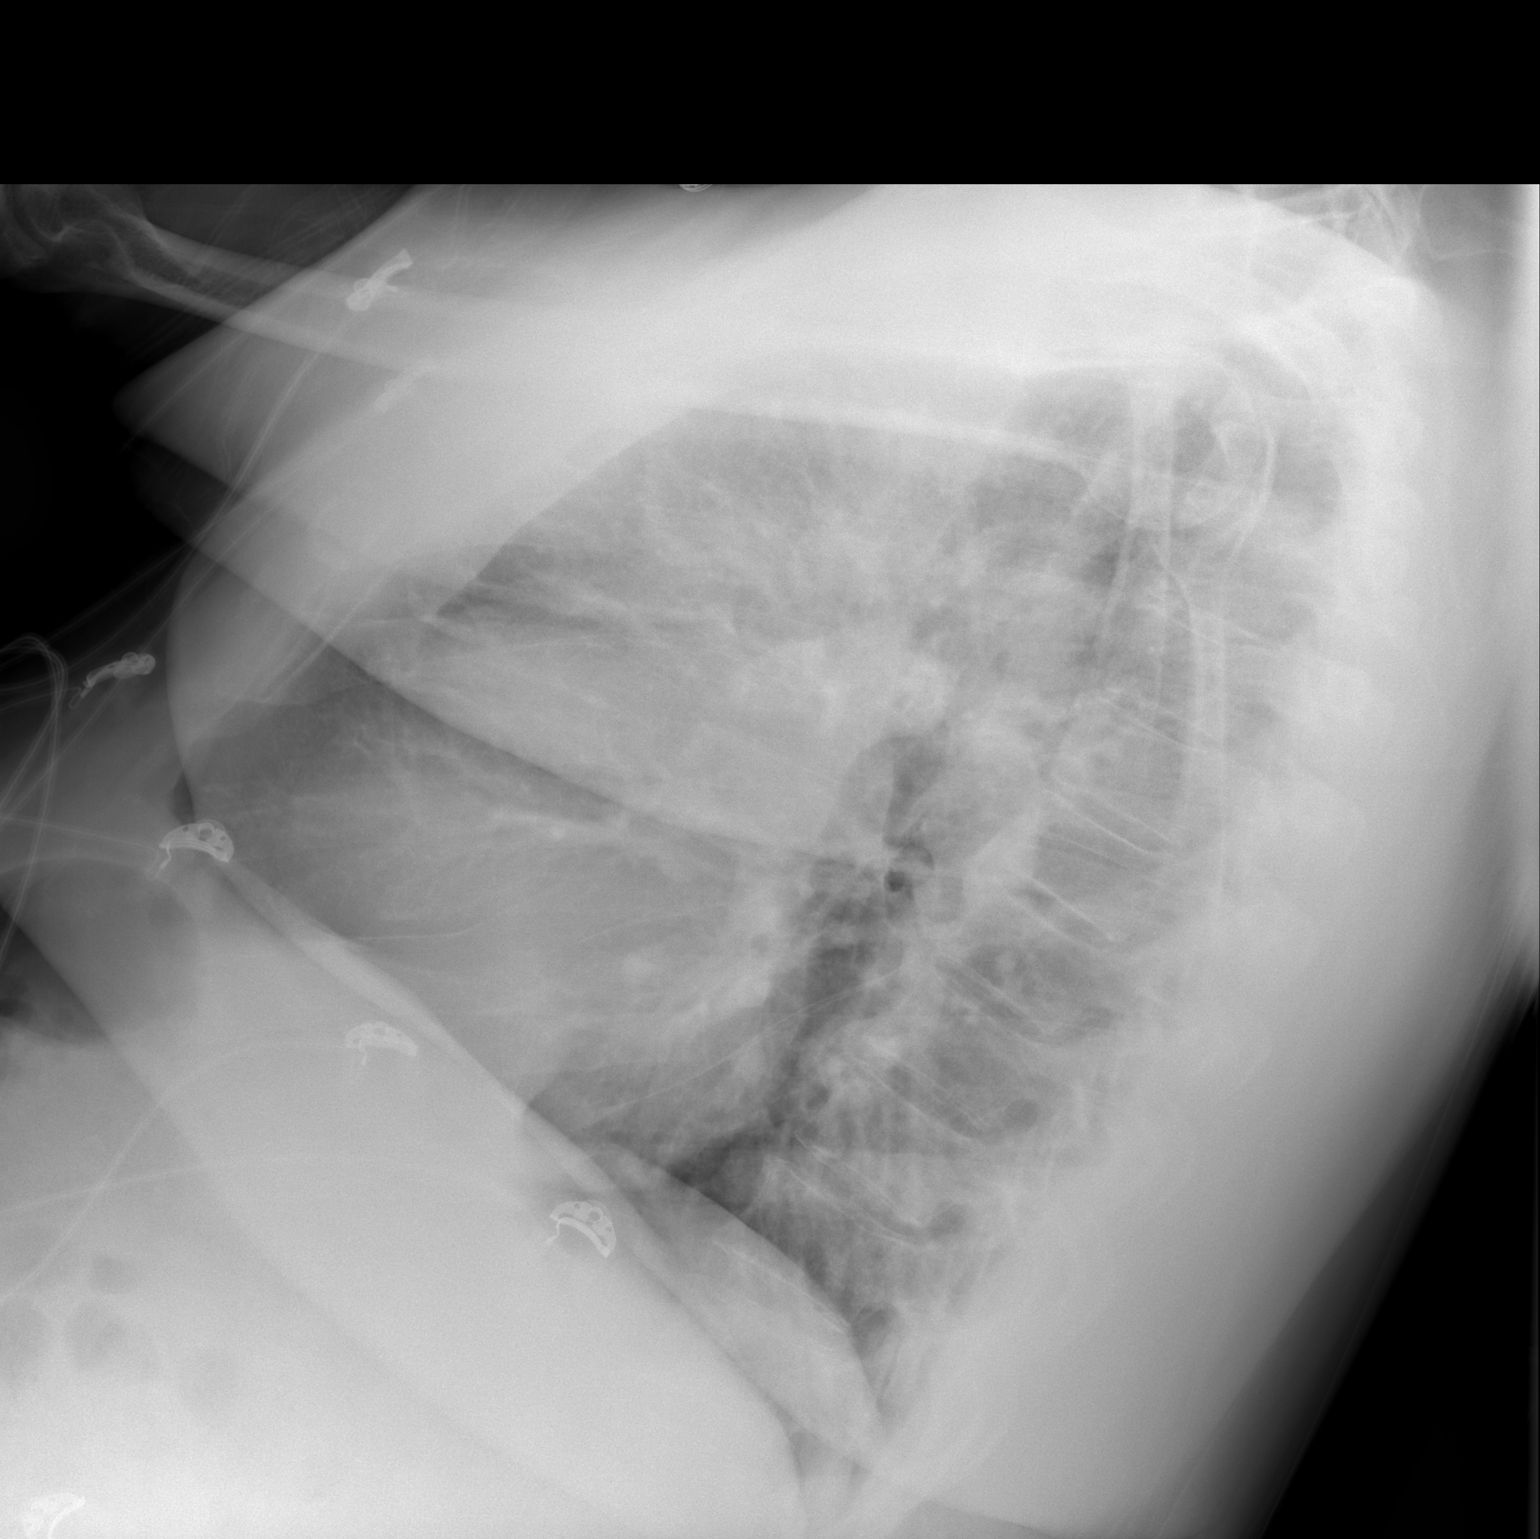

[1 of 1 positions shown; findings below may reference images not displayed]

FINDINGS: Cardiac shadow is stable. Previously seen right basilar effusion and
infiltrate as resolved in the interval. No focal abnormality is
seen. Degenerative change of the thoracic spine is noted.
IMPRESSION: Resolution of previously seen right-sided infiltrate and effusion.

## 2020-11-13 ENCOUNTER — Other Ambulatory Visit: Payer: Self-pay | Admitting: Cardiovascular Disease

## 2020-12-11 ENCOUNTER — Other Ambulatory Visit: Payer: Self-pay | Admitting: Cardiovascular Disease

## 2021-01-11 ENCOUNTER — Other Ambulatory Visit: Payer: Self-pay | Admitting: Cardiovascular Disease

## 2021-02-10 ENCOUNTER — Other Ambulatory Visit: Payer: Self-pay | Admitting: Cardiovascular Disease

## 2021-02-22 ENCOUNTER — Other Ambulatory Visit: Payer: Self-pay | Admitting: Cardiovascular Disease

## 2021-02-25 ENCOUNTER — Other Ambulatory Visit: Payer: Self-pay | Admitting: Cardiovascular Disease

## 2021-03-22 ENCOUNTER — Other Ambulatory Visit: Payer: Self-pay | Admitting: Cardiovascular Disease

## 2021-04-15 ENCOUNTER — Other Ambulatory Visit: Payer: Self-pay | Admitting: Cardiovascular Disease

## 2021-06-23 ENCOUNTER — Other Ambulatory Visit: Payer: Self-pay | Admitting: *Deleted

## 2021-06-23 MED ORDER — CARVEDILOL 25 MG PO TABS: 25.0000 mg | ORAL_TABLET | Freq: Two times a day (BID) | ORAL | 0 refills | Status: AC

## 2021-12-02 NOTE — Telephone Encounter (Signed)
error
